# Patient Record
Sex: Male | Born: 1950 | Race: White | Hispanic: No | Marital: Single | Smoking: Former smoker
Health system: Southern US, Community
[De-identification: ages and names within clinical notes are randomized; demographics above are authoritative.]

## PROBLEM LIST (undated history)

## (undated) DIAGNOSIS — R7303 Prediabetes: Secondary | ICD-10-CM

## (undated) DIAGNOSIS — M1990 Unspecified osteoarthritis, unspecified site: Secondary | ICD-10-CM

## (undated) DIAGNOSIS — R270 Ataxia, unspecified: Secondary | ICD-10-CM

## (undated) DIAGNOSIS — E785 Hyperlipidemia, unspecified: Secondary | ICD-10-CM

## (undated) DIAGNOSIS — G43909 Migraine, unspecified, not intractable, without status migrainosus: Secondary | ICD-10-CM

## (undated) DIAGNOSIS — E119 Type 2 diabetes mellitus without complications: Secondary | ICD-10-CM

## (undated) DIAGNOSIS — I10 Essential (primary) hypertension: Secondary | ICD-10-CM

## (undated) DIAGNOSIS — M542 Cervicalgia: Secondary | ICD-10-CM

## (undated) DIAGNOSIS — N189 Chronic kidney disease, unspecified: Secondary | ICD-10-CM

## (undated) DIAGNOSIS — H9190 Unspecified hearing loss, unspecified ear: Secondary | ICD-10-CM

## (undated) DIAGNOSIS — Z860100 Personal history of colon polyps, unspecified: Secondary | ICD-10-CM

## (undated) HISTORY — DX: Chronic kidney disease, unspecified: N18.9

## (undated) HISTORY — DX: Unspecified osteoarthritis, unspecified site: M19.90

## (undated) HISTORY — DX: Type 2 diabetes mellitus without complications: E11.9

## (undated) HISTORY — DX: Ataxia, unspecified: R27.0

## (undated) HISTORY — DX: Hyperlipidemia, unspecified: E78.5

## (undated) HISTORY — DX: Personal history of colonic polyps: Z86.010

## (undated) HISTORY — PX: CHOLECYSTECTOMY: SHX55

## (undated) HISTORY — DX: Migraine, unspecified, not intractable, without status migrainosus: G43.909

## (undated) HISTORY — DX: Personal history of colon polyps, unspecified: Z86.0100

---

## 2016-12-29 ENCOUNTER — Encounter (HOSPITAL_COMMUNITY): Payer: Self-pay | Admitting: Emergency Medicine

## 2016-12-29 ENCOUNTER — Emergency Department (HOSPITAL_COMMUNITY): Payer: Self-pay

## 2016-12-29 ENCOUNTER — Emergency Department (HOSPITAL_COMMUNITY)
Admission: EM | Admit: 2016-12-29 | Discharge: 2016-12-30 | Disposition: A | Discharge status: Home / Self Care | Payer: Self-pay | Attending: Emergency Medicine | Admitting: Emergency Medicine

## 2016-12-29 DIAGNOSIS — Y9289 Other specified places as the place of occurrence of the external cause: Secondary | ICD-10-CM | POA: Insufficient documentation

## 2016-12-29 DIAGNOSIS — W109XXA Fall (on) (from) unspecified stairs and steps, initial encounter: Secondary | ICD-10-CM | POA: Insufficient documentation

## 2016-12-29 DIAGNOSIS — S43004A Unspecified dislocation of right shoulder joint, initial encounter: Secondary | ICD-10-CM

## 2016-12-29 DIAGNOSIS — Y999 Unspecified external cause status: Secondary | ICD-10-CM | POA: Insufficient documentation

## 2016-12-29 DIAGNOSIS — S43014A Anterior dislocation of right humerus, initial encounter: Secondary | ICD-10-CM | POA: Insufficient documentation

## 2016-12-29 DIAGNOSIS — R52 Pain, unspecified: Secondary | ICD-10-CM

## 2016-12-29 DIAGNOSIS — Y939 Activity, unspecified: Secondary | ICD-10-CM | POA: Insufficient documentation

## 2016-12-29 MED ORDER — BUPIVACAINE HCL (PF) 0.25 % IJ SOLN
10.0000 mL | Freq: Once | INTRAMUSCULAR | Status: AC
  Administered 2016-12-29: 10 mL
  Filled 2016-12-29: qty 30

## 2016-12-29 MED ORDER — FENTANYL CITRATE (PF) 100 MCG/2ML IJ SOLN
100.0000 ug | Freq: Once | INTRAMUSCULAR | Status: AC
  Administered 2016-12-29: 100 ug via INTRAVENOUS
  Filled 2016-12-29: qty 2

## 2016-12-29 MED ORDER — FENTANYL CITRATE (PF) 100 MCG/2ML IJ SOLN
50.0000 ug | Freq: Once | INTRAMUSCULAR | Status: AC
  Administered 2016-12-29: 50 ug via INTRAVENOUS
  Filled 2016-12-29: qty 2

## 2016-12-29 MED ORDER — LIDOCAINE HCL (PF) 1 % IJ SOLN
10.0000 mL | Freq: Once | INTRAMUSCULAR | Status: AC
  Administered 2016-12-29: 10 mL via INTRADERMAL
  Filled 2016-12-29: qty 10

## 2016-12-29 NOTE — ED Triage Notes (Signed)
Patient fell this evening onto concrete floor.  He did hit his head, no LOC, full recall.  No dizziness before the fall, he missed the last step on the stair case.  Patient has abrasion on his forehead.  Right shoulder is dislocated with pain.

## 2016-12-29 NOTE — ED Notes (Signed)
ED Provider at bedside. 

## 2016-12-30 MED ORDER — HYDROCODONE-ACETAMINOPHEN 5-325 MG PO TABS: 1.0000 | ORAL_TABLET | Freq: Four times a day (QID) | ORAL | 0 refills | Status: DC | PRN

## 2016-12-30 NOTE — ED Provider Notes (Signed)
[facility] DEPT Provider Note   CSN: [id number] Arrival date & time: 12/29/16  2000     History   Chief Complaint Chief Complaint  Patient presents with  . Shoulder Pain    HPI Austin Richards is a 66 y.o. male.  HPI Patient presents to the emergency department with right shoulder injury following a fall.  The patient missed a step and fell onto his right shoulder.  Patient did not have any other injuries.  Patient did not lose consciousness.  This was a witnessed fall by a family member.  Patient did not take any medications prior to arrival.  Patient states that movement and palpation make the pain worse History reviewed. No pertinent past medical history.  There are no active problems to display for this patient.   Past Surgical History:  Procedure Laterality Date  . CHOLECYSTECTOMY         Home Medications    Prior to Admission medications   Not on File    Family History History reviewed. No pertinent family history.  Social History Social History  Substance Use Topics  . Smoking status: Never Smoker  . Smokeless tobacco: Never Used  . Alcohol use Not on file     Allergies   Codeine and Penicillins   Review of Systems Review of Systems All other systems negative except as documented in the HPI. All pertinent positives and negatives as reviewed in the HPI.  Physical Exam Updated Vital Signs BP 136/80   Pulse 85   Temp 98 F (36.7 C) (Oral)   Resp 18   SpO2 98%   Physical Exam  Constitutional: He is oriented to person, place, and time. He appears well-developed and well-nourished. No distress.  HENT:  Head: Normocephalic and atraumatic.  Eyes: Pupils are equal, round, and reactive to light.  Pulmonary/Chest: Effort normal.  Musculoskeletal:       Right shoulder: He exhibits decreased range of motion, tenderness, bony tenderness and deformity.  Neurological: He is alert and oriented to person, place, and time.  Skin: Skin is  warm and dry.  Psychiatric: He has a normal mood and affect.  Nursing note and vitals reviewed.    ED Treatments / Results  Labs (all labs ordered are listed, but only abnormal results are displayed) Labs Reviewed - No data to display  EKG  EKG Interpretation None       Radiology Dg Shoulder Right  Result Date: 12/29/2016 CLINICAL DATA:  [place], limited range of motion with pain EXAM: RIGHT SHOULDER - 2+ VIEW COMPARISON:  None. FINDINGS: The right lung apex is clear. The [facility] joint appears intact. There is anterior dislocation of the right humeral head with respect glenoid fossa. There is no obvious fracture. IMPRESSION: Anterior, inferior dislocation of the right humeral head with respect to the glenoid fossa Electronically Signed   By: Donavan Foil M.D.   On: 12/29/2016 21:47   Dg Shoulder Right Portable  Result Date: 12/29/2016 CLINICAL DATA:  Post reduction of right humeral head dislocation EXAM: PORTABLE RIGHT SHOULDER COMPARISON:  12/29/2016 FINDINGS: Satisfactory reduction of the dislocated humeral head is now noted. The humeral head articulates with the glenoid. There is a small triangular ossific density off the undersurface of the acromion suspicious for minimally displaced fracture. IMPRESSION: Satisfactory reduction of the dislocated right humeral head. Small triangular ossific density adjacent to the undersurface of the acromion is noted which may reflect a tiny fracture fragment possibly off the acromion. Electronically Signed   By: Shanon Brow  Randel Pigg M.D.   On: 12/29/2016 23:39    Procedures Procedures (including critical care time)  Medications Ordered in ED Medications  fentaNYL (SUBLIMAZE) injection 100 mcg (100 mcg Intravenous Given 12/29/16 2254)  bupivacaine (PF) (MARCAINE) 0.25 % injection 10 mL (10 mLs Infiltration Given by Other 12/29/16 2255)  lidocaine (PF) (XYLOCAINE) 1 % injection 10 mL (10 mLs Intradermal Given by Other 12/29/16 2254)  fentaNYL (SUBLIMAZE) injection 50  mcg (50 mcg Intravenous Given 12/29/16 2311)     Initial Impression / Assessment and Plan / ED Course  I have reviewed the triage vital signs and the nursing notes.  Pertinent labs & imaging results that were available during my care of the patient were reviewed by me and considered in my medical decision making (see chart for details).    Reduction of dislocation Date/Time: 12:12 AM Performed by: Brent General Authorized by: Brent General Consent: Verbal consent obtained. Risks and benefits: risks, benefits and alternatives were discussed Consent given by: patient Required items: required blood products, implants, devices, and special equipment available Time out: Immediately prior to procedure a "time out" was called to verify the correct patient, procedure, equipment, support staff and site/side marked as required.  Patient sedated: Joint injection of Marcaine and lidocaine  Vitals: Vital signs were monitored during sedation. Patient tolerance: Patient tolerated the procedure well with no immediate complications. Joint: Right shoulder Reduction technique: Traction and external rotation     Final Clinical Impressions(s) / ED Diagnoses   Final diagnoses:  Pain    New Prescriptions New Prescriptions   No medications on file     Dalia Heading, PA-C 12/30/16 0013    Duffy Bruce, MD 12/30/16 1606

## 2016-12-30 NOTE — Discharge Instructions (Signed)
Follow-up with the orthopedist provided.  Return here as needed °

## 2017-02-18 DIAGNOSIS — M25511 Pain in right shoulder: Secondary | ICD-10-CM | POA: Diagnosis not present

## 2017-02-18 DIAGNOSIS — S43004A Unspecified dislocation of right shoulder joint, initial encounter: Secondary | ICD-10-CM | POA: Diagnosis not present

## 2017-03-19 ENCOUNTER — Other Ambulatory Visit: Payer: Self-pay | Admitting: Surgery

## 2017-03-19 DIAGNOSIS — S43014D Anterior dislocation of right humerus, subsequent encounter: Secondary | ICD-10-CM | POA: Diagnosis not present

## 2017-03-19 DIAGNOSIS — M75121 Complete rotator cuff tear or rupture of right shoulder, not specified as traumatic: Secondary | ICD-10-CM | POA: Insufficient documentation

## 2017-04-09 ENCOUNTER — Ambulatory Visit
Admission: RE | Admit: 2017-04-09 | Discharge: 2017-04-09 | Disposition: A | Discharge status: Home / Self Care | Payer: Medicare Other | Source: Ambulatory Visit | Attending: Surgery | Admitting: Surgery

## 2017-04-09 DIAGNOSIS — S43014D Anterior dislocation of right humerus, subsequent encounter: Secondary | ICD-10-CM | POA: Insufficient documentation

## 2017-04-09 DIAGNOSIS — M75121 Complete rotator cuff tear or rupture of right shoulder, not specified as traumatic: Secondary | ICD-10-CM | POA: Insufficient documentation

## 2017-04-09 DIAGNOSIS — M75101 Unspecified rotator cuff tear or rupture of right shoulder, not specified as traumatic: Secondary | ICD-10-CM | POA: Diagnosis not present

## 2017-04-09 DIAGNOSIS — X58XXXD Exposure to other specified factors, subsequent encounter: Secondary | ICD-10-CM | POA: Insufficient documentation

## 2017-04-20 DIAGNOSIS — M7581 Other shoulder lesions, right shoulder: Secondary | ICD-10-CM | POA: Insufficient documentation

## 2017-07-30 ENCOUNTER — Telehealth: Payer: Self-pay | Admitting: Family Medicine

## 2017-07-30 ENCOUNTER — Ambulatory Visit (INDEPENDENT_AMBULATORY_CARE_PROVIDER_SITE_OTHER): Payer: Medicare Other | Admitting: Family Medicine

## 2017-07-30 ENCOUNTER — Encounter: Payer: Self-pay | Admitting: Family Medicine

## 2017-07-30 VITALS — BP 140/90 | HR 88 | Temp 98.2°F | Ht 67.0 in | Wt 175.6 lb

## 2017-07-30 DIAGNOSIS — E781 Pure hyperglyceridemia: Secondary | ICD-10-CM

## 2017-07-30 DIAGNOSIS — E119 Type 2 diabetes mellitus without complications: Secondary | ICD-10-CM | POA: Diagnosis not present

## 2017-07-30 DIAGNOSIS — G8929 Other chronic pain: Secondary | ICD-10-CM

## 2017-07-30 DIAGNOSIS — M25511 Pain in right shoulder: Secondary | ICD-10-CM | POA: Diagnosis not present

## 2017-07-30 DIAGNOSIS — R030 Elevated blood-pressure reading, without diagnosis of hypertension: Secondary | ICD-10-CM

## 2017-07-30 DIAGNOSIS — R7303 Prediabetes: Secondary | ICD-10-CM | POA: Insufficient documentation

## 2017-07-30 DIAGNOSIS — R270 Ataxia, unspecified: Secondary | ICD-10-CM

## 2017-07-30 DIAGNOSIS — I10 Essential (primary) hypertension: Secondary | ICD-10-CM | POA: Insufficient documentation

## 2017-07-30 DIAGNOSIS — E785 Hyperlipidemia, unspecified: Secondary | ICD-10-CM

## 2017-07-30 DIAGNOSIS — M542 Cervicalgia: Secondary | ICD-10-CM | POA: Diagnosis not present

## 2017-07-30 LAB — COMPREHENSIVE METABOLIC PANEL
ALBUMIN: 4.6 g/dL (ref 3.5–5.2)
ALT: 35 U/L (ref 0–53)
AST: 23 U/L (ref 0–37)
Alkaline Phosphatase: 82 U/L (ref 39–117)
BUN: 22 mg/dL (ref 6–23)
CHLORIDE: 101 meq/L (ref 96–112)
CO2: 26 meq/L (ref 19–32)
Calcium: 9.8 mg/dL (ref 8.4–10.5)
Creatinine, Ser: 1.26 mg/dL (ref 0.40–1.50)
GFR: 61.05 mL/min (ref >60.00)
Glucose, Bld: 104 mg/dL — ABNORMAL HIGH (ref 70–99)
POTASSIUM: 4.1 meq/L (ref 3.5–5.1)
SODIUM: 136 meq/L (ref 135–145)
Total Bilirubin: 1.3 mg/dL — ABNORMAL HIGH (ref 0.2–1.2)
Total Protein: 7.3 g/dL (ref 6.0–8.3)

## 2017-07-30 LAB — LDL CHOLESTEROL, DIRECT: Direct LDL: 54 mg/dL

## 2017-07-30 LAB — LIPID PANEL
CHOL/HDL RATIO: 11
CHOLESTEROL: 314 mg/dL — AB (ref 0–200)
HDL: 28.8 mg/dL — ABNORMAL LOW (ref >39.00)
Triglycerides: 1591 mg/dL — ABNORMAL HIGH (ref 0.0–149.0)

## 2017-07-30 LAB — MICROALBUMIN / CREATININE URINE RATIO
CREATININE, U: 123.1 mg/dL
MICROALB UR: 2 mg/dL — AB (ref 0.0–1.9)
MICROALB/CREAT RATIO: 1.6 mg/g (ref 0.0–30.0)

## 2017-07-30 LAB — HEMOGLOBIN A1C: HEMOGLOBIN A1C: 5.8 % (ref 4.6–6.5)

## 2017-07-30 NOTE — Assessment & Plan Note (Signed)
Offered physical therapy. Patient declined Prior MRI reviewed. This will be scanned into chart. Patient will monitor.

## 2017-07-30 NOTE — Assessment & Plan Note (Signed)
Potentially has a history of hypertension though unsure. Elevated today. He'll check at home daily and return in 1 week for BP check with nursing. Consider starting medication if still elevated.

## 2017-07-30 NOTE — Telephone Encounter (Signed)
Attempted to contact both numbers listed. Patient's triglycerides are quite elevated. I wanted to discuss treatment with fenofibrate. We'll attempt to call tomorrow.

## 2017-07-30 NOTE — Patient Instructions (Signed)
Nice to see you We will refer you to orthopedics and neurology. We'll check lab work today and contact you with the results. Please start checking your blood pressure at home and return in 1 week for repeat blood pressure check.

## 2017-07-30 NOTE — Assessment & Plan Note (Signed)
Patient with ataxia issues. Has undergone evaluation through neurology. This is done 4 years ago. We'll get him set up with neurology here to see if any further evaluation is needed.

## 2017-07-30 NOTE — Progress Notes (Signed)
Tommi Rumps, MD Phone: [phone]  Austin Richards is a 66 y.o. male who presents today for new patient visit.  Balance issues: Patient has had ataxia issues for greater than 5 years. Was evaluated by his prior PCP with MRI brain that the patient reports was normal. He saw neurology and had EMG studies done. These results will be scanned into the chart. They did reveal abnormalities consistent with acute right L5 and S1 radiculopathy. Also L5 and S1 on the left as well. 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 He walks with a broad-based gait.  Patient has chronic neck pain on the right side. Bothers him when he rotates it. Feels like a pinch in his neck. No numbness or weakness.  Patient notes right shoulder issues. He dislocated it a number of months ago has been followed by orthopedics who are recommending surgery. The patient and his sister are requesting a second opinion.  Patient also possibly has a history of diabetes, hyperlipidemia, and hypertension for which he was on medication previously though has been off of medication for some time now.  Active Ambulatory Problems    Diagnosis Date Noted  . Diabetes mellitus without complication ([place]) 99/35/7017  . Hyperlipidemia 07/30/2017  . 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 Right shoulder pain 07/30/2017  . Chronic neck pain 07/30/2017  . Elevated BP without diagnosis of hypertension 07/30/2017   Resolved Ambulatory Problems    Diagnosis Date Noted  . No Resolved Ambulatory Problems   Past Medical History:  Diagnosis Date  . Arthritis   . Chronic kidney disease   . Diabetes mellitus without complication ([place])   . Hx of colonic polyp   . Hyperlipidemia   . Migraine     Family History  Problem Relation Age of Onset  . Adopted: Yes    Social History   Social History  . Marital status: Single    Spouse name: N/A  . Number of children:  N/A  . Years of education: N/A   Occupational History  . Not on file.   Social History Main Topics  . Smoking status: Never Smoker  . Smokeless tobacco: Never Used  . Alcohol use No  . Drug use: No  . Sexual activity: Not on file   Other Topics Concern  . Not on file   Social History Narrative  . No narrative on file    ROS  General:  Negative for nexplained weight loss, fever Skin: Negative for new or changing mole, sore that won't heal HEENT: Negative for trouble hearing, trouble seeing, ringing in ears, mouth sores, hoarseness, change in voice, dysphagia. CV:  Negative for chest pain, dyspnea, edema, palpitations Resp: Negative for cough, dyspnea, hemoptysis GI: Negative for nausea, vomiting, diarrhea, constipation, abdominal pain, melena, hematochezia. GU: Negative for dysuria, incontinence, urinary hesitance, hematuria, vaginal or penile discharge, polyuria, sexual difficulty, lumps in testicle or breasts MSK: Negative for muscle cramps or aches, joint pain or swelling Neuro: Negative for headaches, weakness, numbness, dizziness, passing out/fainting Psych: Negative for depression, anxiety, memory problems  Objective  Physical Exam Vitals:   07/30/17 1426 07/30/17 1505  BP: (!) 152/80 140/90  Pulse: 88   Temp: 98.2 F (36.8 C)   SpO2: 98%     BP Readings from Last 3 Encounters:  07/30/17 140/90  12/30/16 123/71   Wt Readings from Last 3 Encounters:  07/30/17 175 lb 9.6 oz (79.7 kg)    Physical Exam  Constitutional:  No distress.  HENT:  Head: Normocephalic and atraumatic.  Mouth/Throat: Oropharynx is clear and moist. No oropharyngeal exudate.  Eyes: Pupils are equal, round, and reactive to light. Conjunctivae are normal.  Neck: Normal range of motion.  Cardiovascular: Normal rate, regular rhythm and normal heart sounds.   Pulmonary/Chest: Effort normal and breath sounds normal.  Abdominal: Soft. Bowel sounds are normal. He exhibits no distension.  There is no tenderness. There is no rebound and no guarding.  Musculoskeletal: He exhibits no edema.  Neurological: He is alert.  CN 2-12 intact, 5/5 strength in bilateral biceps, triceps, grip, quads, hamstrings, plantar and dorsiflexion, sensation to light touch intact in bilateral UE and LE, broad-based gait  Skin: Skin is warm and dry. He is not diaphoretic.  Psychiatric: Mood and affect normal.   decreased range of motion right shoulder on abduction, internal rotation, and external rotation, discomfort in all of these planes of motion, mild tenderness posterior right shoulder, left shoulder with full range of motion with no discomfort   Assessment/Plan:   Diabetes mellitus without complication ([place]) History of this. Check A1c.  Ataxia Patient with ataxia issues. Has undergone evaluation through neurology. This is done 4 years ago. We'll get him set up with neurology here to see if any further evaluation is needed.  Hyperlipidemia Check lipid panel.  Right shoulder pain Recent history of dislocation. MRI with complete tear of supraspinatus and infraspinatus tendons. We'll refer to orthopedics in [place] for second opinion.  Chronic neck pain Offered physical therapy. Patient declined Prior MRI reviewed. This will be scanned into chart. Patient will monitor.  Elevated BP without diagnosis of hypertension Potentially has a history of hypertension though unsure. Elevated today. He'll check at home daily and return in 1 week for BP check with nursing. Consider starting medication if still elevated.   Orders Placed This Encounter  Procedures  . Comp Met (CMET)  . Lipid Profile  . HgB A1c  . Urine Microalbumin w/creat. ratio  . Ambulatory referral to Neurology    Referral Priority:   Routine    Referral Type:   Consultation    Referral Reason:   Specialty Services Required    Requested Specialty:   Neurology    Number of Visits Requested:   1  . Ambulatory referral to  Orthopedic Surgery    Referral Priority:   Routine    Referral Type:   Surgical    Referral Reason:   Specialty Services Required    Requested Specialty:   Orthopedic Surgery    Number of Visits Requested:   1    No orders of the defined types were placed in this encounter.    Tommi Rumps, MD [place]

## 2017-07-30 NOTE — Assessment & Plan Note (Signed)
History of this. Check A1c.

## 2017-07-30 NOTE — Assessment & Plan Note (Signed)
Check lipid panel  

## 2017-07-30 NOTE — Assessment & Plan Note (Signed)
Recent history of dislocation. MRI with complete tear of supraspinatus and infraspinatus tendons. We'll refer to orthopedics in [place] for second opinion.

## 2017-07-31 NOTE — Telephone Encounter (Signed)
Attempted to contact again. No answer. Will call on Monday. Triglycerides quite elevated. Patient would benefit from treatment with fenofibrate. Bilirubin slightly elevated as well. Needs to be rechecked in several weeks. A1c is in the prediabetic range. Needs to work on diet and exercise. Please inform the patient of this. Thanks.

## 2017-08-02 ENCOUNTER — Telehealth: Payer: Self-pay | Admitting: Family Medicine

## 2017-08-02 MED ORDER — FENOFIBRATE 48 MG PO TABS: 48.0000 mg | ORAL_TABLET | Freq: Every day | ORAL | 1 refills | Status: DC

## 2017-08-02 NOTE — Telephone Encounter (Signed)
Sent to pharmacy 

## 2017-08-02 NOTE — Telephone Encounter (Signed)
Patient advised of below , lab appointment scheduled.   Would like to try fenofibrate.

## 2017-08-02 NOTE — Telephone Encounter (Signed)
Copied from [place] [id number]. Topic: Quick Communication - Lab Results >> Aug 02, 2017 11:26 AM Johna Sheriff, CMA wrote: Called patient to inform them of  lab results. When patient returns call, triage nurse not disclose results. Route call to me please . Thanks.

## 2017-08-02 NOTE — Addendum Note (Signed)
Addended by: Leone Haven on: 08/02/2017 03:22 PM   Modules accepted: Orders

## 2017-08-10 ENCOUNTER — Telehealth: Payer: Self-pay

## 2017-08-10 ENCOUNTER — Ambulatory Visit (INDEPENDENT_AMBULATORY_CARE_PROVIDER_SITE_OTHER): Payer: Medicare Other

## 2017-08-10 ENCOUNTER — Ambulatory Visit: Payer: Medicare Other

## 2017-08-10 DIAGNOSIS — R030 Elevated blood-pressure reading, without diagnosis of hypertension: Secondary | ICD-10-CM

## 2017-08-10 DIAGNOSIS — M25511 Pain in right shoulder: Secondary | ICD-10-CM | POA: Diagnosis not present

## 2017-08-10 NOTE — Progress Notes (Addendum)
Patient comes in today for blood pressure check. Patient was seen by Dr.Sonnenebrg on 07/30/2017 and had a blood pressure of 140/90 and pulse of 88.Patient brought in readings for Dr.Sonnenebrg to review.Patients blood pressure today in left arm is 122/80 and pulse of 83.  Reviewed above information.  Blood pressure improved. Continue to follow.    Dr Nicki Reaper

## 2017-08-10 NOTE — Telephone Encounter (Signed)
Patient came in today for blood pressure check and would like to know if he you could add labs to check for MS and other involuntary muscle diseases to his labs on December 5th, patient unsure of name of disease. Patient just wants this for screening he reports no symptoms of this.Patient was adopted and is unsure if there is any family history of this. Patient aware Dr.Sonnenebrg is out of the office.

## 2017-08-24 NOTE — Telephone Encounter (Signed)
There are no recommended screening tests for those issues. If he is not having symptoms we would not evaluate for this. Thanks.

## 2017-08-25 NOTE — Telephone Encounter (Signed)
Left message to return call, ok for PEC to speak to patient or sister to inform her of message below

## 2017-08-26 ENCOUNTER — Ambulatory Visit: Payer: Medicare Other | Admitting: Neurology

## 2017-08-30 NOTE — Telephone Encounter (Signed)
Unable to reach patient.

## 2017-09-01 ENCOUNTER — Other Ambulatory Visit: Payer: Self-pay | Admitting: Family Medicine

## 2017-09-01 ENCOUNTER — Other Ambulatory Visit (INDEPENDENT_AMBULATORY_CARE_PROVIDER_SITE_OTHER): Payer: Medicare Other

## 2017-09-01 DIAGNOSIS — E781 Pure hyperglyceridemia: Secondary | ICD-10-CM

## 2017-09-01 DIAGNOSIS — Z5181 Encounter for therapeutic drug level monitoring: Secondary | ICD-10-CM

## 2017-09-01 LAB — LIPID PANEL
Cholesterol: 243 mg/dL — ABNORMAL HIGH (ref 0–200)
HDL: 34.5 mg/dL — AB (ref >39.00)
Total CHOL/HDL Ratio: 7
Triglycerides: 648 mg/dL — ABNORMAL HIGH (ref 0.0–149.0)

## 2017-09-01 LAB — LDL CHOLESTEROL, DIRECT: Direct LDL: 84 mg/dL

## 2017-09-01 MED ORDER — FENOFIBRATE 54 MG PO TABS: 54.0000 mg | ORAL_TABLET | Freq: Every day | ORAL | 1 refills | Status: DC

## 2017-09-13 ENCOUNTER — Encounter: Payer: Self-pay | Admitting: Family Medicine

## 2017-09-13 ENCOUNTER — Ambulatory Visit (INDEPENDENT_AMBULATORY_CARE_PROVIDER_SITE_OTHER): Payer: Medicare Other | Admitting: Family Medicine

## 2017-09-13 VITALS — BP 140/88 | HR 81 | Temp 97.8°F | Wt 178.4 lb

## 2017-09-13 DIAGNOSIS — K429 Umbilical hernia without obstruction or gangrene: Secondary | ICD-10-CM | POA: Diagnosis not present

## 2017-09-13 DIAGNOSIS — M25511 Pain in right shoulder: Secondary | ICD-10-CM | POA: Diagnosis not present

## 2017-09-13 DIAGNOSIS — Z01818 Encounter for other preprocedural examination: Secondary | ICD-10-CM | POA: Diagnosis not present

## 2017-09-13 DIAGNOSIS — N529 Male erectile dysfunction, unspecified: Secondary | ICD-10-CM

## 2017-09-13 MED ORDER — SILDENAFIL CITRATE 25 MG PO TABS: 25.0000 mg | ORAL_TABLET | Freq: Every day | ORAL | 0 refills | Status: DC | PRN

## 2017-09-13 NOTE — Progress Notes (Signed)
Austin Rumps, MD Phone: [phone]  Austin Richards is a 66 y.o. male who presents today for preop exam.  Patient uses sign language though he declined an interpreter.  Is due for right shoulder replacement likely in January per his report.  He notes no chest pain or shortness of breath.  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  He has never had a heart attack.  No history of irregular heartbeat or stroke.  No issues with anesthesia in the past.  No history of seizures.  No history of thyroid disease.  No angina.  No liver disease.  No asthma or chronic bronchitis.  He has had diabetes though this has been well controlled without medication.  He does report some erectile dysfunction issues.  He has occasional difficulty maintaining an erection and has issues with the erection not being quite as strong as prior.  He has been on Viagra in the past and notes this did work well for him though he had to take half a dose.  He notes he does ejaculate.  He states he practices safe sex practices.  Umbilical hernia: Notes this may have been present for some time now.  He notes no pain.  No signs of incarceration or obstruction.  Social History   Tobacco Use  Smoking Status Never Smoker  Smokeless Tobacco Never Used     ROS see history of present illness  Objective  Physical Exam Vitals:   09/13/17 1131 09/13/17 1201  BP: (!) 156/86 140/88  Pulse: 81   Temp: 97.8 F (36.6 C)   SpO2: 98%     BP Readings from Last 3 Encounters:  09/13/17 140/88  07/30/17 140/90  12/30/16 123/71   Wt Readings from Last 3 Encounters:  09/13/17 178 lb 6.4 oz (80.9 kg)  07/30/17 175 lb 9.6 oz (79.7 kg)    Physical Exam  Constitutional: No distress.  HENT:  Head: Normocephalic and atraumatic.  Mouth/Throat: Oropharynx is clear and moist.  Cardiovascular: Normal rate, regular rhythm and normal heart sounds.  Pulmonary/Chest: Effort normal and  breath sounds normal.  Abdominal: Soft. Bowel sounds are normal. He exhibits no distension. There is no tenderness. There is no rebound and no guarding.  Umbilical hernia noted that is fully reducible with no tenderness  Genitourinary:  Genitourinary Comments: Normal circumcised penis, normal scrotum, normal testicles, normal vas deferens, normal epididymis, no inguinal hernias  Musculoskeletal: He exhibits no edema.  Neurological: He is alert. Gait normal.  Skin: Skin is warm and dry. He is not diaphoretic.     Assessment/Plan: Please see individual problem list.  Right shoulder pain Patient is due for right shoulder replacement sometime next year.  He presents for presurgical clearance.  Patient is low risk for cardiovascular perioperative complications given Lyndel Safe perioperative risk percentage of 0.2%.  Patient is low risk for medical complications as well based on NSQIP risk calculator.  He will need a CBC prior to surgery.  Patient is aware of this and he will contact us to schedule this closer to the time of his surgery.  Erectile dysfunction Patient with erectile dysfunction that has responded to Viagra in the past.  We will trial a low-dose of Viagra to see if this is beneficial.  Discussed if he were to develop any cardiac symptoms during intercourse he would need to cease his activities and be evaluated.  Also advised that if he were to go to the emergency  room or be evaluated by EMS he would need to let them know if he had taken this medication.  Umbilical hernia without obstruction and without gangrene No signs of obstruction or incarceration.  Discussed seeing a surgeon though he opted to defer this until after his shoulder is taken care of.  Advised of hernia return precautions.   Austin Richards was seen today for surgical clearance.  Diagnoses and all orders for this visit:  Pre-op exam -     Cancel: CBC -     CBC; Future  Right shoulder pain, unspecified chronicity  Erectile  dysfunction, unspecified erectile dysfunction type  Umbilical hernia without obstruction and without gangrene  Other orders -     sildenafil (VIAGRA) 25 MG tablet; Take 1 tablet (25 mg total) by mouth daily as needed for erectile dysfunction.    Orders Placed This Encounter  Procedures  . CBC    Standing Status:   Future    Standing Expiration Date:   09/13/2018    Meds ordered this encounter  Medications  . sildenafil (VIAGRA) 25 MG tablet    Sig: Take 1 tablet (25 mg total) by mouth daily as needed for erectile dysfunction.    Dispense:  10 tablet    Refill:  0     Austin Rumps, MD [place]

## 2017-09-13 NOTE — Assessment & Plan Note (Signed)
Patient is due for right shoulder replacement sometime next year.  He presents for presurgical clearance.  Patient is low risk for cardiovascular perioperative complications given Lyndel Safe perioperative risk percentage of 0.2%.  Patient is low risk for medical complications as well based on NSQIP risk calculator.  He will need a CBC prior to surgery.  Patient is aware of this and he will contact us to schedule this closer to the time of his surgery.

## 2017-09-13 NOTE — Patient Instructions (Addendum)
Nice to see you. We will complete your surgical clearance.  We need to check a CBC. I sent in a low-dose of Viagra for you to try.  If you are to develop chest pain or shortness of breath while using this you need to be evaluated.  If you ever need to go to the emergency room or call EMS you need to let them know you have been taking this.

## 2017-09-13 NOTE — Assessment & Plan Note (Signed)
No signs of obstruction or incarceration.  Discussed seeing a surgeon though he opted to defer this until after his shoulder is taken care of.  Advised of hernia return precautions.

## 2017-09-13 NOTE — Assessment & Plan Note (Signed)
Patient with erectile dysfunction that has responded to Viagra in the past.  We will trial a low-dose of Viagra to see if this is beneficial.  Discussed if he were to develop any cardiac symptoms during intercourse he would need to cease his activities and be evaluated.  Also advised that if he were to go to the emergency room or be evaluated by EMS he would need to let them know if he had taken this medication.

## 2017-10-04 ENCOUNTER — Other Ambulatory Visit (INDEPENDENT_AMBULATORY_CARE_PROVIDER_SITE_OTHER): Payer: Medicare Other

## 2017-10-04 DIAGNOSIS — Z01818 Encounter for other preprocedural examination: Secondary | ICD-10-CM | POA: Diagnosis not present

## 2017-10-04 DIAGNOSIS — E781 Pure hyperglyceridemia: Secondary | ICD-10-CM | POA: Diagnosis not present

## 2017-10-04 DIAGNOSIS — Z5181 Encounter for therapeutic drug level monitoring: Secondary | ICD-10-CM

## 2017-10-04 LAB — CBC
HEMATOCRIT: 49.2 % (ref 39.0–52.0)
Hemoglobin: 17 g/dL (ref 13.0–17.0)
MCHC: 34.5 g/dL (ref 30.0–36.0)
MCV: 88.8 fl (ref 78.0–100.0)
Platelets: 264 10*3/uL (ref 150.0–400.0)
RBC: 5.54 Mil/uL (ref 4.22–5.81)
RDW: 13.6 % (ref 11.5–15.5)
WBC: 6.4 10*3/uL (ref 4.0–10.5)

## 2017-10-04 LAB — COMPREHENSIVE METABOLIC PANEL
ALBUMIN: 4.6 g/dL (ref 3.5–5.2)
ALT: 38 U/L (ref 0–53)
AST: 23 U/L (ref 0–37)
Alkaline Phosphatase: 76 U/L (ref 39–117)
BUN: 19 mg/dL (ref 6–23)
CALCIUM: 9.7 mg/dL (ref 8.4–10.5)
CHLORIDE: 102 meq/L (ref 96–112)
CO2: 27 mEq/L (ref 19–32)
Creatinine, Ser: 1.43 mg/dL (ref 0.40–1.50)
GFR: 52.72 mL/min — ABNORMAL LOW (ref >60.00)
Glucose, Bld: 132 mg/dL — ABNORMAL HIGH (ref 70–99)
POTASSIUM: 3.9 meq/L (ref 3.5–5.1)
SODIUM: 139 meq/L (ref 135–145)
Total Bilirubin: 1.8 mg/dL — ABNORMAL HIGH (ref 0.2–1.2)
Total Protein: 7.2 g/dL (ref 6.0–8.3)

## 2017-10-04 LAB — LIPID PANEL
CHOL/HDL RATIO: 8
CHOLESTEROL: 242 mg/dL — AB (ref 0–200)
HDL: 30.2 mg/dL — AB (ref >39.00)

## 2017-10-04 LAB — LDL CHOLESTEROL, DIRECT: Direct LDL: 91 mg/dL

## 2017-10-05 NOTE — H&P (Signed)
  PREOPERATIVE H&P  Chief Complaint: DJD RIGHT SHOULDER  HPI: Austin Richards is a 67 y.o. male who presents for preoperative history and physical with a diagnosis of DJD RIGHT SHOULDER. Symptoms are rated as moderate to severe, and have been worsening.  This is significantly impairing activities of daily living.  He has elected for surgical management.   Past Medical History:  Diagnosis Date  . Arthritis   . Chronic kidney disease   . Diabetes mellitus without complication ([place])   . Hx of colonic polyp   . Hyperlipidemia   . Migraine    Past Surgical History:  Procedure Laterality Date  . CHOLECYSTECTOMY     Social History   Socioeconomic History  . Marital status: Single    Spouse name: Not on file  . Number of children: Not on file  . Years of education: Not on file  . Highest education level: Not on file  Social Needs  . Financial resource strain: Not on file  . Food insecurity - worry: Not on file  . Food insecurity - inability: Not on file  . Transportation needs - medical: Not on file  . Transportation needs - non-medical: Not on file  Occupational History  . Not on file  Tobacco Use  . Smoking status: Never Smoker  . Smokeless tobacco: Never Used  Substance and Sexual Activity  . Alcohol use: No  . Drug use: No  . Sexual activity: Not on file  Other Topics Concern  . Not on file  Social History Narrative  . Not on file   Family History  Adopted: Yes   Allergies  Allergen Reactions  . Codeine Nausea And Vomiting  . Penicillins Nausea And Vomiting   Prior to Admission medications   Medication Sig Start Date End Date Taking? Authorizing Provider  fenofibrate 54 MG tablet Take 1 tablet (54 mg total) by mouth daily. 09/01/17   Leone Haven, MD  meloxicam (MOBIC) 7.5 MG tablet Take 7.5 mg by mouth 2 (two) times daily as needed. for pain 09/07/17   [provider]  sildenafil (VIAGRA) 25 MG tablet Take 1 tablet (25 mg total) by mouth  daily as needed for erectile dysfunction. 09/13/17   Leone Haven, MD     Positive ROS: All other systems have been reviewed and were otherwise negative with the exception of those mentioned in the HPI and as above.  Physical Exam: General: Alert, no acute distress Cardiovascular: No pedal edema Respiratory: No cyanosis, no use of accessory musculature GI: No organomegaly, abdomen is soft and non-tender Skin: No lesions in the area of chief complaint Neurologic: Sensation intact distally Psychiatric: Patient is competent for consent with normal mood and affect Lymphatic: No axillary or cervical lymphadenopathy  MUSCULOSKELETAL: R shoulder, painful ROM, cuff 3/5, wwp distally,   Assessment: DJD RIGHT SHOULDER  Plan: Plan for Procedure(s): TOTAL SHOULDER ARTHROPLASTY  The risks benefits and alternatives were discussed with the patient including but not limited to the risks of nonoperative treatment, versus surgical intervention including infection, bleeding, nerve injury,  blood clots, cardiopulmonary complications, morbidity, mortality, among others, and they were willing to proceed.   Hiram Gash, MD  10/05/2017 7:40 AM

## 2017-10-09 ENCOUNTER — Other Ambulatory Visit: Payer: Self-pay | Admitting: Family Medicine

## 2017-10-09 DIAGNOSIS — R17 Unspecified jaundice: Secondary | ICD-10-CM

## 2017-10-13 ENCOUNTER — Other Ambulatory Visit: Payer: Self-pay | Admitting: Family Medicine

## 2017-10-13 DIAGNOSIS — E781 Pure hyperglyceridemia: Secondary | ICD-10-CM

## 2017-10-13 MED ORDER — FENOFIBRATE 120 MG PO TABS: 120.0000 mg | ORAL_TABLET | Freq: Every day | ORAL | 1 refills | Status: DC

## 2017-10-13 MED ORDER — OMEGA-3-ACID ETHYL ESTERS 1 G PO CAPS: 2.0000 g | ORAL_CAPSULE | Freq: Two times a day (BID) | ORAL | 3 refills | Status: DC

## 2017-10-14 ENCOUNTER — Other Ambulatory Visit (HOSPITAL_COMMUNITY): Payer: Medicare Other

## 2017-10-21 ENCOUNTER — Ambulatory Visit (INDEPENDENT_AMBULATORY_CARE_PROVIDER_SITE_OTHER): Payer: Medicare Other | Admitting: Neurology

## 2017-10-21 ENCOUNTER — Encounter: Payer: Self-pay | Admitting: Neurology

## 2017-10-21 VITALS — BP 149/83 | HR 85 | Ht 67.0 in | Wt 179.0 lb

## 2017-10-21 DIAGNOSIS — G3281 Cerebellar ataxia in diseases classified elsewhere: Secondary | ICD-10-CM | POA: Diagnosis not present

## 2017-10-21 DIAGNOSIS — R269 Unspecified abnormalities of gait and mobility: Secondary | ICD-10-CM
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
## 2017-10-22 ENCOUNTER — Other Ambulatory Visit: Payer: Medicare Other

## 2017-10-22 ENCOUNTER — Telehealth: Payer: Self-pay | Admitting: Neurology

## 2017-10-22 LAB — RPR: RPR: NONREACTIVE

## 2017-10-22 LAB — SEDIMENTATION RATE: Sed Rate: 2 mm/hr (ref 0–30)

## 2017-10-22 LAB — HIV ANTIBODY (ROUTINE TESTING W REFLEX): HIV SCREEN 4TH GENERATION: NONREACTIVE

## 2017-10-22 LAB — FOLATE: Folate: >20 ng/mL (ref >3.0)

## 2017-10-22 LAB — TSH: TSH: 2.48 u[IU]/mL (ref 0.450–4.500)

## 2017-10-22 LAB — VITAMIN D 25 HYDROXY (VIT D DEFICIENCY, FRACTURES): VIT D 25 HYDROXY: 22.6 ng/mL — AB (ref 30.0–100.0)

## 2017-10-22 LAB — VITAMIN B12: VITAMIN B 12: 996 pg/mL (ref 232–1245)

## 2017-10-22 LAB — CK: Total CK: 184 U/L (ref 24–204)

## 2017-10-22 LAB — C-REACTIVE PROTEIN: CRP: 2.1 mg/L (ref 0.0–4.9)

## 2017-10-22 NOTE — Telephone Encounter (Signed)
Please call patient's sister, he has hearing loss, extensive laboratory evaluation showed mildly decreased vitamin D 22, he should take over-the-counter vitamin D3 supplement 1000 units daily.  Rest of the laboratory evaluation were within normal limits.

## 2017-10-25 ENCOUNTER — Encounter: Payer: Self-pay | Admitting: *Deleted

## 2017-10-25 ENCOUNTER — Telehealth: Payer: Self-pay

## 2017-10-25 NOTE — Telephone Encounter (Signed)
Please advise 

## 2017-10-25 NOTE — Telephone Encounter (Signed)
Copied from [place] [id number]. Topic: Inquiry >> Oct 25, 2017 12:49 PM Pricilla Handler wrote: Reason for CRM: Patient's sister called stating that patient's insurance company will not pay for the medication fenofibrate 120 MG TABS, bacause the medication is not in his plan. Patient's sister has requested for Dr. Caryl Bis to change the medication to one covered by patient's insurance, so that patient may obtain it.        Thank You!!!

## 2017-10-25 NOTE — Telephone Encounter (Signed)
I will forward to Chrys Racer to see if she can look at the medicare formulary and help determine what type of fibrate is covered by his insurance.

## 2017-10-25 NOTE — Telephone Encounter (Signed)
Left patient a detailed message, with results and recommended vitamin D supplement, on his sister's voicemail (pt is deaf - ok per DPR).  Provided our number to call back with any questions.

## 2017-10-26 ENCOUNTER — Telehealth: Payer: Self-pay | Admitting: Pharmacist

## 2017-10-26 NOTE — Telephone Encounter (Signed)
Called patient and patient's contact "Woody", no answer. Called to inquire about insurance formulary. Per Medicare.gov query, patient has [employer] Rx Saver (PDP) and also has full extra help. Fenofibrate is on patient's formulary at a tier 3 medication, cost of medication should be no more than $3.40/month for generic meds or $8.50/month for brand medications.   Called patient's pharmacy and they report that the fenofibrate 120 mg was picked up on 10/14/17 for 30 day supply.    Carlean Jews, Pharm.D., BCPS PGY2 Ambulatory Care Pharmacy Resident Phone: [phone]

## 2017-10-26 NOTE — Telephone Encounter (Signed)
Called to ask about insurance coverage. No answer, left HIPAA-compliant VM requesting he return my call.

## 2017-10-27 ENCOUNTER — Telehealth: Payer: Self-pay | Admitting: Pharmacist

## 2017-10-27 MED ORDER — FENOFIBRATE MICRONIZED 130 MG PO CAPS: 130.0000 mg | ORAL_CAPSULE | Freq: Every day | ORAL | 3 refills | Status: DC

## 2017-10-27 NOTE — Telephone Encounter (Signed)
See other phone note

## 2017-10-27 NOTE — Addendum Note (Signed)
Addended by: Caryl Bis English [place] G on: 10/27/2017 12:27 PM   Modules accepted: Orders

## 2017-10-27 NOTE — Telephone Encounter (Signed)
Patient's sister calls re: insurance coverage of fenofibrate. She states that she received a letter that the fenofibrate 120 mg dose would not be covered.   Insurance formulary doses include 130 mg, 134 mg, 135 mg, 140 mg, 150 mg, 160 mg.   Recommend change product to fenofibrate 130 mg daily.

## 2017-10-27 NOTE — Telephone Encounter (Signed)
New fenofibrate sent to pharmacy.

## 2017-10-28 NOTE — Pre-Procedure Instructions (Signed)
[place]  10/28/2017      [id number] [place], [place] - [address] [address] Phone: [phone] Fax: [phone]    Your procedure is scheduled on Feb. 13  Report to [place] at 630 A.M.  Call this number if you have problems the morning of surgery:  [phone]   Remember:  Do not eat food or drink liquids after midnight.  Take these medicines the morning of surgery with A SIP OF WATER None  Stop taking aspirin, BC's, Goody's, Herbal medications, Fish Oil, Ibuprofen, Advil, Motrin, Aleve, vitamins, Meloxicam (Mobic)   Do not wear jewelry, make-up or nail polish.  Do not wear lotions, powders, or perfumes, or deodorant.  Do not shave 48 hours prior to surgery.  Men may shave face and neck.  Do not bring valuables to the hospital.  [facility] is not responsible for any belongings or valuables.  Contacts, dentures or bridgework may not be worn into surgery.  Leave your suitcase in the car.  After surgery it may be brought to your room.  For patients admitted to the hospital, discharge time will be determined by your treatment team.  Patients discharged the day of surgery will not be allowed to drive home.    Special instructions:  [name] - Preparing for Surgery  Before surgery, you can play an important role.  Because skin is not sterile, your skin needs to be as free of germs as possible.  You can reduce the number of germs on you skin by washing with CHG (chlorahexidine gluconate) soap before surgery.  CHG is an antiseptic cleaner which kills germs and bonds with the skin to continue killing germs even after washing.  Please DO NOT use if you have an allergy to CHG or antibacterial soaps.  If your skin becomes reddened/irritated stop using the CHG and inform your nurse when you arrive at Short Stay.  Do not shave (including legs and underarms) for at least 48 hours prior to the first CHG shower.   You may shave your face.  Please follow these instructions carefully:   1.  Shower with CHG Soap the night before surgery and the  morning of Surgery.  2.  If you choose to wash your hair, wash your hair first as usual with your  normal shampoo.  3.  After you shampoo, rinse your hair and body thoroughly to remove the Shampoo.  4.  Use CHG as you would any other liquid soap.  You can apply chg directly  to the skin and wash gently with scrungie or a clean washcloth.  5.  Apply the CHG Soap to your body ONLY FROM THE NECK DOWN.  Do not use on open wounds or open sores.  Avoid contact with your eyes,  ears, mouth and genitals (private parts).  Wash genitals (private parts)  with your normal soap.  6.  Wash thoroughly, paying special attention to the area where your surgery will be performed.  7.  Thoroughly rinse your body with warm water from the neck down.  8.  DO NOT shower/wash with your normal soap after using and rinsing off the CHG Soap.  9.  Pat yourself dry with a clean towel.            10.  Wear clean pajamas.            11.  Place clean sheets on your bed the  night of your first shower and do not sleep with pets.  Day of Surgery  Do not apply any lotions/deoderants the morning of surgery.  Please wear clean clothes to the hospital/surgery center.     Please read over the following fact sheets that you were given. Pain Booklet, Coughing and Deep Breathing, MRSA Information and Surgical Site Infection Prevention

## 2017-10-29 ENCOUNTER — Encounter (HOSPITAL_COMMUNITY)
Admission: RE | Admit: 2017-10-29 | Discharge: 2017-10-29 | Disposition: A | Discharge status: Home / Self Care | Payer: Medicare Other | Source: Ambulatory Visit | Attending: Orthopaedic Surgery | Admitting: Orthopaedic Surgery

## 2017-10-29 ENCOUNTER — Other Ambulatory Visit: Payer: Self-pay

## 2017-10-29 ENCOUNTER — Encounter (HOSPITAL_COMMUNITY): Payer: Self-pay

## 2017-10-29 DIAGNOSIS — E119 Type 2 diabetes mellitus without complications: Secondary | ICD-10-CM | POA: Insufficient documentation

## 2017-10-29 DIAGNOSIS — Z01812 Encounter for preprocedural laboratory examination: Secondary | ICD-10-CM | POA: Insufficient documentation

## 2017-10-29 HISTORY — DX: Prediabetes: R73.03

## 2017-10-29 LAB — BASIC METABOLIC PANEL
Anion gap: 11 (ref 5–15)
BUN: 22 mg/dL — ABNORMAL HIGH (ref 6–20)
CALCIUM: 9.3 mg/dL (ref 8.9–10.3)
CHLORIDE: 107 mmol/L (ref 101–111)
CO2: 20 mmol/L — AB (ref 22–32)
Creatinine, Ser: 1.41 mg/dL — ABNORMAL HIGH (ref 0.61–1.24)
GFR calc non Af Amer: 51 mL/min — ABNORMAL LOW (ref >60)
GFR, EST AFRICAN AMERICAN: 59 mL/min — AB (ref >60)
Glucose, Bld: 95 mg/dL (ref 65–99)
Potassium: 4.6 mmol/L (ref 3.5–5.1)
Sodium: 138 mmol/L (ref 135–145)

## 2017-10-29 LAB — CBC
HCT: 44.2 % (ref 39.0–52.0)
Hemoglobin: 15.4 g/dL (ref 13.0–17.0)
MCH: 30.4 pg (ref 26.0–34.0)
MCHC: 34.8 g/dL (ref 30.0–36.0)
MCV: 87.2 fL (ref 78.0–100.0)
Platelets: 246 10*3/uL (ref 150–400)
RBC: 5.07 MIL/uL (ref 4.22–5.81)
RDW: 13.6 % (ref 11.5–15.5)
WBC: 6.6 10*3/uL (ref 4.0–10.5)

## 2017-10-29 LAB — SURGICAL PCR SCREEN
MRSA, PCR: NEGATIVE
Staphylococcus aureus: NEGATIVE

## 2017-10-29 NOTE — Progress Notes (Addendum)
PCP - Gari Crown Cardiologist - denies any cardiac history no heart tests  Patient does not check CBG at home, states he is Pre-diabetic but eats well and exercises so has not been put on any diabetic medicines. Last A1c 5.8. Per Levada Dy with Anesthesia, no need to draw A1c today, Glucose on BMET was 95.   Patient denies shortness of breath, fever, cough and chest pain at PAT appointment   Patient verbalized understanding of instructions that were given to them at the PAT appointment. Patient was also instructed that they will need to review over the PAT instructions again at home before surgery.

## 2017-10-29 NOTE — Progress Notes (Signed)
Pt is deaf, but is able to read lips well. Pt states he was ok to have PAT appointment without interpreter, but would like to request sign language interpretor for DOS.   Interpretor requested and confirmed for DOS.

## 2017-10-29 NOTE — Pre-Procedure Instructions (Signed)
[place]  10/29/2017      [id number] - [place], [place] - [address] [address] Phone: [phone] Fax: [phone]    Your procedure is scheduled on Feb. 13  Report to [place] at 630 A.M.  Call this number if you have problems the morning of surgery:  [phone]   Remember:  Do not eat food or drink liquids after midnight.  Take these medicines the morning of surgery with A SIP OF WATER None  Stop taking aspirin, BC's, Goody's, Herbal medications, Fish Oil, Ibuprofen, Advil, Motrin, Aleve, vitamins, Meloxicam (Mobic)      How to Manage Your Diabetes Before and After Surgery  Why is it important to control my blood sugar before and after surgery? . Improving blood sugar levels before and after surgery helps healing and can limit problems. . A way of improving blood sugar control is eating a healthy diet by: o  Eating less sugar and carbohydrates o  Increasing activity/exercise o  Talking with your doctor about reaching your blood sugar goals . High blood sugars (greater than 180 mg/dL) can raise your risk of infections and slow your recovery, so you will need to focus on controlling your diabetes during the weeks before surgery. . Make sure that the doctor who takes care of your diabetes knows about your planned surgery including the date and location.  How do I manage my blood sugar before surgery? . Check your blood sugar at least 4 times a day, starting 2 days before surgery, to make sure that the level is not too high or low. o Check your blood sugar the morning of your surgery when you wake up and every 2 hours until you get to the Short Stay unit. . If your blood sugar is less than 70 mg/dL, you will need to treat for low blood sugar: o Do not take insulin. o Treat a low blood sugar (less than 70 mg/dL) with  cup of clear juice (cranberry or apple), 4 glucose tablets, OR glucose gel. Recheck  blood sugar in 15 minutes after treatment (to make sure it is greater than 70 mg/dL). If your blood sugar is not greater than 70 mg/dL on recheck, call [phone] o  for further instructions. . Report your blood sugar to the short stay nurse when you get to Short Stay.  . If you are admitted to the hospital after surgery: o Your blood sugar will be checked by the staff and you will probably be given insulin after surgery (instead of oral diabetes medicines) to make sure you have good blood sugar levels. o The goal for blood sugar control after surgery is 80-180 mg/dL.              Do not wear jewelry, make-up or nail polish.  Do not wear lotions, powders, or perfumes, or deodorant.  Do not shave 48 hours prior to surgery.  Men may shave face and neck.  Do not bring valuables to the hospital.  [facility] is not responsible for any belongings or valuables.  Contacts, dentures or bridgework may not be worn into surgery.  Leave your suitcase in the car.  After surgery it may be brought to your room.  For patients admitted to the hospital, discharge time will be determined by your treatment team.  Patients discharged the day of surgery will not be allowed to drive home.     [facility]- Preparing  For Surgery  Before surgery, you can play an important role. Because skin is not sterile, your skin needs to be as free of germs as possible. You can reduce the number of germs on your skin by washing with CHG (chlorahexidine gluconate) Soap before surgery.  CHG is an antiseptic cleaner which kills germs and bonds with the skin to continue killing germs even after washing.  Please do not use if you have an allergy to CHG or antibacterial soaps. If your skin becomes reddened/irritated stop using the CHG.  Do not shave (including legs and underarms) for at least 48 hours prior to first CHG shower. It is OK to shave your face.  Please follow these instructions carefully.   1. Shower the  NIGHT BEFORE SURGERY and the MORNING OF SURGERY with CHG.   2. If you chose to wash your hair, wash your hair first as usual with your normal shampoo.  3. After you shampoo, rinse your hair and body thoroughly to remove the shampoo.  4. Use CHG as you would any other liquid soap. You can apply CHG directly to the skin and wash gently with a scrungie or a clean washcloth.   5. Apply the CHG Soap to your body ONLY FROM THE NECK DOWN.  Do not use on open wounds or open sores. Avoid contact with your eyes, ears, mouth and genitals (private parts). Wash Face and genitals (private parts)  with your normal soap.  6. Wash thoroughly, paying special attention to the area where your surgery will be performed.  7. Thoroughly rinse your body with warm water from the neck down.  8. DO NOT shower/wash with your normal soap after using and rinsing off the CHG Soap.  9. Pat yourself dry with a CLEAN TOWEL.  10. Wear CLEAN PAJAMAS to bed the night before surgery, wear comfortable clothes the morning of surgery  11. Place CLEAN SHEETS on your bed the night of your first shower and DO NOT SLEEP WITH PETS.    Day of Surgery: Do not apply any deodorants/lotions. Please wear clean clothes to the hospital/surgery center.         Please read over the following fact sheets that you were given. Pain Booklet, Coughing and Deep Breathing, MRSA Information and Surgical Site Infection Prevention

## 2017-11-01 ENCOUNTER — Encounter: Payer: Self-pay | Admitting: Family Medicine

## 2017-11-01 ENCOUNTER — Ambulatory Visit (INDEPENDENT_AMBULATORY_CARE_PROVIDER_SITE_OTHER): Payer: Medicare Other | Admitting: Family Medicine

## 2017-11-01 VITALS — BP 152/90 | HR 89 | Temp 98.2°F | Wt 176.6 lb

## 2017-11-01 DIAGNOSIS — E781 Pure hyperglyceridemia: Secondary | ICD-10-CM

## 2017-11-01 DIAGNOSIS — I10 Essential (primary) hypertension: Secondary | ICD-10-CM | POA: Diagnosis not present

## 2017-11-01 DIAGNOSIS — R0989 Other specified symptoms and signs involving the circulatory and respiratory systems: Secondary | ICD-10-CM | POA: Diagnosis not present

## 2017-11-01 DIAGNOSIS — E119 Type 2 diabetes mellitus without complications: Secondary | ICD-10-CM | POA: Diagnosis not present

## 2017-11-01 DIAGNOSIS — M25511 Pain in right shoulder: Secondary | ICD-10-CM

## 2017-11-01 MED ORDER — AMLODIPINE BESYLATE 5 MG PO TABS: 5.0000 mg | ORAL_TABLET | Freq: Every day | ORAL | 3 refills | Status: DC

## 2017-11-01 NOTE — Assessment & Plan Note (Signed)
Blood pressure remains elevated.  Decision was made to start on medication.  This was sent to his pharmacy.  Needs recheck in 1 month.  This will be set up for the patient.

## 2017-11-01 NOTE — Assessment & Plan Note (Addendum)
ABIs ordered. 

## 2017-11-01 NOTE — Assessment & Plan Note (Signed)
Check A1c. 

## 2017-11-01 NOTE — Assessment & Plan Note (Signed)
He will continue to follow with orthopedic surgery.

## 2017-11-01 NOTE — Assessment & Plan Note (Signed)
The plan was to check the patient's lipid panel today though this was not relayed to the lab staff and thus was not drawn from his future orders.  We will contact the patient to get this set up for recheck.

## 2017-11-01 NOTE — Progress Notes (Signed)
  Tommi Rumps, MD Phone: [phone]  Austin Richards is a 67 y.o. male who presents today for follow-up.  Hypertension: Typically 989-211H systolically.  No chest pain or shortness of breath.  Hypertriglyceridemia: On fenofibrate.  No myalgias.  He did not start the lovaza.  He continues on fenofibrate 120.  He is going to undergo right shoulder replacement in several weeks.  He is taking meloxicam currently for this.  Taking Tylenol as well.  History of diabetes: Most recent A1c in the prediabetic range.  He denies polyuria and polydipsia.  Social History   Tobacco Use  Smoking Status Former Smoker  Smokeless Tobacco Never Used  Tobacco Comment   "smoked in my 23s for a short period of time"     ROS see history of present illness  Objective  Physical Exam Vitals:   11/01/17 1537  BP: (!) 152/90  Pulse: 89  Temp: 98.2 F (36.8 C)  SpO2: 97%    BP Readings from Last 3 Encounters:  11/01/17 (!) 152/90  10/29/17 (!) 150/85  10/21/17 (!) 149/83   Wt Readings from Last 3 Encounters:  11/01/17 176 lb 9.6 oz (80.1 kg)  10/29/17 178 lb 3.2 oz (80.8 kg)  10/21/17 179 lb (81.2 kg)    Physical Exam  Constitutional: No distress.  Cardiovascular: Normal rate, regular rhythm and normal heart sounds.  Pulmonary/Chest: Effort normal and breath sounds normal.  Musculoskeletal: He exhibits no edema.  Neurological: He is alert. Gait normal.  Skin: Skin is warm and dry. He is not diaphoretic.   Diabetic Foot Exam - Simple   Simple Foot Form Diabetic Foot exam was performed with the following findings:  Yes 11/01/2017  4:01 PM  Visual Inspection No deformities, no ulcerations, no other skin breakdown bilaterally:  Yes Sensation Testing Intact to touch and monofilament testing bilaterally:  Yes Pulse Check See comments:  Yes Comments Difficult to palpate PT and DP pulses, feet do feel warm      Assessment/Plan: Please see individual problem  list.  Diabetes mellitus without complication (HCC) Check A1c.  Hypertriglyceridemia The plan was to check the patient's lipid panel today though this was not relayed to the lab staff and thus was not drawn from his future orders.  We will contact the patient to get this set up for recheck.  Hypertension Blood pressure remains elevated.  Decision was made to start on medication.  This was sent to his pharmacy.  Needs recheck in 1 month.  This will be set up for the patient.  Right shoulder pain He will continue to follow with orthopedic surgery.  Decreased pedal pulses ABIs ordered.   Orders Placed This Encounter  Procedures  . HgB A1c    Meds ordered this encounter  Medications  . amLODipine (NORVASC) 5 MG tablet    Sig: Take 1 tablet (5 mg total) by mouth daily.    Dispense:  90 tablet    Refill:  [place], MD [place]

## 2017-11-01 NOTE — Patient Instructions (Signed)
Nice to see you. We will check lab work today and contact you with the results. 

## 2017-11-02 ENCOUNTER — Ambulatory Visit
Admission: RE | Admit: 2017-11-02 | Discharge: 2017-11-02 | Disposition: A | Discharge status: Home / Self Care | Payer: Medicare Other | Source: Ambulatory Visit | Attending: Neurology | Admitting: Neurology

## 2017-11-02 DIAGNOSIS — G3281 Cerebellar ataxia in diseases classified elsewhere: Secondary | ICD-10-CM

## 2017-11-02 LAB — HEMOGLOBIN A1C: HEMOGLOBIN A1C: 6 % (ref 4.6–6.5)

## 2017-11-02 NOTE — Progress Notes (Signed)
Patients sister notified and patient scheduled for lab tomorrow 11/03/17. She states she will call back after the surgery and make the bp check appmt to see how he is feeling.

## 2017-11-03 ENCOUNTER — Other Ambulatory Visit (INDEPENDENT_AMBULATORY_CARE_PROVIDER_SITE_OTHER): Payer: Medicare Other

## 2017-11-03 DIAGNOSIS — R17 Unspecified jaundice: Secondary | ICD-10-CM

## 2017-11-03 DIAGNOSIS — E781 Pure hyperglyceridemia: Secondary | ICD-10-CM

## 2017-11-03 LAB — LIPID PANEL
CHOL/HDL RATIO: 6
CHOLESTEROL: 167 mg/dL (ref 0–200)
HDL: 28.6 mg/dL — ABNORMAL LOW (ref >39.00)
NonHDL: 138
TRIGLYCERIDES: 324 mg/dL — AB (ref 0.0–149.0)
VLDL: 64.8 mg/dL — AB (ref 0.0–40.0)

## 2017-11-03 LAB — BILIRUBIN, FRACTIONATED(TOT/DIR/INDIR)
Bilirubin, Direct: 0.2 mg/dL (ref 0.0–0.2)
Indirect Bilirubin: 0.7 mg/dL (calc) (ref 0.2–1.2)
Total Bilirubin: 0.9 mg/dL (ref 0.2–1.2)

## 2017-11-03 LAB — LDL CHOLESTEROL, DIRECT: Direct LDL: 73 mg/dL

## 2017-11-06 ENCOUNTER — Ambulatory Visit
Admission: RE | Admit: 2017-11-06 | Discharge: 2017-11-06 | Disposition: A | Discharge status: Home / Self Care | Payer: Medicare Other | Source: Ambulatory Visit | Attending: Neurology | Admitting: Neurology

## 2017-11-06 DIAGNOSIS — G3281 Cerebellar ataxia in diseases classified elsewhere: Secondary | ICD-10-CM | POA: Diagnosis not present

## 2017-11-08 ENCOUNTER — Telehealth: Payer: Self-pay | Admitting: *Deleted

## 2017-11-08 NOTE — Telephone Encounter (Signed)
Spoke to his sister on HIPAA (pt is deaf) - she is aware of results.

## 2017-11-08 NOTE — Telephone Encounter (Signed)
-----   Message from Marcial Pacas, MD sent at 11/08/2017  4:36 PM EST ----- Please call pt for mild age-related changes in the MRI of the brain, no acute abnormality.[name] Kitchen

## 2017-11-09 MED ORDER — TRANEXAMIC ACID 1000 MG/10ML IV SOLN
1000.0000 mg | INTRAVENOUS | Status: AC
  Administered 2017-11-10: 1000 mg via INTRAVENOUS
  Filled 2017-11-09: qty 1100

## 2017-11-10 ENCOUNTER — Inpatient Hospital Stay (HOSPITAL_COMMUNITY): Payer: Medicare Other | Admitting: Anesthesiology

## 2017-11-10 ENCOUNTER — Encounter (HOSPITAL_COMMUNITY): Payer: Self-pay | Admitting: *Deleted

## 2017-11-10 ENCOUNTER — Inpatient Hospital Stay (HOSPITAL_COMMUNITY)
Admission: RE | Admit: 2017-11-10 | Discharge: 2017-11-11 | DRG: 483 | Disposition: A | Discharge status: Home / Self Care | Payer: Medicare Other | Source: Ambulatory Visit | Attending: Orthopaedic Surgery | Admitting: Orthopaedic Surgery

## 2017-11-10 ENCOUNTER — Inpatient Hospital Stay (HOSPITAL_COMMUNITY): Payer: Medicare Other | Admitting: Emergency Medicine

## 2017-11-10 ENCOUNTER — Encounter (HOSPITAL_COMMUNITY)
Admission: RE | Disposition: A | Discharge status: Home / Self Care | Payer: Self-pay | Source: Ambulatory Visit | Attending: Orthopaedic Surgery

## 2017-11-10 ENCOUNTER — Inpatient Hospital Stay (HOSPITAL_COMMUNITY): Payer: Medicare Other

## 2017-11-10 DIAGNOSIS — M65811 Other synovitis and tenosynovitis, right shoulder: Secondary | ICD-10-CM | POA: Diagnosis present

## 2017-11-10 DIAGNOSIS — E118 Type 2 diabetes mellitus with unspecified complications: Secondary | ICD-10-CM | POA: Diagnosis present

## 2017-11-10 DIAGNOSIS — E1122 Type 2 diabetes mellitus with diabetic chronic kidney disease: Secondary | ICD-10-CM | POA: Diagnosis present

## 2017-11-10 DIAGNOSIS — M19011 Primary osteoarthritis, right shoulder: Principal | ICD-10-CM | POA: Diagnosis present

## 2017-11-10 DIAGNOSIS — M12811 Other specific arthropathies, not elsewhere classified, right shoulder: Secondary | ICD-10-CM | POA: Diagnosis present

## 2017-11-10 DIAGNOSIS — E119 Type 2 diabetes mellitus without complications: Secondary | ICD-10-CM | POA: Diagnosis not present

## 2017-11-10 DIAGNOSIS — Z87891 Personal history of nicotine dependence: Secondary | ICD-10-CM | POA: Diagnosis not present

## 2017-11-10 DIAGNOSIS — Q638 Other specified congenital malformations of kidney: Secondary | ICD-10-CM | POA: Diagnosis not present

## 2017-11-10 DIAGNOSIS — Z471 Aftercare following joint replacement surgery: Secondary | ICD-10-CM | POA: Diagnosis not present

## 2017-11-10 DIAGNOSIS — G8918 Other acute postprocedural pain: Secondary | ICD-10-CM | POA: Diagnosis not present

## 2017-11-10 DIAGNOSIS — Z96611 Presence of right artificial shoulder joint: Secondary | ICD-10-CM | POA: Diagnosis not present

## 2017-11-10 DIAGNOSIS — Z86010 Personal history of colonic polyps: Secondary | ICD-10-CM | POA: Diagnosis not present

## 2017-11-10 DIAGNOSIS — E785 Hyperlipidemia, unspecified: Secondary | ICD-10-CM | POA: Diagnosis present

## 2017-11-10 DIAGNOSIS — I10 Essential (primary) hypertension: Secondary | ICD-10-CM | POA: Diagnosis not present

## 2017-11-10 DIAGNOSIS — M75101 Unspecified rotator cuff tear or rupture of right shoulder, not specified as traumatic: Secondary | ICD-10-CM | POA: Diagnosis not present

## 2017-11-10 DIAGNOSIS — Z09 Encounter for follow-up examination after completed treatment for conditions other than malignant neoplasm: Secondary | ICD-10-CM

## 2017-11-10 HISTORY — PX: TOTAL SHOULDER ARTHROPLASTY: SHX126

## 2017-11-10 LAB — GLUCOSE, CAPILLARY: Glucose-Capillary: 236 mg/dL — ABNORMAL HIGH (ref 65–99)

## 2017-11-10 SURGERY — ARTHROPLASTY, SHOULDER, TOTAL
Anesthesia: Regional | Laterality: Right

## 2017-11-10 MED ORDER — LACTATED RINGERS IV SOLN
INTRAVENOUS | Status: DC | PRN
  Administered 2017-11-10: 09:00:00 via INTRAVENOUS

## 2017-11-10 MED ORDER — SUGAMMADEX SODIUM 200 MG/2ML IV SOLN
INTRAVENOUS | Status: AC
  Filled 2017-11-10: qty 2

## 2017-11-10 MED ORDER — PROPOFOL 10 MG/ML IV BOLUS
INTRAVENOUS | Status: AC
  Filled 2017-11-10: qty 20

## 2017-11-10 MED ORDER — ROCURONIUM BROMIDE 10 MG/ML (PF) SYRINGE
PREFILLED_SYRINGE | INTRAVENOUS | Status: AC
  Filled 2017-11-10: qty 5

## 2017-11-10 MED ORDER — METOCLOPRAMIDE HCL 5 MG PO TABS: 5.0000 mg | ORAL_TABLET | Freq: Three times a day (TID) | ORAL | Status: DC | PRN

## 2017-11-10 MED ORDER — EPHEDRINE 5 MG/ML INJ
INTRAVENOUS | Status: AC
  Filled 2017-11-10: qty 10

## 2017-11-10 MED ORDER — SODIUM CHLORIDE 0.9 % IR SOLN
Status: DC | PRN
  Administered 2017-11-10: 3000 mL

## 2017-11-10 MED ORDER — CEFAZOLIN SODIUM-DEXTROSE 1-4 GM/50ML-% IV SOLN: 1.0000 g | Freq: Four times a day (QID) | INTRAVENOUS | Status: DC

## 2017-11-10 MED ORDER — ONDANSETRON HCL 4 MG/2ML IJ SOLN
INTRAMUSCULAR | Status: AC
  Filled 2017-11-10: qty 2

## 2017-11-10 MED ORDER — PROPOFOL 10 MG/ML IV BOLUS
INTRAVENOUS | Status: DC | PRN
  Administered 2017-11-10: 150 mg via INTRAVENOUS

## 2017-11-10 MED ORDER — DIPHENHYDRAMINE HCL 12.5 MG/5ML PO ELIX: 12.5000 mg | ORAL_SOLUTION | ORAL | Status: DC | PRN

## 2017-11-10 MED ORDER — FENTANYL CITRATE (PF) 250 MCG/5ML IJ SOLN
INTRAMUSCULAR | Status: AC
  Filled 2017-11-10: qty 5

## 2017-11-10 MED ORDER — 0.9 % SODIUM CHLORIDE (POUR BTL) OPTIME
TOPICAL | Status: DC | PRN
  Administered 2017-11-10: 1000 mL

## 2017-11-10 MED ORDER — VANCOMYCIN HCL IN DEXTROSE 1-5 GM/200ML-% IV SOLN
1000.0000 mg | Freq: Two times a day (BID) | INTRAVENOUS | Status: AC
  Administered 2017-11-10 – 2017-11-11 (×2): 1000 mg via INTRAVENOUS
  Filled 2017-11-10 (×2): qty 200

## 2017-11-10 MED ORDER — CELECOXIB 200 MG PO CAPS
200.0000 mg | ORAL_CAPSULE | Freq: Two times a day (BID) | ORAL | Status: DC
  Administered 2017-11-10 – 2017-11-11 (×2): 200 mg via ORAL
  Filled 2017-11-10 (×2): qty 1

## 2017-11-10 MED ORDER — FENTANYL CITRATE (PF) 100 MCG/2ML IJ SOLN
INTRAMUSCULAR | Status: DC | PRN
  Administered 2017-11-10: 50 ug via INTRAVENOUS

## 2017-11-10 MED ORDER — ONDANSETRON HCL 4 MG/2ML IJ SOLN: 4.0000 mg | Freq: Four times a day (QID) | INTRAMUSCULAR | Status: DC | PRN

## 2017-11-10 MED ORDER — METOCLOPRAMIDE HCL 5 MG/ML IJ SOLN: 5.0000 mg | Freq: Three times a day (TID) | INTRAMUSCULAR | Status: DC | PRN

## 2017-11-10 MED ORDER — ONDANSETRON HCL 4 MG/2ML IJ SOLN
INTRAMUSCULAR | Status: DC | PRN
  Administered 2017-11-10: 4 mg via INTRAVENOUS

## 2017-11-10 MED ORDER — MIDAZOLAM HCL 5 MG/5ML IJ SOLN
INTRAMUSCULAR | Status: DC | PRN
  Administered 2017-11-10: 2 mg via INTRAVENOUS

## 2017-11-10 MED ORDER — HYDROMORPHONE HCL 1 MG/ML IJ SOLN: 0.2500 mg | INTRAMUSCULAR | Status: DC | PRN

## 2017-11-10 MED ORDER — DEXAMETHASONE SODIUM PHOSPHATE 10 MG/ML IJ SOLN
INTRAMUSCULAR | Status: AC
  Filled 2017-11-10: qty 1

## 2017-11-10 MED ORDER — EPHEDRINE SULFATE-NACL 50-0.9 MG/10ML-% IV SOSY
PREFILLED_SYRINGE | INTRAVENOUS | Status: DC | PRN
  Administered 2017-11-10 (×2): 5 mg via INTRAVENOUS

## 2017-11-10 MED ORDER — OXYCODONE HCL 5 MG PO TABS: 5.0000 mg | ORAL_TABLET | Freq: Once | ORAL | Status: DC | PRN

## 2017-11-10 MED ORDER — ROPIVACAINE HCL 5 MG/ML IJ SOLN
INTRAMUSCULAR | Status: DC | PRN
  Administered 2017-11-10: 30 mL via PERINEURAL

## 2017-11-10 MED ORDER — VANCOMYCIN HCL IN DEXTROSE 1-5 GM/200ML-% IV SOLN
1000.0000 mg | INTRAVENOUS | Status: AC
  Administered 2017-11-10: 1000 mg via INTRAVENOUS
  Filled 2017-11-10: qty 200

## 2017-11-10 MED ORDER — MORPHINE SULFATE (PF) 2 MG/ML IV SOLN: 2.0000 mg | INTRAVENOUS | Status: DC | PRN

## 2017-11-10 MED ORDER — ROCURONIUM BROMIDE 100 MG/10ML IV SOLN
INTRAVENOUS | Status: DC | PRN
  Administered 2017-11-10: 10 mg via INTRAVENOUS
  Administered 2017-11-10: 20 mg via INTRAVENOUS
  Administered 2017-11-10: 50 mg via INTRAVENOUS

## 2017-11-10 MED ORDER — DEXAMETHASONE SODIUM PHOSPHATE 10 MG/ML IJ SOLN
INTRAMUSCULAR | Status: DC | PRN
  Administered 2017-11-10: 10 mg via INTRAVENOUS

## 2017-11-10 MED ORDER — PHENYLEPHRINE HCL 10 MG/ML IJ SOLN
INTRAVENOUS | Status: DC | PRN
  Administered 2017-11-10: 40 ug/min via INTRAVENOUS

## 2017-11-10 MED ORDER — LIDOCAINE 2% (20 MG/ML) 5 ML SYRINGE
INTRAMUSCULAR | Status: AC
  Filled 2017-11-10: qty 5

## 2017-11-10 MED ORDER — OXYCODONE HCL 5 MG/5ML PO SOLN: 5.0000 mg | Freq: Once | ORAL | Status: DC | PRN

## 2017-11-10 MED ORDER — MIDAZOLAM HCL 2 MG/2ML IJ SOLN
INTRAMUSCULAR | Status: AC
  Filled 2017-11-10: qty 2

## 2017-11-10 MED ORDER — CHLORHEXIDINE GLUCONATE 4 % EX LIQD: 60.0000 mL | Freq: Once | CUTANEOUS | Status: DC

## 2017-11-10 MED ORDER — OXYCODONE HCL 5 MG PO TABS
10.0000 mg | ORAL_TABLET | ORAL | Status: DC | PRN
  Administered 2017-11-10 – 2017-11-11 (×2): 10 mg via ORAL
  Filled 2017-11-10 (×2): qty 2

## 2017-11-10 MED ORDER — PROMETHAZINE HCL 25 MG/ML IJ SOLN: 6.2500 mg | INTRAMUSCULAR | Status: DC | PRN

## 2017-11-10 MED ORDER — ONDANSETRON HCL 4 MG PO TABS: 4.0000 mg | ORAL_TABLET | Freq: Four times a day (QID) | ORAL | Status: DC | PRN

## 2017-11-10 MED ORDER — SUGAMMADEX SODIUM 200 MG/2ML IV SOLN
INTRAVENOUS | Status: DC | PRN
  Administered 2017-11-10: 200 mg via INTRAVENOUS

## 2017-11-10 MED ORDER — LIDOCAINE HCL (CARDIAC) 20 MG/ML IV SOLN
INTRAVENOUS | Status: DC | PRN
  Administered 2017-11-10: 50 mg via INTRAVENOUS

## 2017-11-10 MED ORDER — DOCUSATE SODIUM 100 MG PO CAPS
100.0000 mg | ORAL_CAPSULE | Freq: Two times a day (BID) | ORAL | Status: DC
  Administered 2017-11-10 – 2017-11-11 (×2): 100 mg via ORAL
  Filled 2017-11-10 (×2): qty 1

## 2017-11-10 MED ORDER — OXYCODONE HCL 5 MG PO TABS
5.0000 mg | ORAL_TABLET | ORAL | Status: DC | PRN
  Administered 2017-11-11: 5 mg via ORAL
  Filled 2017-11-10: qty 1

## 2017-11-10 MED ORDER — ACETAMINOPHEN 500 MG PO TABS
1000.0000 mg | ORAL_TABLET | Freq: Four times a day (QID) | ORAL | Status: AC
  Administered 2017-11-10 – 2017-11-11 (×2): 1000 mg via ORAL
  Filled 2017-11-10 (×2): qty 2

## 2017-11-10 MED ORDER — SUCCINYLCHOLINE CHLORIDE 200 MG/10ML IV SOSY
PREFILLED_SYRINGE | INTRAVENOUS | Status: AC
  Filled 2017-11-10: qty 10

## 2017-11-10 SURGICAL SUPPLY — 61 items
BASEPLATE GLENOSPHERE 25 STD (Miscellaneous) ×2 IMPLANT
BASEPLATE GLENOSPHERE 25MM STD (Miscellaneous) ×1 IMPLANT
BENZOIN TINCTURE PRP APPL 2/3 (GAUZE/BANDAGES/DRESSINGS) ×3 IMPLANT
BLADE SAW SAG 29X58X.64 (BLADE) ×3 IMPLANT
BLADE SAW SAG 73X25 THK (BLADE)
BLADE SAW SGTL 73X25 THK (BLADE) IMPLANT
CAP SHOULDER REVTOTAL 2 ×3 IMPLANT
CHLORAPREP W/TINT 26ML (MISCELLANEOUS) ×9 IMPLANT
CLOSURE WOUND 1/2 X4 (GAUZE/BANDAGES/DRESSINGS) ×1
COVER SURGICAL LIGHT HANDLE (MISCELLANEOUS) ×3 IMPLANT
DRAPE INCISE IOBAN 66X45 STRL (DRAPES) ×3 IMPLANT
DRAPE ORTHO SPLIT 77X108 STRL (DRAPES) ×4
DRAPE SURG ORHT 6 SPLT 77X108 (DRAPES) ×2 IMPLANT
DRAPE U-SHAPE 47X51 STRL (DRAPES) ×3 IMPLANT
DRSG AQUACEL AG ADV 3.5X 6 (GAUZE/BANDAGES/DRESSINGS) ×3 IMPLANT
ELECT REM PT RETURN 9FT ADLT (ELECTROSURGICAL) ×3
ELECTRODE REM PT RTRN 9FT ADLT (ELECTROSURGICAL) ×1 IMPLANT
GLOVE BIOGEL PI IND STRL 8 (GLOVE) ×1 IMPLANT
GLOVE BIOGEL PI INDICATOR 8 (GLOVE) ×2
GLOVE ECLIPSE 8.0 STRL XLNG CF (GLOVE) ×6 IMPLANT
GOWN STRL REUS W/ TWL LRG LVL3 (GOWN DISPOSABLE) ×2 IMPLANT
GOWN STRL REUS W/ TWL XL LVL3 (GOWN DISPOSABLE) ×2 IMPLANT
GOWN STRL REUS W/TWL LRG LVL3 (GOWN DISPOSABLE) ×4
GOWN STRL REUS W/TWL XL LVL3 (GOWN DISPOSABLE) ×4
GUIDEWIRE GLENOID 2.5X220 (WIRE) IMPLANT
HANDPIECE INTERPULSE COAX TIP (DISPOSABLE)
KIT BASIN OR (CUSTOM PROCEDURE TRAY) ×3 IMPLANT
KIT ROOM TURNOVER OR (KITS) ×3 IMPLANT
KIT STABILIZATION SHOULDER (MISCELLANEOUS) ×3 IMPLANT
MANIFOLD NEPTUNE II (INSTRUMENTS) ×3 IMPLANT
NEEDLE HYPO 25GX1X1/2 BEV (NEEDLE) IMPLANT
NEEDLE MAYO TROCAR (NEEDLE) ×3 IMPLANT
NS IRRIG 1000ML POUR BTL (IV SOLUTION) ×3 IMPLANT
PACK SHOULDER (CUSTOM PROCEDURE TRAY) ×3 IMPLANT
PAD ARMBOARD 7.5X6 YLW CONV (MISCELLANEOUS) ×6 IMPLANT
PERIPHERAL SCREW DRILL BIT IMPLANT
RESTRAINT HEAD UNIVERSAL NS (MISCELLANEOUS) ×3 IMPLANT
SCREW BONE 6.5X40 SM (Screw) ×3 IMPLANT
SET HNDPC FAN SPRY TIP SCT (DISPOSABLE) IMPLANT
SLING ARM IMMOBILIZER LRG (SOFTGOODS) ×3 IMPLANT
SPONGE LAP 18X18 X RAY DECT (DISPOSABLE) ×3 IMPLANT
STRIP CLOSURE SKIN 1/2X4 (GAUZE/BANDAGES/DRESSINGS) ×2 IMPLANT
SUCTION FRAZIER HANDLE 10FR (MISCELLANEOUS) ×2
SUCTION TUBE FRAZIER 10FR DISP (MISCELLANEOUS) ×1 IMPLANT
SUT ETHIBOND 2 V 37 (SUTURE) ×3 IMPLANT
SUT ETHIBOND NAB CT1 #1 30IN (SUTURE) ×3 IMPLANT
SUT FIBERWIRE #5 38 CONV NDL (SUTURE) ×18
SUT MNCRL AB 4-0 PS2 18 (SUTURE) ×3 IMPLANT
SUT MON AB 3-0 SH 27 (SUTURE) ×2
SUT MON AB 3-0 SH27 (SUTURE) ×1 IMPLANT
SUT VIC AB 0 CT1 18XCR BRD 8 (SUTURE) ×1 IMPLANT
SUT VIC AB 0 CT1 8-18 (SUTURE) ×2
SUT VIC AB 2-0 CT1 27 (SUTURE) ×2
SUT VIC AB 2-0 CT1 TAPERPNT 27 (SUTURE) ×1 IMPLANT
SUT VIC AB 3-0 FS2 27 (SUTURE) ×3 IMPLANT
SUTURE FIBERWR #5 38 CONV NDL (SUTURE) ×6 IMPLANT
TOWEL OR 17X24 6PK STRL BLUE (TOWEL DISPOSABLE) ×3 IMPLANT
TOWEL OR 17X26 10 PK STRL BLUE (TOWEL DISPOSABLE) ×3 IMPLANT
TOWER CARTRIDGE SMART MIX (DISPOSABLE) IMPLANT
TRAY FOLEY CATH SILVER 14FR (SET/KITS/TRAYS/PACK) IMPLANT
WATER STERILE IRR 1000ML POUR (IV SOLUTION) ×3 IMPLANT

## 2017-11-10 NOTE — Anesthesia Procedure Notes (Signed)
Procedure Name: Intubation Date/Time: 11/10/2017 8:55 AM Performed by: Gwyndolyn Saxon, CRNA Pre-anesthesia Checklist: Patient identified, Emergency Drugs available, Suction available, Patient being monitored and Timeout performed Patient Re-evaluated:Patient Re-evaluated prior to induction Oxygen Delivery Method: Circle system utilized Preoxygenation: Pre-oxygenation with 100% oxygen Induction Type: IV induction Ventilation: Mask ventilation without difficulty and Oral airway inserted - appropriate to patient size Laryngoscope Size: Sabra Heck and 2 Grade View: Grade I Tube type: Oral Tube size: 7.5 mm Number of attempts: 1 Placement Confirmation: ETT inserted through vocal cords under direct vision,  positive ETCO2,  CO2 detector and breath sounds checked- equal and bilateral Secured at: 22 cm Tube secured with: Tape Dental Injury: Teeth and Oropharynx as per pre-operative assessment

## 2017-11-10 NOTE — Interval H&P Note (Signed)
Discussed case, risks and benefits with patient again.  All questions answered, no change to history.  Dax Varkey MD  

## 2017-11-10 NOTE — Op Note (Addendum)
Orthopaedic Surgery Operative Note (CSN: [id number])  Cromwell  [birth year] Date of Surgery: 11/10/2017   Diagnoses:  Right rotator cuff arthropathy  Procedures: Right reverse total shoulder arthroplasty   Operative Finding Successful completion of planned procedure.  Good stability at completion of case.  Sub-conjoint scarring made finding the axillary nerve initially difficult but we were able to find it and demonstrate appropriate tug test.  Nerve intact at end of case.  No undue tension placed.    Post-operative plan: The patient will be NWB in sling.  The patient will be admitted overnight.  DVT prophylaxis not indicated in isolated upper extremity surgery patient with no specific risks factors.  Pain control with PRN pain medication preferring oral medicines.  Follow up plan will be scheduled in approximately 7 days for incision check and AP XR only.  PT to start after first visit.  Post-Op Diagnosis: Same Surgeons:Primary: Hiram Gash, MD Assistants:Brandon Ladene Artist Location: [facility] OR ROOM 03 Anesthesia: Choice Antibiotics: Vancomycin 1g due to ancef allergy Tourniquet time: * No tourniquets in log * Estimated Blood Loss: 478 Complications: None Specimens: None Implants: Implant Name Type Inv. Item Serial No. Manufacturer Lot No. LRB No. Used Action  BASEPLATE GLENOSPHERE 29FA STD - [id number] Miscellaneous BASEPLATE GLENOSPHERE 69GE STD [id number] [employer]  Right 1 Implanted  SCREW BONE 6.5X40MM SMALL - [id number] Screw SCREW BONE 6.5X40MM SMALL  [employer]  Right 1 Implanted  SCREW BONE 5.0 X 38    [employer]  Right 1 Implanted  SCREW BONE 5.0 X 22    [employer]  Right 1 Implanted  STANDARD GLENOSPHERE   [id number] [employer]  Right 1 Implanted  IMPLANT REVERSE SHOULDER 0X3.5 - [id number] Shoulder IMPLANT REVERSE SHOULDER 0X3.5 [id number] [employer]  Right 1 Implanted  INSERT HUMERAL 36X6MM 12.5DEG - [id number] Insert INSERT HUMERAL 36X6MM 12.5DEG  [id number] [employer]  Right 1 Implanted  STANDARD PTC HUMERAL STEM   [id number] [employer]  Right 1 Implanted    Indications for Surgery:   Perrion Diesel Monje is a 67 y.o. 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  Benefits and risks of operative and nonoperative management were discussed prior to surgery with patient/guardian(s) and informed consent form was completed.  Specific risks including infection, need for additional surgery, dislocation, axillary nerve injury and need for revision.   Procedure:   The patient was identified in the preoperative holding area where the surgical site was marked. The patient was taken to the OR where a procedural timeout was called and the above noted anesthesia was induced.  The patient was positioned beachchair on allen table with spider arm holder.  Preoperative antibiotics were dosed.  The patient's right arm was prepped and draped in the usual sterile fashion.  A second preoperative timeout was called.      Standard deltopectoral approach was performed with a #10 blade. We dissected down to the subcutaneous tissues and the cephalic vein was taken laterally with the deltoid. Clavipectoral fascia was incised in line with the incision. Deep retractors were placed. The long of the biceps tendon was identified and there was significant tenosynovitis present.  Tenodesis was performed to the pectoralis tendon with #2 Ethibond. The remaining biceps was followed up into the rotator interval where it was released.   The subscapularis was taken down in a full thickness layer with capsule along the humeral neck extending inferiorly around the humeral head. We continued releasing the capsule directly off of  the osteophytes inferiorly all the way around the corner. This allowed [place] to dislocate the humeral head.   The humeral head had early osteoarthritic wear with some significant cartilage dysfunction .   The rotator cuff  was carefully examined and noted to be irreperably torn.  The decision was confirmed that a reverse total shoulder was indicated for this patient.  There were osteophytes along the inferior humeral neck. The osteophytes were removed with an osteotome and a rongeur.  Osteophytes were removed with a rongeur and an osteotome and the anatomic neck was well visualized.     A humeral cutting guide was inserted down the intramedullary canal. The version was set at 20 of retroversion. Humeral osteotomy was performed with an oscillating saw. The head fragment was passed off the back table. A starter awl was used to open the humeral canal. We next used T-handle straight sound reamers to ream up to an appropriate fit. A chisel was used to remove proximal humeral bone. We then broached starting with a size one broach and broaching up to 2 which obtained an appropriate fit. The broach handle was removed. A cut protector was placed. The broach handle was removed and a cut protector was placed. The humerus was retracted posteriorly and we turned our attention to glenoid exposure.  The subscapularis was again identified and immediately we took care to palpate the axillary nerve anteriorly and verify its position with gentle palpation as well as the tug test.  We then released the SGHL with bovie cautery prior to placing a curved mayo at the junction of the anterior glenoid well above the axillary nerve and bluntly dissecting the subscapularis from the capsule.  We then carefully protected the axillary nerve as we gently released the inferior capsule to fully mobilize the subscapularis.  An anterior deltoid retractor was then placed as well as a small Hohmann retractor superiorly.   The glenoid was inspected and had evidence of severe osteoarthritic wear with minimal cartilage loss and in setting of early cuff arthropathy. The remaining labrum was removed circumferentially taking great care not to disrupt the posterior  capsule.   The glenoid drill guide was placed and used to drill a guide pin in the center, inferior position. The glenoid face was then reamed concentrically over the guide wire. The center hole was drilled over the guidepin in a near anatomic angle of version. Next the glenoid vault was drilled back to a depth of 40 mm.  We tapped and then placed a 66m size baseplate with 0107mlateralization was selected with a 40 mm x 6.5 mm length central screw.  The base plate was screwed into the glenoid vault obtaining secure fixation. We next placed superior and inferior locking screws for additional fixation.  Next a 36 mm glenosphere was selected and impacted onto the baseplate. The center screw was tightened.  We turned attention back to the humeral side. The cut protector was removed. We trialed with multiple size tray and polyethylene options and selected a 6 which provided good stability and range of motion without excess soft tissue tension. The offset was dialed in to match the normal anatomy. The shoulder was trialed.  There was good ROM in all planes and the shoulder was stable with no inferior translation.  The real humeral implants were opened after again confirming sizes.  The trial was removed. #5 Fiberwire sutures passed through the humeral neck for subscap repair. The humeral component was press-fit obtaining a secure fit. A +0 high  offset tray was selected and impacted onto the stem.  A 36+6 polyethylene liner was impacted onto the stem.  The joint was reduced and thoroughly irrigated with pulsatile lavage. Subscap was repaired back with #5 Fiberwire sutures through bone tunnels. Hemostasis was obtained. The deltopectoral interval was reapproximated with #1 Ethibond. The subcutaneous tissues were closed with 2-0 Vicryl and the skin was closed with running monocryl.    The wounds were cleaned and dried and an Aquacel dressing was placed. The drapes taken down. The arm was placed into sling with  abduction pillow. Patient was awakened, extubated, and transferred to the recovery room in stable condition. There were no intraoperative complications. The sponge, needle, and attention counts were correct at the end of the case.   Joya Gaskins, OPA-C, present and scrubbed throughout the case, critical for completion in a timely fashion, and for retraction, instrumentation, closure.

## 2017-11-10 NOTE — Anesthesia Preprocedure Evaluation (Signed)
Anesthesia Evaluation  Patient identified by MRN, date of birth, ID band Patient awake    Reviewed: Allergy & Precautions, NPO status , Patient's Chart, lab work & pertinent test results  Airway Mallampati: II  TM Distance: >3 FB Neck ROM: Full    Dental no notable dental hx.    Pulmonary neg pulmonary ROS, former smoker,    Pulmonary exam normal breath sounds clear to auscultation       Cardiovascular hypertension, negative cardio ROS Normal cardiovascular exam Rhythm:Regular Rate:Normal     Neuro/Psych negative neurological ROS  negative psych ROS   GI/Hepatic negative GI ROS, Neg liver ROS,   Endo/Other  negative endocrine ROS  Renal/GU Renal InsufficiencyRenal diseasenegative Renal ROS  negative genitourinary   Musculoskeletal negative musculoskeletal ROS (+) Arthritis , Osteoarthritis,    Abdominal   Peds negative pediatric ROS (+)  Hematology negative hematology ROS (+)   Anesthesia Other Findings   Reproductive/Obstetrics negative OB ROS                             Anesthesia Physical Anesthesia Plan  ASA: II  Anesthesia Plan: General and Regional   Post-op Pain Management:  Regional for Post-op pain   Induction: Intravenous  PONV Risk Score and Plan: 2 and Ondansetron and Midazolam  Airway Management Planned: Oral ETT  Additional Equipment:   Intra-op Plan:   Post-operative Plan: Extubation in OR  Informed Consent: I have reviewed the patients History and Physical, chart, labs and discussed the procedure including the risks, benefits and alternatives for the proposed anesthesia with the patient or authorized representative who has indicated his/her understanding and acceptance.   Dental advisory given  Plan Discussed with: CRNA  Anesthesia Plan Comments:         Anesthesia Quick Evaluation

## 2017-11-10 NOTE — Transfer of Care (Signed)
Immediate Anesthesia Transfer of Care Note  Patient: Austin Richards  Procedure(s) Performed: TOTAL SHOULDER ARTHROPLASTY (Right )  Patient Location: PACU  Anesthesia Type:General and Regional  Level of Consciousness: awake, alert  and oriented  Airway & Oxygen Therapy: Patient Spontanous Breathing and Patient connected to nasal cannula oxygen  Post-op Assessment: Report given to RN and Post -op Vital signs reviewed and stable  Post vital signs: Reviewed and stable  Last Vitals:  Vitals:   11/10/17 0632 11/10/17 1110  BP: (!) 170/84   Pulse: 84   Resp: 20   Temp: 36.6 C (!) (P) 36.1 C  SpO2: 100%     Last Pain:  Vitals:   11/10/17 1110  TempSrc:   PainSc: (P) 0-No pain         Complications: No apparent anesthesia complications

## 2017-11-10 NOTE — Anesthesia Postprocedure Evaluation (Signed)
Anesthesia Post Note  Patient: Austin Richards  Procedure(s) Performed: TOTAL SHOULDER ARTHROPLASTY (Right )     Patient location during evaluation: PACU Anesthesia Type: Regional and General Level of consciousness: awake and alert Pain management: pain level controlled Vital Signs Assessment: post-procedure vital signs reviewed and stable Respiratory status: spontaneous breathing, nonlabored ventilation and respiratory function stable Cardiovascular status: blood pressure returned to baseline and stable Postop Assessment: no apparent nausea or vomiting Anesthetic complications: no    Last Vitals:  Vitals:   11/10/17 1153 11/10/17 1218  BP:  138/75  Pulse:  80  Resp:  15  Temp: (!) 36.1 C (!) 36.1 C  SpO2:  95%    Last Pain:  Vitals:   11/10/17 1218  TempSrc: Oral  PainSc:                  Lynda Rainwater

## 2017-11-10 NOTE — Anesthesia Procedure Notes (Signed)
Anesthesia Regional Block: Interscalene brachial plexus block   Pre-Anesthetic Checklist: ,, timeout performed, Correct Patient, Correct Site, Correct Laterality, Correct Procedure, Correct Position, site marked, Risks and benefits discussed,  Surgical consent,  Pre-op evaluation,  At surgeon's request and post-op pain management  Laterality: Right  Prep: chloraprep       Needles:  Injection technique: Single-shot  Needle Type: Stimiplex     Needle Length: 9cm  Needle Gauge: 21     Additional Needles:   Procedures:,,,, ultrasound used (permanent image in chart),,,,  Narrative:  Start time: 11/10/2017 8:05 AM End time: 11/10/2017 8:10 AM Injection made incrementally with aspirations every 5 mL.  Performed by: Personally  Anesthesiologist: Lynda Rainwater, MD

## 2017-11-11 ENCOUNTER — Encounter (HOSPITAL_COMMUNITY): Payer: Self-pay | Admitting: Orthopaedic Surgery

## 2017-11-11 MED ORDER — OXYCODONE HCL 5 MG PO TABS: ORAL_TABLET | ORAL | 0 refills | Status: AC

## 2017-11-11 MED ORDER — OMEPRAZOLE 20 MG PO CPDR: 20.0000 mg | DELAYED_RELEASE_CAPSULE | Freq: Every day | ORAL | 0 refills | Status: AC

## 2017-11-11 MED ORDER — ACETAMINOPHEN 500 MG PO TABS: 1000.0000 mg | ORAL_TABLET | Freq: Three times a day (TID) | ORAL | 0 refills | Status: AC

## 2017-11-11 MED ORDER — ONDANSETRON HCL 4 MG PO TABS: 4.0000 mg | ORAL_TABLET | Freq: Three times a day (TID) | ORAL | 1 refills | Status: AC | PRN

## 2017-11-11 MED ORDER — MELOXICAM 7.5 MG PO TABS: 7.5000 mg | ORAL_TABLET | Freq: Every day | ORAL | 2 refills | Status: AC

## 2017-11-11 NOTE — Discharge Summary (Signed)
Patient ID: Austin Richards MRN: [id number] DOB/AGE: 66-Feb-1952 66 y.o.  Admit date: 11/10/2017 Discharge date: 11/11/2017  Admission Diagnoses:R rotator cuff arthropathy  Discharge Diagnoses:  Active Problems:   Rotator cuff tear arthropathy, right   Past Medical History:  Diagnosis Date  . Arthritis   . Ataxia   . 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 Hx of colonic polyp   . Hyperlipidemia   . Migraine   . Pre-diabetes      Procedures Performed: R reverse total shoulder  Discharged Condition: good  Hospital Course: Patient brought in as an outpatient for surgery.  Tolerated procedure well.  Was kept for monitoring overnight for pain control and medical monitoring postop and was found to be stable for DC home the morning after surgery.  Patient was instructed on specific activity restrictions and all questions were answered.   Consults: None  Significant Diagnostic Studies: No additional pertinent studies  Treatments: Surgery  Discharge Exam:  Dressing CDI and sling well fitting,  full and painless ROM throughout hand with DPC of 0. + Motor in  AIN, PIN, Ulnar distributions. Axillary nerve sensation preserved and symmetric.  Sensation intact in medial, radial, and ulnar distributions. Well perfused digits.     Disposition: 01-Home or Self Care  Discharge Instructions    Call MD for:  persistant nausea and vomiting   Complete by:  As directed    Call MD for:  redness, tenderness, or signs of infection (pain, swelling, redness, odor or green/yellow discharge around incision site)   Complete by:  As directed    Call MD for:  severe uncontrolled pain   Complete by:  As directed    Diet - low sodium heart healthy   Complete by:  As directed    Discharge instructions   Complete by:  As directed    Austin Charter MD, MPH [facility]. [address] [phone] (tel)     [phone] (fax)   [name] may leave the operative dressing in place until your follow-up appointment. KEEP THE INCISIONS CLEAN AND DRY. Use the Cryocuff, GameReady or Ice as often as possible for the first 3-4 days, then as needed for pain relief.  You may shower on Post-Op Day #2. The dressing is water resistant but do not scrub it as it may start to peel up.  You may remove the sling for showering, but keep a water resistant pillow under the arm to keep both the elbow and shoulder away from the body (mimicking the abduction sling). Gently pat the area dry. Do not soak the shoulder in water. Do not go swimming in the pool or ocean until your sutures are removed.  EXERCISES Wear the sling at all times except when doing your exercises. You may remove the sling for showering, but keep the arm across the chest or in a secondary sling.   Accidental/Purposeful External Rotation and shoulder flexion (reaching behind you) is to be avoided at all costs for the first month. Please perform the exercises:   Elbow / Hand / Wrist  Range of Motion Exercises POST-OP A multi-modal approach will be used to treat your pain. Oxycodone - This is a strong narcotic, to be used only on an "as needed" basis for pain. Meloxicam- An anti-inflammatory medication Acetaminophen - A non-narcotic pain medicine.  Use 1000mg  three  times a day for the first 14 days after surgery If you have any adverse effects with the medications, please call our office.  FOLLOW-UP If you develop a Fever (<101.5), Redness or Drainage from the surgical incision site, please call our office to arrange for an evaluation. Please call the office to schedule a follow-up appointment for a wound check, 7-10 days post-operatively.    IF YOU HAVE ANY QUESTIONS, PLEASE FEEL FREE TO CALL OUR OFFICE.   HELPFUL INFORMATION  Your arm will be in a sling following surgery. You  will be in this sling for the next 3-4 weeks.  I will let you know the exact duration at your follow-up visit.  You may be more comfortable sleeping in a semi-seated position the first few nights following surgery.  Keep a pillow propped under the elbow and forearm for comfort.  If you have a recliner type of chair it might be beneficial.  If not that is fine too, but it would be helpful to sleep propped up with pillows behind your operated shoulder as well under your elbow and forearm.  This will reduce pulling on the suture lines.  We suggest you use the pain medication the first night prior to going to bed, in order to ease any pain when the anesthesia wears off. You should avoid taking pain medications on an empty stomach as it will make you nauseous.  Do not drink alcoholic beverages or take illicit drugs when taking pain medications.  In most states it is against the law to drive while your arm is in a sling. And certainly against the law to drive while taking narcotics.  You may return to work/school in the next couple of days when you feel up to it. Desk work and typing in the sling is fine.  When dressing, put your operative arm in the sleeve first.  When getting undressed, take your operative arm out last.  Loose fitting, button-down shirts are recommended.  Pain medication may make you constipated.  Below are a few solutions to try in this order: Decrease the amount of pain medication if you aren't having pain. Drink lots of decaffeinated fluids. Drink prune juice and/or each dried prunes  If the first 3 don't work start with additional solutions Take Colace - an over-the-counter stool softener Take Senokot - an over-the-counter laxative Take Miralax - a stronger over-the-counter laxative   Increase activity slowly   Complete by:  As directed      Allergies as of 11/11/2017      Reactions   Codeine Nausea And Vomiting   Penicillins Hives, Nausea And Vomiting, Other (See  Comments)   Has patient had a PCN reaction causing immediate rash, facial/tongue/throat swelling, SOB or lightheadedness with hypotension: Yes Has patient had a PCN reaction causing severe rash involving mucus membranes or skin necrosis: No Has patient had a PCN reaction that required hospitalization: Yes Has patient had a PCN reaction occurring within the last 10 years: No If all of the above answers are "NO", then may proceed with Cephalosporin use.   Latex Itching   itching      Medication List    TAKE these medications   acetaminophen 500 MG tablet Commonly known as:  TYLENOL Take 2 tablets (1,000 mg total) by mouth every 8 (eight) hours for 14 days. What changed:    how much to take  when to take this  reasons to take this   amLODipine 5 MG tablet Commonly known as:  NORVASC Take 1 tablet (5 mg total) by mouth daily.   fenofibrate micronized 130 MG capsule Commonly known as:  ANTARA Take 1 capsule (130 mg total) by mouth daily before breakfast. What changed:  how much to take   meloxicam 7.5 MG tablet Commonly known as:  MOBIC Take 1 tablet (7.5 mg total) by mouth daily. What changed:    when to take this  reasons to take this   MULTIVITAMIN PO Take 1 tablet by mouth daily.   omega-3 acid ethyl esters 1 g capsule Commonly known as:  LOVAZA Take 2 capsules (2 g total) by mouth 2 (two) times daily.   omeprazole 20 MG capsule Commonly known as:  PRILOSEC Take 1 capsule (20 mg total) by mouth daily for 14 days.   ondansetron 4 MG tablet Commonly known as:  ZOFRAN Take 1 tablet (4 mg total) by mouth every 8 (eight) hours as needed for up to 7 days for nausea or vomiting.   oxyCODONE 5 MG immediate release tablet Commonly known as:  Oxy IR/ROXICODONE Take 1-2 pills every 6 hrs as needed for pain

## 2017-11-11 NOTE — Evaluation (Signed)
Occupational Therapy Evaluation Patient Details Name: Austin Richards MRN: [id number] DOB: 1951/04/14 Today's Date: 11/11/2017    History of Present Illness Pt is a 67 y.o. male s/p R reverse TSA. PMHx: Arthritis, CKD, DM, Hyperlipidemia.   Clinical Impression   Pt reports he was independent with ADL and mobility PTA. Currently pt overall mod assist for ADL. Pt required close min guard assist for functional mobility due to balance and gait deficits; pt with LOB x1 during mobility requiring mod assist to correct. All shoulder education completed with pt and sister. Pt planning to d/c home with 24/7 supervision from family. Pt would benefit from continued skilled OT to address established goals.    Follow Up Recommendations  Follow surgeon's recommendation for DC plan and follow-up therapies;Supervision/Assistance - 24 hour    Equipment Recommendations  None recommended by OT    Recommendations for Other Services       Precautions / Restrictions Precautions Precautions: Fall;Shoulder Type of Shoulder Precautions: No orders; maintained NO A/PROM at shoulder. Shoulder Interventions: Shoulder sling/immobilizer Precaution Booklet Issued: Yes (comment) Precaution Comments: Unsteady gait at baseline, no falls recently Required Braces or Orthoses: Sling Restrictions Weight Bearing Restrictions: Yes RUE Weight Bearing: Non weight bearing      Mobility Bed Mobility Overal bed mobility: Needs Assistance Bed Mobility: Supine to Sit     Supine to sit: Supervision;HOB elevated     General bed mobility comments: Pt planning to sleep in recliner chair initially. Supervision for safety, HOB elevated with use of bed rail.  Transfers Overall transfer level: Needs assistance Equipment used: None Transfers: Sit to/from Stand Sit to Stand: Min guard         General transfer comment: Close min guard for safety and balance    Balance Overall balance assessment: Needs  assistance Sitting-balance support: Feet supported;No upper extremity supported Sitting balance-Leahy Scale: Good     Standing balance support: No upper extremity supported;During functional activity Standing balance-Leahy Scale: Poor Standing balance comment: LOB x1 during mobility requiring mod assist to correct                           ADL either performed or assessed with clinical judgement   ADL Overall ADL's : Needs assistance/impaired Eating/Feeding: Minimal assistance;Sitting   Grooming: Minimal assistance;Sitting   Upper Body Bathing: Moderate assistance;Sitting   Lower Body Bathing: Moderate assistance;Sit to/from stand   Upper Body Dressing : Moderate assistance;Sitting   Lower Body Dressing: Moderate assistance;Sit to/from stand   Toilet Transfer: Min guard;Ambulation Toilet Transfer Details (indicate cue type and reason): Pt with unsteady gait; sister reports present PTA. One LOB in hallway requiring mod assist to correct       Tub/Shower Transfer Details (indicate cue type and reason): Plan to sponge bathe initially Functional mobility during ADLs: Min guard General ADL Comments: Educated pt an sister on sling management and wear, proper positioning in bed/chair, UB bathing/dressing technique, fall prevention strategies, RUE NWB status.     Vision Baseline Vision/History: Wears glasses Wears Glasses: At all times       Perception     Praxis      Pertinent Vitals/Pain Pain Assessment: Faces Faces Pain Scale: Hurts even more Pain Location: R shoulder Pain Descriptors / Indicators: Grimacing;Guarding Pain Intervention(s): Monitored during session;Repositioned;Ice applied     Hand Dominance Right   Extremity/Trunk Assessment Upper Extremity Assessment Upper Extremity Assessment: RUE deficits/detail RUE Deficits / Details: Full AROM at digits and wrist RUE:  Unable to fully assess due to immobilization   Lower Extremity Assessment Lower  Extremity Assessment: Generalized weakness   Cervical / Trunk Assessment Cervical / Trunk Assessment: Normal   Communication Communication Communication: Expressive difficulties;HOH(wears hearing aids)   Cognition Arousal/Alertness: Awake/alert Behavior During Therapy: WFL for tasks assessed/performed Overall Cognitive Status: History of cognitive impairments - at baseline                                     General Comments       Exercises     Shoulder Instructions      Home Living Family/patient expects to be discharged to:: Private residence Living Arrangements: Alone Available Help at Discharge: Family;Available 24 hours/day Type of Home: House Home Access: Stairs to enter     Home Layout: One level     Bathroom Shower/Tub: Occupational psychologist: Handicapped height     Home Equipment: Shower seat - built in   Additional Comments: Planning to stay at sisters house initially      Prior Functioning/Environment Level of Independence: Independent                 OT Problem List: Impaired balance (sitting and/or standing);Decreased cognition;Decreased knowledge of use of DME or AE;Decreased knowledge of precautions;Decreased safety awareness;Pain;Impaired UE functional use;Increased edema      OT Treatment/Interventions: Self-care/ADL training;Therapeutic exercise;DME and/or AE instruction;Energy conservation;Therapeutic activities;Patient/family education;Balance training    OT Goals(Current goals can be found in the care plan section) Acute Rehab OT Goals Patient Stated Goal: return home OT Goal Formulation: With patient/family Time For Goal Achievement: 11/25/17 Potential to Achieve Goals: Good ADL Goals Pt Will Perform Upper Body Bathing: with min assist;sitting Pt Will Perform Upper Body Dressing: with min assist;sitting Pt Will Transfer to Toilet: with supervision;ambulating;regular height toilet Additional ADL Goal #1:  Pt/caregiver will independently don/doff sling.  OT Frequency: Min 3X/week   Barriers to D/C:            Co-evaluation              AM-PAC PT "6 Clicks" Daily Activity     Outcome Measure Help from another person eating meals?: A Little Help from another person taking care of personal grooming?: A Little Help from another person toileting, which includes using toliet, bedpan, or urinal?: A Little Help from another person bathing (including washing, rinsing, drying)?: A Lot Help from another person to put on and taking off regular upper body clothing?: A Lot Help from another person to put on and taking off regular lower body clothing?: A Lot 6 Click Score: 15   End of Session Equipment Utilized During Treatment: Other (comment)(sling) Nurse Communication: Mobility status;Other (comment)(sister would like to speak with RN)  Activity Tolerance: Patient tolerated treatment well Patient left: in chair;with call bell/phone within reach;with family/visitor present  OT Visit Diagnosis: Unsteadiness on feet (R26.81);Other abnormalities of gait and mobility (R26.89);Pain Pain - Right/Left: Right Pain - part of body: Shoulder                Time: 4098-1191 OT Time Calculation (min): 32 min Charges:  OT General Charges $OT Visit: 1 Visit OT Evaluation $OT Eval Moderate Complexity: 1 Mod OT Treatments $Self Care/Home Management : 8-22 mins G-Codes:     Shadow Stiggers A. Ulice Brilliant, M.S., OTR/L Pager: [place] 11/11/2017, 9:09 AM

## 2017-11-23 DIAGNOSIS — M19011 Primary osteoarthritis, right shoulder: Secondary | ICD-10-CM | POA: Diagnosis not present

## 2017-11-29 DIAGNOSIS — M25611 Stiffness of right shoulder, not elsewhere classified: Secondary | ICD-10-CM | POA: Diagnosis not present

## 2017-11-29 DIAGNOSIS — M6281 Muscle weakness (generalized): Secondary | ICD-10-CM | POA: Diagnosis not present

## 2017-12-02 DIAGNOSIS — M25611 Stiffness of right shoulder, not elsewhere classified: Secondary | ICD-10-CM | POA: Diagnosis not present

## 2017-12-02 DIAGNOSIS — M6281 Muscle weakness (generalized): Secondary | ICD-10-CM | POA: Diagnosis not present

## 2017-12-06 DIAGNOSIS — M25611 Stiffness of right shoulder, not elsewhere classified: Secondary | ICD-10-CM | POA: Diagnosis not present

## 2017-12-06 DIAGNOSIS — M6281 Muscle weakness (generalized): Secondary | ICD-10-CM | POA: Diagnosis not present

## 2017-12-09 DIAGNOSIS — M6281 Muscle weakness (generalized): Secondary | ICD-10-CM | POA: Diagnosis not present

## 2017-12-09 DIAGNOSIS — M25611 Stiffness of right shoulder, not elsewhere classified: Secondary | ICD-10-CM | POA: Diagnosis not present

## 2017-12-13 DIAGNOSIS — M25611 Stiffness of right shoulder, not elsewhere classified: Secondary | ICD-10-CM | POA: Diagnosis not present

## 2017-12-13 DIAGNOSIS — M6281 Muscle weakness (generalized): Secondary | ICD-10-CM | POA: Diagnosis not present

## 2017-12-14 DIAGNOSIS — M25611 Stiffness of right shoulder, not elsewhere classified: Secondary | ICD-10-CM | POA: Diagnosis not present

## 2017-12-16 DIAGNOSIS — M6281 Muscle weakness (generalized): Secondary | ICD-10-CM | POA: Diagnosis not present

## 2017-12-16 DIAGNOSIS — M25611 Stiffness of right shoulder, not elsewhere classified: Secondary | ICD-10-CM | POA: Diagnosis not present

## 2017-12-20 DIAGNOSIS — M6281 Muscle weakness (generalized): Secondary | ICD-10-CM | POA: Diagnosis not present

## 2017-12-20 DIAGNOSIS — M25611 Stiffness of right shoulder, not elsewhere classified: Secondary | ICD-10-CM | POA: Diagnosis not present

## 2017-12-23 DIAGNOSIS — M25611 Stiffness of right shoulder, not elsewhere classified: Secondary | ICD-10-CM | POA: Diagnosis not present

## 2017-12-23 DIAGNOSIS — M6281 Muscle weakness (generalized): Secondary | ICD-10-CM | POA: Diagnosis not present

## 2017-12-27 DIAGNOSIS — M25611 Stiffness of right shoulder, not elsewhere classified: Secondary | ICD-10-CM | POA: Diagnosis not present

## 2017-12-27 DIAGNOSIS — M6281 Muscle weakness (generalized): Secondary | ICD-10-CM | POA: Diagnosis not present

## 2017-12-30 DIAGNOSIS — M6281 Muscle weakness (generalized): Secondary | ICD-10-CM | POA: Diagnosis not present

## 2017-12-30 DIAGNOSIS — M25611 Stiffness of right shoulder, not elsewhere classified: Secondary | ICD-10-CM | POA: Diagnosis not present

## 2018-01-03 DIAGNOSIS — M6281 Muscle weakness (generalized): Secondary | ICD-10-CM | POA: Diagnosis not present

## 2018-01-03 DIAGNOSIS — M25611 Stiffness of right shoulder, not elsewhere classified: Secondary | ICD-10-CM | POA: Diagnosis not present

## 2018-01-04 ENCOUNTER — Ambulatory Visit (INDEPENDENT_AMBULATORY_CARE_PROVIDER_SITE_OTHER): Payer: Medicare Other | Admitting: Neurology

## 2018-01-04 ENCOUNTER — Telehealth: Payer: Self-pay | Admitting: Neurology

## 2018-01-04 ENCOUNTER — Encounter: Payer: Self-pay | Admitting: Neurology

## 2018-01-04 VITALS — BP 147/73 | HR 95 | Ht 67.0 in | Wt 176.5 lb

## 2018-01-04 DIAGNOSIS — R269 Unspecified abnormalities of gait and mobility: Secondary | ICD-10-CM | POA: Diagnosis not present

## 2018-01-04 DIAGNOSIS — G319 Degenerative disease of nervous system, unspecified: Secondary | ICD-10-CM | POA: Diagnosis not present
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
## 2018-01-04 NOTE — Telephone Encounter (Signed)
Need to talk with his sister update information.

## 2018-01-06 DIAGNOSIS — M25611 Stiffness of right shoulder, not elsewhere classified: Secondary | ICD-10-CM | POA: Diagnosis not present

## 2018-01-06 DIAGNOSIS — M6281 Muscle weakness (generalized): Secondary | ICD-10-CM | POA: Diagnosis not present

## 2018-01-10 DIAGNOSIS — M25611 Stiffness of right shoulder, not elsewhere classified: Secondary | ICD-10-CM | POA: Diagnosis not present

## 2018-01-10 DIAGNOSIS — M6281 Muscle weakness (generalized): Secondary | ICD-10-CM | POA: Diagnosis not present

## 2018-01-13 DIAGNOSIS — M6281 Muscle weakness (generalized): Secondary | ICD-10-CM | POA: Diagnosis not present

## 2018-01-13 DIAGNOSIS — M25611 Stiffness of right shoulder, not elsewhere classified: Secondary | ICD-10-CM | POA: Diagnosis not present

## 2018-01-17 DIAGNOSIS — M25611 Stiffness of right shoulder, not elsewhere classified: Secondary | ICD-10-CM | POA: Diagnosis not present

## 2018-01-17 DIAGNOSIS — M6281 Muscle weakness (generalized): Secondary | ICD-10-CM | POA: Diagnosis not present

## 2018-01-20 DIAGNOSIS — M6281 Muscle weakness (generalized): Secondary | ICD-10-CM | POA: Diagnosis not present

## 2018-01-20 DIAGNOSIS — M25611 Stiffness of right shoulder, not elsewhere classified: Secondary | ICD-10-CM | POA: Diagnosis not present

## 2018-02-07 DIAGNOSIS — M6281 Muscle weakness (generalized): Secondary | ICD-10-CM | POA: Diagnosis not present

## 2018-02-07 DIAGNOSIS — M25611 Stiffness of right shoulder, not elsewhere classified: Secondary | ICD-10-CM | POA: Diagnosis not present

## 2018-02-08 DIAGNOSIS — M25611 Stiffness of right shoulder, not elsewhere classified: Secondary | ICD-10-CM | POA: Diagnosis not present

## 2018-02-09 ENCOUNTER — Ambulatory Visit (INDEPENDENT_AMBULATORY_CARE_PROVIDER_SITE_OTHER): Payer: Medicare Other | Admitting: Family Medicine

## 2018-02-09 ENCOUNTER — Other Ambulatory Visit: Payer: Self-pay

## 2018-02-09 ENCOUNTER — Encounter: Payer: Self-pay | Admitting: Family Medicine

## 2018-02-09 DIAGNOSIS — R270 Ataxia, unspecified: Secondary | ICD-10-CM

## 2018-02-09 DIAGNOSIS — R0989 Other specified symptoms and signs involving the circulatory and respiratory systems: Secondary | ICD-10-CM | POA: Diagnosis not present

## 2018-02-09 DIAGNOSIS — I10 Essential (primary) hypertension: Secondary | ICD-10-CM

## 2018-02-09 DIAGNOSIS — E781 Pure hyperglyceridemia: Secondary | ICD-10-CM

## 2018-02-09 DIAGNOSIS — R7303 Prediabetes: Secondary | ICD-10-CM | POA: Diagnosis not present

## 2018-02-09 DIAGNOSIS — M25511 Pain in right shoulder: Secondary | ICD-10-CM

## 2018-02-09 NOTE — Assessment & Plan Note (Signed)
A1c has been in the prediabetic range.  He will continue to work on diet and exercise.

## 2018-02-09 NOTE — Assessment & Plan Note (Signed)
Well-controlled on recheck.  Continue current regimen. 

## 2018-02-09 NOTE — Assessment & Plan Note (Signed)
Status post shoulder replacement.  He will continue to see orthopedics

## 2018-02-09 NOTE — Assessment & Plan Note (Signed)
ABIs previously ordered though not scheduled.  We will get those set up.

## 2018-02-09 NOTE — Progress Notes (Signed)
  Tommi Rumps, MD Phone: [phone]  Austin Richards is a 67 y.o. male who presents today for f/u.  HYPERTENSION  Disease Monitoring  Home BP Monitoring typically less than 053 systolically, 97Q diastolic Chest pain- no    Dyspnea- no Medications  Compliance-  Taking amlodipine.  Edema- no  HYPERLIPIDEMIA Symptoms Chest pain on exertion:  no   Medications: Compliance- taking fenofibrate Right upper quadrant pain- no  Muscle aches- n  Patient with history of diabetes.  A1c's have been in the prediabetic range.  He is getting back into the gym and has been doing physical therapy.  He healthily and does not eat out much.  No polydipsia or polyuria.  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  Felt to be multifactorial related to deconditioning, right shoulder limitations, and cerebellar atrophy    Social History   Tobacco Use  Smoking Status Former Smoker  Smokeless Tobacco Never Used  Tobacco Comment   "smoked in my 75s for a short period of time"     ROS see history of present illness  Objective  Physical Exam Vitals:   02/09/18 0948 02/09/18 1022  BP: 140/72 130/76  Pulse: 98   Temp: 98.1 F (36.7 C)   SpO2: 98%     BP Readings from Last 3 Encounters:  02/09/18 130/76  01/04/18 (!) 147/73  11/11/17 114/69   Wt Readings from Last 3 Encounters:  02/09/18 176 lb 9.6 oz (80.1 kg)  01/04/18 176 lb 8 oz (80.1 kg)  11/10/17 176 lb (79.8 kg)    Physical Exam  Constitutional: No distress.  Cardiovascular: Normal rate, regular rhythm and normal heart sounds.  Pulmonary/Chest: Effort normal and breath sounds normal.  Musculoskeletal: He exhibits no edema.  Right shoulder nontender with anterior scar, full range of motion actively  Neurological: He is alert.  Skin: Skin is warm and dry. He is not diaphoretic.     Assessment/Plan: Please see individual problem list.  Hypertension Well-controlled on recheck.  Continue  current regimen.  Prediabetes A1c has been in the prediabetic range.  He will continue to work on diet and exercise.  Ataxia He will continue work on balance exercises.  He will continue to see neurology.  Hypertriglyceridemia He will continue fenofibrate.  Plan to recheck at next visit.  Decreased pedal pulses ABIs previously ordered though not scheduled.  We will get those set up.  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  He will continue to see orthopedics   No orders of the defined types were placed in this encounter.   No orders of the defined types were placed in this encounter.    Tommi Rumps, MD [place]

## 2018-02-09 NOTE — Assessment & Plan Note (Signed)
He will continue work on balance exercises.  He will continue to see neurology.

## 2018-02-09 NOTE — Patient Instructions (Signed)
Nice to see you. Please continue to work on diet and exercise. We will see you back in 3 months and plan on checking fasting lab work at that time.

## 2018-02-09 NOTE — Assessment & Plan Note (Signed)
He will continue fenofibrate.  Plan to recheck at next visit.

## 2018-02-10 NOTE — Progress Notes (Signed)
It says that it was attempted to be scheduled but his sister did not want to schedule it since he had surgery. I will send it to be scheduled again.

## 2018-02-15 DIAGNOSIS — M25611 Stiffness of right shoulder, not elsewhere classified: Secondary | ICD-10-CM | POA: Diagnosis not present

## 2018-02-15 DIAGNOSIS — M6281 Muscle weakness (generalized): Secondary | ICD-10-CM | POA: Diagnosis not present

## 2018-04-04 ENCOUNTER — Telehealth: Payer: Self-pay | Admitting: Family Medicine

## 2018-04-04 NOTE — Telephone Encounter (Signed)
Copied from [place] [id number]. Topic: Appointment Scheduling - Scheduling Inquiry for Clinic >> Apr 04, 2018  1:00 PM Synthia Innocent wrote: Reason for CRM: Requesting to be seen this or next week with Dr Caryl Bis for reflux, offered another provider, declined. Able to work in?

## 2018-04-04 NOTE — Telephone Encounter (Signed)
Please advise 

## 2018-04-05 NOTE — Telephone Encounter (Signed)
Patient is feeling better and no longer needs appointment

## 2018-04-05 NOTE — Telephone Encounter (Signed)
He can be put in the same day slot next week.

## 2018-04-05 NOTE — Telephone Encounter (Signed)
Left message to return call, ok for pec to speak to patients sister and schedule patient in a same day slot next week

## 2018-05-13 ENCOUNTER — Encounter: Payer: Self-pay | Admitting: Family Medicine

## 2018-05-13 ENCOUNTER — Ambulatory Visit (INDEPENDENT_AMBULATORY_CARE_PROVIDER_SITE_OTHER): Payer: Medicare Other | Admitting: Family Medicine

## 2018-05-13 VITALS — BP 138/72 | HR 85 | Temp 98.2°F | Ht 67.0 in | Wt 166.2 lb

## 2018-05-13 DIAGNOSIS — K219 Gastro-esophageal reflux disease without esophagitis: Secondary | ICD-10-CM | POA: Diagnosis not present

## 2018-05-13 DIAGNOSIS — E781 Pure hyperglyceridemia: Secondary | ICD-10-CM

## 2018-05-13 DIAGNOSIS — I10 Essential (primary) hypertension: Secondary | ICD-10-CM

## 2018-05-13 DIAGNOSIS — Z23 Encounter for immunization: Secondary | ICD-10-CM | POA: Diagnosis not present

## 2018-05-13 DIAGNOSIS — R7303 Prediabetes: Secondary | ICD-10-CM

## 2018-05-13 DIAGNOSIS — F320 Major depressive disorder, single episode, mild: Secondary | ICD-10-CM | POA: Diagnosis not present

## 2018-05-13 LAB — LIPID PANEL
CHOL/HDL RATIO: 6
Cholesterol: 177 mg/dL (ref 0–200)
HDL: 31.9 mg/dL — AB (ref >39.00)
NONHDL: 145.37
Triglycerides: 253 mg/dL — ABNORMAL HIGH (ref 0.0–149.0)
VLDL: 50.6 mg/dL — ABNORMAL HIGH (ref 0.0–40.0)

## 2018-05-13 LAB — HEMOGLOBIN A1C: Hgb A1c MFr Bld: 5.9 % (ref 4.6–6.5)

## 2018-05-13 LAB — LDL CHOLESTEROL, DIRECT: LDL DIRECT: 109 mg/dL

## 2018-05-13 MED ORDER — ESCITALOPRAM OXALATE 10 MG PO TABS: 10.0000 mg | ORAL_TABLET | Freq: Every day | ORAL | 3 refills | Status: DC

## 2018-05-13 MED ORDER — AMLODIPINE BESYLATE 5 MG PO TABS: 2.5000 mg | ORAL_TABLET | Freq: Every day | ORAL | 3 refills | Status: DC

## 2018-05-13 MED ORDER — PNEUMOCOCCAL 13-VAL CONJ VACC IM SUSP: 0.5000 mL | Freq: Once | INTRAMUSCULAR | 0 refills | Status: AC

## 2018-05-13 NOTE — Progress Notes (Signed)
Austin Rumps, MD Phone: [phone]  Austin Richards is a 67 y.o. male who presents today for f/u.  CC: prediabetes, hypertriglyceridemia, htn, GERD, depression  HYPERTENSION  Disease Monitoring  Home BP Monitoring 120s/70s Chest pain- no    Dyspnea- no Medications  Compliance-  Taking amlodipine. Lightheadedness- yes, right after taking the amlodipine  Edema- no  Prediabetes: No polyuria or polydipsia.  He is going to gym 5 days a week.  Eating healthy meals.  No soda or sweet tea.  He has not seen an ophthalmologist since moving here.  Due for Prevnar.  Hypertriglyceridemia: He is taking his fenofibrate.  Due for recheck of triglycerides.  GERD: Reports over July 4 he had 2 episodes of heartburn when after eating fried food and one after eating pizza.  He had Tums and that resolved it.  It was a burning sensation.  No chest pain or dyspnea.  He has not had any recurrence since then.  No blood in his stool.  Depression: Patient notes he had one weekend around July 21 when he felt depressed.  He felt like he wanted to kill himself at that time.  He had no plan or intent.  No suicide attempt.  He talked to his sister and felt improved.  He has no depression symptoms now though does note decreased interest and pleasure in doing things.  No SI at this time.    Social History   Tobacco Use  Smoking Status Former Smoker  Smokeless Tobacco Never Used  Tobacco Comment   "smoked in my 53s for a short period of time"     ROS see history of present illness  Objective  Physical Exam Vitals:   05/13/18 0928  BP: 138/72  Pulse: 85  Temp: 98.2 F (36.8 C)  SpO2: 98%   Laying blood pressure 132/88 pulse 70 Sitting blood pressure 126/80 pulse 76 Standing blood pressure 132/82 pulse 80  BP Readings from Last 3 Encounters:  05/13/18 138/72  02/09/18 130/76  01/04/18 (!) 147/73   Wt Readings from Last 3 Encounters:  05/13/18 166 lb 3.2 oz (75.4 kg)  02/09/18 176 lb  9.6 oz (80.1 kg)  01/04/18 176 lb 8 oz (80.1 kg)    Physical Exam  Constitutional: No distress.  Cardiovascular: Normal rate, regular rhythm and normal heart sounds.  Pulmonary/Chest: Effort normal and breath sounds normal.  Abdominal: Soft. Bowel sounds are normal. He exhibits no distension. There is no tenderness. There is no rebound and no guarding.  Musculoskeletal: He exhibits no edema.  Neurological: He is alert.  Skin: Skin is warm and dry. He is not diaphoretic.     Assessment/Plan: Please see individual problem list.  Hypertension Well-controlled though has had some lightheadedness.  We will decrease his amlodipine dose and have him follow-up in 2 weeks for BP check with nursing.  Prediabetes Check A1c.  Continue diet and exercise.  Refer to ophthalmology.  Hypertriglyceridemia Due for recheck.  Continue fenofibrate.  GERD (gastroesophageal reflux disease) Symptoms seem consistent with reflux.  They have not recurred.  He will monitor for recurrence.  Depression, major, single episode, mild (HCC) Mild in nature based on PHQ 9.  He did have prior thoughts of suicide though no plan or intent and they have not been recurrent.  Offered referral to psychiatry though he declined.  He is willing to start on medication.  We will start on Lexapro.  Given return precautions.  Patient to get Prevnar at pharmacy.  Orders Placed This Encounter  Procedures  . HgB A1c  . Lipid panel  . Ambulatory referral to Ophthalmology    Referral Priority:   Routine    Referral Type:   Consultation    Referral Reason:   Specialty Services Required    Requested Specialty:   Ophthalmology    Number of Visits Requested:   1    Meds ordered this encounter  Medications  . pneumococcal 13-valent conjugate vaccine (PREVNAR 13) SUSP injection    Sig: Inject 0.5 mLs into the muscle once for 1 dose.    Dispense:  0.5 mL    Refill:  0  . amLODipine (NORVASC) 5 MG tablet    Sig: Take 0.5  tablets (2.5 mg total) by mouth daily.    Dispense:  45 tablet    Refill:  3  . escitalopram (LEXAPRO) 10 MG tablet    Sig: Take 1 tablet (10 mg total) by mouth daily.    Dispense:  30 tablet    Refill:  [place], MD [place]

## 2018-05-13 NOTE — Assessment & Plan Note (Signed)
Mild in nature based on PHQ 9.  He did have prior thoughts of suicide though no plan or intent and they have not been recurrent.  Offered referral to psychiatry though he declined.  He is willing to start on medication.  We will start on Lexapro.  Given return precautions.

## 2018-05-13 NOTE — Assessment & Plan Note (Signed)
Well-controlled though has had some lightheadedness.  We will decrease his amlodipine dose and have him follow-up in 2 weeks for BP check with nursing.

## 2018-05-13 NOTE — Assessment & Plan Note (Signed)
Check A1c.  Continue diet and exercise.  Refer to ophthalmology.

## 2018-05-13 NOTE — Patient Instructions (Addendum)
Nice to see you. We will get lab work today and contact you with the results. Please get the pneumonia vaccine at your pharmacy. Please see the eye doctor when they contact you to get this scheduled. Please decrease your amlodipine to 2.5 mg daily.  This will be half a tablet.  If this does not help with your lightheadedness please let us know. We are going to start you on Lexapro for your depression.  If you develop thoughts of harming yourself please go to the emergency room.

## 2018-05-13 NOTE — Assessment & Plan Note (Signed)
Symptoms seem consistent with reflux.  They have not recurred.  He will monitor for recurrence.

## 2018-05-13 NOTE — Assessment & Plan Note (Signed)
Due for recheck.  Continue fenofibrate.

## 2018-05-20 ENCOUNTER — Other Ambulatory Visit: Payer: Self-pay | Admitting: Family Medicine

## 2018-05-20 DIAGNOSIS — E782 Mixed hyperlipidemia: Secondary | ICD-10-CM

## 2018-05-20 MED ORDER — ROSUVASTATIN CALCIUM 20 MG PO TABS: 20.0000 mg | ORAL_TABLET | Freq: Every day | ORAL | 3 refills | Status: DC

## 2018-05-28 ENCOUNTER — Other Ambulatory Visit: Payer: Self-pay | Admitting: Family Medicine

## 2018-05-28 DIAGNOSIS — E782 Mixed hyperlipidemia: Secondary | ICD-10-CM

## 2018-05-31 NOTE — Telephone Encounter (Signed)
Please contact the pharmacy and see if they can tell us what is covered. Thanks.

## 2018-05-31 NOTE — Telephone Encounter (Signed)
Can we send an alternative? Not covered by insurance.

## 2018-06-01 ENCOUNTER — Ambulatory Visit (INDEPENDENT_AMBULATORY_CARE_PROVIDER_SITE_OTHER): Payer: Medicare Other | Admitting: *Deleted

## 2018-06-01 VITALS — BP 138/80 | HR 95 | Resp 16

## 2018-06-01 DIAGNOSIS — I10 Essential (primary) hypertension: Secondary | ICD-10-CM | POA: Diagnosis not present

## 2018-06-01 NOTE — Progress Notes (Addendum)
Patient here for nurse visit BP check per order from 05/13/18.   Patient reports compliance with prescribed BP medications: yes  Last dose of BP medication: this am  BP Readings from Last 3 Encounters:  05/13/18 138/72  02/09/18 130/76  01/04/18 (!) 147/73   Pulse Readings from Last 3 Encounters:  05/13/18 85  02/09/18 98  01/04/18 95    BP reading from today Left arm 140/80 pulse 94, Right arm BP taken 10 minutes later 138/80 pulse 92.

## 2018-06-02 NOTE — Progress Notes (Signed)
BP is borderline given his history of diabetes.  Please see if he has been checking it at home.  Please see if his lightheadedness has improved with the decreased dose.

## 2018-06-03 NOTE — Progress Notes (Signed)
Patient sister (DPR) said that he been going to the GYM and yes he has been taking BP readings at home. Patient willbring list of readings by office.

## 2018-06-06 NOTE — Telephone Encounter (Signed)
Pt is on Crestor which is no longer covered. Crestor is covered but interacts with the Amlodipine. Please advise

## 2018-06-07 ENCOUNTER — Other Ambulatory Visit: Payer: Self-pay | Admitting: Family Medicine

## 2018-06-07 MED ORDER — ATORVASTATIN CALCIUM 40 MG PO TABS: 40.0000 mg | ORAL_TABLET | Freq: Every day | ORAL | 0 refills | Status: DC

## 2018-06-07 NOTE — Addendum Note (Signed)
Addended by: Nanci Pina on: 06/07/2018 02:59 PM   Modules accepted: Orders

## 2018-06-10 ENCOUNTER — Telehealth: Payer: Self-pay | Admitting: Family Medicine

## 2018-06-10 NOTE — Telephone Encounter (Signed)
Left message to return call to office.

## 2018-06-10 NOTE — Telephone Encounter (Signed)
Patient stated that he understands no w that just because the dosage is lower this medication is just as strong as previously  prescribed cholesterol medication.

## 2018-06-10 NOTE — Telephone Encounter (Signed)
Copied from [place] [id number]. Topic: Quick Communication - See Telephone Encounter >> Jun 10, 2018  1:32 PM Synthia Innocent wrote: CRM for notification. See Telephone encounter for: 06/10/18. Requesting to speak with nurse regarding atorvastatin (LIPITOR) 40 MG tablet, would like to know why meds were lowered. Please advise

## 2018-08-02 DIAGNOSIS — E119 Type 2 diabetes mellitus without complications: Secondary | ICD-10-CM | POA: Diagnosis not present

## 2018-08-02 LAB — HM DIABETES EYE EXAM

## 2018-08-19 ENCOUNTER — Encounter: Payer: Self-pay | Admitting: Family Medicine

## 2018-08-19 ENCOUNTER — Ambulatory Visit (INDEPENDENT_AMBULATORY_CARE_PROVIDER_SITE_OTHER): Payer: Medicare Other | Admitting: Family Medicine

## 2018-08-19 ENCOUNTER — Other Ambulatory Visit: Payer: Self-pay | Admitting: Family Medicine

## 2018-08-19 VITALS — BP 128/86 | HR 86 | Temp 97.9°F | Ht 67.0 in | Wt 165.4 lb

## 2018-08-19 DIAGNOSIS — I10 Essential (primary) hypertension: Secondary | ICD-10-CM | POA: Diagnosis not present

## 2018-08-19 DIAGNOSIS — R7989 Other specified abnormal findings of blood chemistry: Secondary | ICD-10-CM

## 2018-08-19 DIAGNOSIS — E782 Mixed hyperlipidemia: Secondary | ICD-10-CM

## 2018-08-19 DIAGNOSIS — E785 Hyperlipidemia, unspecified: Secondary | ICD-10-CM | POA: Diagnosis not present

## 2018-08-19 DIAGNOSIS — F320 Major depressive disorder, single episode, mild: Secondary | ICD-10-CM

## 2018-08-19 DIAGNOSIS — R945 Abnormal results of liver function studies: Principal | ICD-10-CM

## 2018-08-19 LAB — HEPATIC FUNCTION PANEL
ALBUMIN: 4.8 g/dL (ref 3.5–5.2)
ALK PHOS: 116 U/L (ref 39–117)
ALT: 65 U/L — ABNORMAL HIGH (ref 0–53)
AST: 32 U/L (ref 0–37)
Bilirubin, Direct: 0.2 mg/dL (ref 0.0–0.3)
TOTAL PROTEIN: 7.7 g/dL (ref 6.0–8.3)
Total Bilirubin: 1.4 mg/dL — ABNORMAL HIGH (ref 0.2–1.2)

## 2018-08-19 LAB — LDL CHOLESTEROL, DIRECT: Direct LDL: 54 mg/dL

## 2018-08-19 NOTE — Assessment & Plan Note (Signed)
Check LDL and hepatic function panel.  Continue Lipitor.

## 2018-08-19 NOTE — Assessment & Plan Note (Signed)
Asymptomatic.  We will taper him off of Lexapro as outlined in his AVS.

## 2018-08-19 NOTE — Assessment & Plan Note (Signed)
Well-controlled at home.  Continue current regimen.  Check lab work.

## 2018-08-19 NOTE — Progress Notes (Signed)
  Tommi Rumps, MD Phone: [phone]  Austin Richards is a 67 y.o. male who presents today for f/u.  CC: Hypertension, depression, hyperlipidemia  Hypertension: Taking amlodipine.  Blood pressure running 120s-130/70s.  No chest pain, shortness breath, or edema.  Patient's been exercising 5 days a week with lifting weights and doing the treadmill.  He has also changed his diet.  Hyperlipidemia: He is taking Lipitor.  No right upper quadrant pain or myalgias.  Depression: He is taking Lexapro.  He notes no depression.  No anxiety.  No SI.  He notes since going on this medication he has had issues ejaculating.  He is interested in coming off of the Lexapro.  Social History   Tobacco Use  Smoking Status Former Smoker  Smokeless Tobacco Never Used  Tobacco Comment   "smoked in my 29s for a short period of time"     ROS see history of present illness  Objective  Physical Exam Vitals:   08/19/18 0929  BP: 128/86  Pulse: 86  Temp: 97.9 F (36.6 C)  SpO2: 99%    BP Readings from Last 3 Encounters:  08/19/18 128/86  06/01/18 138/80  05/13/18 138/72   Wt Readings from Last 3 Encounters:  08/19/18 165 lb 6.4 oz (75 kg)  05/13/18 166 lb 3.2 oz (75.4 kg)  02/09/18 176 lb 9.6 oz (80.1 kg)    Physical Exam  Constitutional: No distress.  Cardiovascular: Normal rate, regular rhythm and normal heart sounds.  Pulmonary/Chest: Effort normal and breath sounds normal.  Musculoskeletal: He exhibits no edema.  Neurological: He is alert.  Skin: Skin is warm and dry. He is not diaphoretic.     Assessment/Plan: Please see individual problem list.  Hypertension Well-controlled at home.  Continue current regimen.  Check lab work.  Depression, major, single episode, mild (HCC) Asymptomatic.  We will taper him off of Lexapro as outlined in his AVS.  Hyperlipidemia Check LDL and hepatic function panel.  Continue Lipitor.   Health Maintenance: We will request  pneumonia vaccine and flu vaccine records.  Orders Placed This Encounter  Procedures  . Hepatic function panel    Standing Status:   Future    Number of Occurrences:   1    Standing Expiration Date:   08/20/2019  . LDL cholesterol, direct    Standing Status:   Future    Number of Occurrences:   1    Standing Expiration Date:   08/20/2019    No orders of the defined types were placed in this encounter.    Tommi Rumps, MD [place]

## 2018-08-19 NOTE — Patient Instructions (Signed)
Nice to see you. Please continue with diet and exercise. Please start taking the Lexapro 10 mg every other day for 2 weeks and then discontinue.  If your depression returns please let us know.

## 2018-08-29 ENCOUNTER — Other Ambulatory Visit: Payer: Self-pay | Admitting: Family Medicine

## 2018-08-29 ENCOUNTER — Other Ambulatory Visit: Payer: Medicare Other

## 2018-08-29 DIAGNOSIS — E782 Mixed hyperlipidemia: Secondary | ICD-10-CM

## 2018-09-05 ENCOUNTER — Other Ambulatory Visit (INDEPENDENT_AMBULATORY_CARE_PROVIDER_SITE_OTHER): Payer: Medicare Other

## 2018-09-05 DIAGNOSIS — R7989 Other specified abnormal findings of blood chemistry: Secondary | ICD-10-CM

## 2018-09-05 DIAGNOSIS — R945 Abnormal results of liver function studies: Secondary | ICD-10-CM | POA: Diagnosis not present

## 2018-09-05 LAB — HEPATIC FUNCTION PANEL
ALT: 63 U/L — AB (ref 0–53)
AST: 33 U/L (ref 0–37)
Albumin: 4.5 g/dL (ref 3.5–5.2)
Alkaline Phosphatase: 116 U/L (ref 39–117)
BILIRUBIN TOTAL: 1.4 mg/dL — AB (ref 0.2–1.2)
Bilirubin, Direct: 0.2 mg/dL (ref 0.0–0.3)
Total Protein: 7.4 g/dL (ref 6.0–8.3)

## 2018-11-30 ENCOUNTER — Other Ambulatory Visit: Payer: Self-pay | Admitting: Family Medicine

## 2018-11-30 DIAGNOSIS — E782 Mixed hyperlipidemia: Secondary | ICD-10-CM

## 2019-02-17 ENCOUNTER — Ambulatory Visit: Payer: Medicare Other | Admitting: Family Medicine

## 2019-02-21 ENCOUNTER — Ambulatory Visit (INDEPENDENT_AMBULATORY_CARE_PROVIDER_SITE_OTHER): Payer: Medicare Other | Admitting: Family Medicine

## 2019-02-21 ENCOUNTER — Telehealth: Payer: Self-pay | Admitting: Family Medicine

## 2019-02-21 ENCOUNTER — Other Ambulatory Visit: Payer: Self-pay

## 2019-02-21 ENCOUNTER — Encounter: Payer: Self-pay | Admitting: Family Medicine

## 2019-02-21 DIAGNOSIS — I10 Essential (primary) hypertension: Secondary | ICD-10-CM | POA: Diagnosis not present

## 2019-02-21 DIAGNOSIS — E782 Mixed hyperlipidemia: Secondary | ICD-10-CM | POA: Diagnosis not present

## 2019-02-21 DIAGNOSIS — F320 Major depressive disorder, single episode, mild: Secondary | ICD-10-CM

## 2019-02-21 DIAGNOSIS — R7303 Prediabetes: Secondary | ICD-10-CM

## 2019-02-21 NOTE — Telephone Encounter (Signed)
Please contact the patient and get him scheduled for lab work in 1 month and follow-up with me in 6 months.

## 2019-02-21 NOTE — Assessment & Plan Note (Signed)
Check A1c with lab work. 

## 2019-02-21 NOTE — Assessment & Plan Note (Addendum)
Adequately controlled.  Continue current regimen.  They will check his blood pressure once weekly.

## 2019-02-21 NOTE — Progress Notes (Signed)
Virtual Visit via video Note  This visit type was conducted due to national recommendations for restrictions regarding the COVID-19 pandemic (e.g. social distancing).  This format is felt to be most appropriate for this patient at this time.  All issues noted in this document were discussed and addressed.  No physical exam was performed (except for noted visual exam findings with Video Visits).   I connected with [employer] today at  2:00 PM EDT by a video enabled telemedicine application and verified that I am speaking with the correct person using two identifiers. Location patient: home Location provider: work Persons participating in the virtual visit: patient, provider, [employer] (sister)  I discussed the limitations, risks, security and privacy concerns of performing an evaluation and management service by telephone and the availability of in person appointments. I also discussed with the patient that there may be a patient responsible charge related to this service. The patient expressed understanding and agreed to proceed.   Reason for visit: follow-up  HPI: Hypertension: Typically 130s over upper 57s.  Taking amlodipine.  No chest pain, shortness of breath, or edema.  Hyperlipidemia: Taking Lipitor.  No right upper quadrant pain or myalgias.  Depression: Patient denies depressive symptoms.  No SI.  Taking Lexapro.  Prediabetes: He does not check blood sugars.  No polyuria or polydipsia.   ROS: See pertinent positives and negatives per HPI.  Past Medical History:  Diagnosis Date  . Arthritis   . Ataxia   . 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 Hx of colonic polyp   . Hyperlipidemia   . Migraine   . Pre-diabetes     Past Surgical History:  Procedure Laterality Date  . CHOLECYSTECTOMY    . 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  acetaminophen (TYLENOL) 500 MG tablet, Take 500 mg by mouth as needed., Disp: , Rfl:  .  amLODipine (NORVASC) 5 MG tablet, Take 0.5 tablets (2.5 mg total) by mouth daily., Disp: 45 tablet, Rfl: 3 .  atorvastatin (LIPITOR) 40 MG tablet, TAKE 1 TABLET (40 MG TOTAL) BY MOUTH DAILY AT 6 PM., Disp: 90 tablet, Rfl: 0 .  escitalopram (LEXAPRO) 10 MG tablet, TAKE 1 TABLET BY MOUTH EVERY DAY, Disp: 90 tablet, Rfl: 2 .  Multiple Vitamins-Minerals (MULTIVITAMIN PO), Take 1 tablet by mouth daily., Disp: , Rfl:   EXAM:  VITALS per patient if applicable: None.  GENERAL: alert, oriented, appears well and in no acute distress  HEENT: atraumatic, conjunttiva clear, no obvious abnormalities on inspection of external nose and ears  NECK: normal movements of the head and neck  LUNGS: on inspection no signs of respiratory distress, breathing rate appears normal, no obvious gross SOB, gasping or wheezing  CV: no obvious cyanosis  MS: moves all visible extremities without noticeable abnormality  PSYCH/NEURO: pleasant and cooperative, no obvious depression or anxiety, speech and thought processing grossly intact  ASSESSMENT AND PLAN:  Discussed the following assessment and plan:  Essential hypertension - Plan: Comp Met (CMET)  Depression, major, single episode, mild (HCC)  Prediabetes - Plan: Hemoglobin A1c  Mixed hyperlipidemia - Plan: Lipid panel  Hypertension Adequately controlled.  Continue current regimen.  They will check his blood pressure once weekly.  Depression, major, single episode, mild (HCC) Asymptomatic.  Continue Lexapro.  Hyperlipidemia Continue Lipitor.  We will check a lipid panel with lab work in the next month.  Prediabetes Check A1c with lab work.  CMA will contact the patient to get him scheduled  for lab work and follow-up.  Social distancing precautions and sick precautions discussed regarding COVID-19.   I discussed the assessment and treatment plan with the patient. The patient was provided an opportunity to ask questions and all were answered. The patient agreed with the plan and demonstrated an understanding of the instructions.   The patient was advised to call back or seek an in-person evaluation if the symptoms worsen or if the condition fails to improve as anticipated.    Tommi Rumps, MD

## 2019-02-21 NOTE — Assessment & Plan Note (Signed)
Asymptomatic.  Continue Lexapro. 

## 2019-02-21 NOTE — Assessment & Plan Note (Signed)
Continue Lipitor.  We will check a lipid panel with lab work in the next month.

## 2019-02-26 ENCOUNTER — Other Ambulatory Visit: Payer: Self-pay | Admitting: Family Medicine

## 2019-02-26 DIAGNOSIS — E782 Mixed hyperlipidemia: Secondary | ICD-10-CM

## 2019-03-24 ENCOUNTER — Other Ambulatory Visit (INDEPENDENT_AMBULATORY_CARE_PROVIDER_SITE_OTHER): Payer: Medicare Other

## 2019-03-24 ENCOUNTER — Other Ambulatory Visit: Payer: Self-pay

## 2019-03-24 DIAGNOSIS — E782 Mixed hyperlipidemia: Secondary | ICD-10-CM

## 2019-03-24 DIAGNOSIS — I10 Essential (primary) hypertension: Secondary | ICD-10-CM

## 2019-03-24 DIAGNOSIS — R7303 Prediabetes: Secondary | ICD-10-CM | POA: Diagnosis not present

## 2019-03-24 LAB — LIPID PANEL
Cholesterol: 208 mg/dL — ABNORMAL HIGH (ref 0–200)
HDL: 29.3 mg/dL — ABNORMAL LOW (ref >39.00)
Total CHOL/HDL Ratio: 7
Triglycerides: 776 mg/dL — ABNORMAL HIGH (ref 0.0–149.0)

## 2019-03-24 LAB — COMPREHENSIVE METABOLIC PANEL
ALT: 29 U/L (ref 0–53)
AST: 20 U/L (ref 0–37)
Albumin: 4.4 g/dL (ref 3.5–5.2)
Alkaline Phosphatase: 115 U/L (ref 39–117)
BUN: 27 mg/dL — ABNORMAL HIGH (ref 6–23)
CO2: 27 mEq/L (ref 19–32)
Calcium: 9.5 mg/dL (ref 8.4–10.5)
Chloride: 102 mEq/L (ref 96–112)
Creatinine, Ser: 1.34 mg/dL (ref 0.40–1.50)
GFR: 53.23 mL/min — ABNORMAL LOW (ref >60.00)
Glucose, Bld: 137 mg/dL — ABNORMAL HIGH (ref 70–99)
Potassium: 4.4 mEq/L (ref 3.5–5.1)
Sodium: 138 mEq/L (ref 135–145)
Total Bilirubin: 1.7 mg/dL — ABNORMAL HIGH (ref 0.2–1.2)
Total Protein: 6.9 g/dL (ref 6.0–8.3)

## 2019-03-24 LAB — LDL CHOLESTEROL, DIRECT: Direct LDL: 45 mg/dL

## 2019-03-24 LAB — HEMOGLOBIN A1C: Hgb A1c MFr Bld: 7 % — ABNORMAL HIGH (ref 4.6–6.5)

## 2019-04-16 ENCOUNTER — Other Ambulatory Visit: Payer: Self-pay | Admitting: Family Medicine

## 2019-04-16 DIAGNOSIS — E782 Mixed hyperlipidemia: Secondary | ICD-10-CM

## 2019-04-16 DIAGNOSIS — R17 Unspecified jaundice: Secondary | ICD-10-CM

## 2019-04-16 MED ORDER — ATORVASTATIN CALCIUM 80 MG PO TABS: 80.0000 mg | ORAL_TABLET | Freq: Every day | ORAL | 1 refills | Status: DC

## 2019-04-18 ENCOUNTER — Telehealth: Payer: Self-pay

## 2019-04-18 NOTE — Telephone Encounter (Signed)
Juliann Pulse the patient was calling about the referral that was placed for this patient, is someone else doing referrals or do they need to wait until Lenna Sciara returns.  Tianne Plott,cma

## 2019-04-18 NOTE — Telephone Encounter (Signed)
Copied from [place] [id number]. Topic: Referral - Status >> Apr 18, 2019  2:47 PM Alanda Slim E wrote: Reason for CRM: Pt's sister(Woodie) called to check the status of the referral for a scan of the kidneys or liver/ Please advise

## 2019-04-21 NOTE — Telephone Encounter (Signed)
Pt's sister(Woodie) called to check the status of the referral for a scan of the kidneys or liver.  Nina,cma

## 2019-04-24 NOTE — Telephone Encounter (Signed)
Pt sch 7/28

## 2019-04-25 ENCOUNTER — Other Ambulatory Visit: Payer: Self-pay

## 2019-04-25 ENCOUNTER — Ambulatory Visit
Admission: RE | Admit: 2019-04-25 | Discharge: 2019-04-25 | Disposition: A | Discharge status: Home / Self Care | Payer: Medicare Other | Source: Ambulatory Visit | Attending: Family Medicine | Admitting: Family Medicine

## 2019-04-25 DIAGNOSIS — R17 Unspecified jaundice: Secondary | ICD-10-CM | POA: Diagnosis not present

## 2019-04-26 ENCOUNTER — Telehealth: Payer: Self-pay | Admitting: Family Medicine

## 2019-04-26 DIAGNOSIS — K769 Liver disease, unspecified: Secondary | ICD-10-CM

## 2019-04-26 NOTE — Telephone Encounter (Signed)
Attempted to contact the patient though this went to a Teacher, music. Please call the patient and let him know that his US showed 2 nonspecific spots on his liver. It is difficult to tell what these are and they could represent a number of different things. I would like to obtain an MRI to fully characterize these findings. Once you speak with the patient I can place any order. Please find out if he has a pacemaker or any metal in his body. If they have questions please let me know and I can call them to discuss.

## 2019-04-27 NOTE — Telephone Encounter (Signed)
Called the patient and received no answer, will call back later.  Fin Hupp,cma

## 2019-04-30 NOTE — Telephone Encounter (Signed)
Please follow-up with the patient regarding this. Thanks.

## 2019-05-01 NOTE — Telephone Encounter (Signed)
Sorry I did ask, his sister said he had some shoulder surgery a while back and she is not sure if he has metal in there or not, nut no pacemaker. 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  Nina,cma

## 2019-05-01 NOTE — Telephone Encounter (Signed)
Noted. Order placed. 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 Thanks.

## 2019-05-01 NOTE — Telephone Encounter (Signed)
Thanks for confirming.  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  Thanks.

## 2019-05-01 NOTE — Telephone Encounter (Signed)
LmtcbGae Bon, cma

## 2019-05-01 NOTE — Telephone Encounter (Signed)
Called and spoke to patient's sister and informed her of [place] results, she understood and stating the MRI would be ok to precede with, she states to have the referral dept call her phone number instead of his number to schedule.  Her number is [phone] she does all his scheduling.  Saara Kijowski,cma

## 2019-05-02 ENCOUNTER — Other Ambulatory Visit: Payer: Self-pay | Admitting: Family Medicine

## 2019-05-03 NOTE — Telephone Encounter (Signed)
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  She also stated that Dr. Sabra Heck, the physician [place] had in [place] also found the spots on his liver and he stated they were white spots, but he never did anything past confirming that they were white spots. 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  Kanav Kazmierczak,cma

## 2019-05-04 NOTE — Telephone Encounter (Signed)
That is good to know that these have been seen previously. He should be ok to have the MRI with one kidney.

## 2019-05-11 ENCOUNTER — Other Ambulatory Visit: Payer: Self-pay

## 2019-05-11 ENCOUNTER — Ambulatory Visit
Admission: RE | Admit: 2019-05-11 | Discharge: 2019-05-11 | Disposition: A | Discharge status: Home / Self Care | Payer: Medicare Other | Source: Ambulatory Visit | Attending: Family Medicine | Admitting: Family Medicine

## 2019-05-11 DIAGNOSIS — K769 Liver disease, unspecified: Secondary | ICD-10-CM | POA: Diagnosis not present

## 2019-05-11 DIAGNOSIS — K828 Other specified diseases of gallbladder: Secondary | ICD-10-CM | POA: Diagnosis not present

## 2019-05-11 LAB — POCT I-STAT CREATININE: Creatinine, Ser: 1.2 mg/dL (ref 0.61–1.24)

## 2019-05-11 MED ORDER — GADOBUTROL 1 MMOL/ML IV SOLN
6.0000 mL | Freq: Once | INTRAVENOUS | Status: AC | PRN
  Administered 2019-05-11: 6 mL via INTRAVENOUS

## 2019-05-16 DIAGNOSIS — H903 Sensorineural hearing loss, bilateral: Secondary | ICD-10-CM | POA: Diagnosis not present

## 2019-05-31 ENCOUNTER — Other Ambulatory Visit (INDEPENDENT_AMBULATORY_CARE_PROVIDER_SITE_OTHER): Payer: Medicare Other

## 2019-05-31 ENCOUNTER — Other Ambulatory Visit: Payer: Self-pay

## 2019-05-31 DIAGNOSIS — E782 Mixed hyperlipidemia: Secondary | ICD-10-CM

## 2019-05-31 LAB — LDL CHOLESTEROL, DIRECT: Direct LDL: 32 mg/dL

## 2019-05-31 LAB — HEPATIC FUNCTION PANEL
ALT: 37 U/L (ref 0–53)
AST: 20 U/L (ref 0–37)
Albumin: 4.2 g/dL (ref 3.5–5.2)
Alkaline Phosphatase: 131 U/L — ABNORMAL HIGH (ref 39–117)
Bilirubin, Direct: 0.2 mg/dL (ref 0.0–0.3)
Total Bilirubin: 1.8 mg/dL — ABNORMAL HIGH (ref 0.2–1.2)
Total Protein: 6.8 g/dL (ref 6.0–8.3)

## 2019-05-31 LAB — LIPID PANEL
Cholesterol: 179 mg/dL (ref 0–200)
HDL: 26.5 mg/dL — ABNORMAL LOW (ref >39.00)
Total CHOL/HDL Ratio: 7
Triglycerides: 864 mg/dL — ABNORMAL HIGH (ref 0.0–149.0)

## 2019-06-01 ENCOUNTER — Other Ambulatory Visit: Payer: Self-pay | Admitting: Family Medicine

## 2019-06-07 ENCOUNTER — Other Ambulatory Visit: Payer: Self-pay | Admitting: Family Medicine

## 2019-06-07 DIAGNOSIS — E782 Mixed hyperlipidemia: Secondary | ICD-10-CM

## 2019-06-13 ENCOUNTER — Encounter: Payer: Self-pay | Admitting: *Deleted

## 2019-06-20 ENCOUNTER — Encounter: Payer: Self-pay | Admitting: *Deleted

## 2019-06-26 ENCOUNTER — Telehealth: Payer: Self-pay

## 2019-06-26 NOTE — Telephone Encounter (Signed)
Copied from [place] [id number]. Topic: General - Inquiry >> Jun 26, 2019 11:18 AM Virl Axe D wrote: Reason for CRM: Pt's sister Donell Sievert stated pt is taking new medication without any issues. He is not having any abdominal pain. Please advise.

## 2019-06-28 NOTE — Telephone Encounter (Signed)
Pt's sister Donell Sievert stated pt is taking new medication without any issues. He is not having any abdominal pain.  Kyliee Ortego,cma

## 2019-06-29 NOTE — Telephone Encounter (Signed)
Please confirm what medication she is referring to. He also needs to have lab work to evaluate for a cause of his elevated bilirubin. Please get him scheduled for that. It looks like a mychart message was sent to them regarding this previously. Please try to call to schedule prior to sending a mychart message. Thanks.

## 2019-06-30 NOTE — Telephone Encounter (Signed)
The medication he is speaking of is the new Omega-3 you sent to pharmacy and you asked in the message if he was experiencing any issues while on the medication and they were responding back that he was not having any issues.  I called the patient and left a voicemail for him to call back and schedule a lab appt to recheck his bilirubin.  Alyxander Kollmann,cma

## 2019-07-03 ENCOUNTER — Other Ambulatory Visit: Payer: Self-pay

## 2019-07-03 ENCOUNTER — Other Ambulatory Visit (INDEPENDENT_AMBULATORY_CARE_PROVIDER_SITE_OTHER): Payer: Medicare Other

## 2019-07-03 LAB — CBC
HCT: 46.2 % (ref 39.0–52.0)
Hemoglobin: 16 g/dL (ref 13.0–17.0)
MCHC: 34.7 g/dL (ref 30.0–36.0)
MCV: 86.8 fl (ref 78.0–100.0)
Platelets: 279 10*3/uL (ref 150.0–400.0)
RBC: 5.32 Mil/uL (ref 4.22–5.81)
RDW: 13.9 % (ref 11.5–15.5)
WBC: 7 10*3/uL (ref 4.0–10.5)

## 2019-07-04 LAB — HAPTOGLOBIN: Haptoglobin: 185 mg/dL (ref 43–212)

## 2019-07-04 LAB — RETICULOCYTES
ABS Retic: 118360 cells/uL — ABNORMAL HIGH (ref 25000–9000)
Retic Ct Pct: 2.2 %

## 2019-07-06 ENCOUNTER — Other Ambulatory Visit: Payer: Self-pay | Admitting: Family Medicine

## 2019-07-06 DIAGNOSIS — R701 Abnormal plasma viscosity: Secondary | ICD-10-CM

## 2019-07-06 DIAGNOSIS — R17 Unspecified jaundice: Secondary | ICD-10-CM

## 2019-08-09 DIAGNOSIS — E119 Type 2 diabetes mellitus without complications: Secondary | ICD-10-CM | POA: Diagnosis not present

## 2019-08-09 LAB — HM DIABETES EYE EXAM

## 2019-08-18 ENCOUNTER — Telehealth: Payer: Self-pay

## 2019-08-18 NOTE — Telephone Encounter (Signed)
Copied from [place] [id number]. Topic: Referral - Status >> Aug 18, 2019  3:59 PM Yvette Rack wrote: Reason for CRM: Pt sister Donell Sievert stated they have not heard from anyone regarding the Hematology referral. Pt sister requests call back.

## 2019-08-18 NOTE — Telephone Encounter (Signed)
:   Pt sister Austin Richards stated they have not heard from anyone regarding the Hematology referral. Pt sister requests call back.  Colton Tassin,cma

## 2019-09-01 ENCOUNTER — Ambulatory Visit: Payer: Medicare Other | Admitting: Family Medicine

## 2019-10-04 ENCOUNTER — Other Ambulatory Visit: Payer: Self-pay | Admitting: Family Medicine

## 2019-10-04 DIAGNOSIS — E782 Mixed hyperlipidemia: Secondary | ICD-10-CM

## 2019-10-09 DIAGNOSIS — R17 Unspecified jaundice: Secondary | ICD-10-CM | POA: Diagnosis not present

## 2019-10-09 DIAGNOSIS — R701 Abnormal plasma viscosity: Secondary | ICD-10-CM | POA: Diagnosis not present

## 2019-10-23 ENCOUNTER — Ambulatory Visit: Payer: Medicare Other | Admitting: Family Medicine

## 2019-11-22 ENCOUNTER — Other Ambulatory Visit: Payer: Self-pay

## 2019-11-22 ENCOUNTER — Ambulatory Visit (INDEPENDENT_AMBULATORY_CARE_PROVIDER_SITE_OTHER): Payer: Medicare Other | Admitting: Family Medicine

## 2019-11-22 ENCOUNTER — Encounter: Payer: Self-pay | Admitting: Family Medicine

## 2019-11-22 DIAGNOSIS — R7303 Prediabetes: Secondary | ICD-10-CM

## 2019-11-22 DIAGNOSIS — F320 Major depressive disorder, single episode, mild: Secondary | ICD-10-CM | POA: Diagnosis not present

## 2019-11-22 DIAGNOSIS — I10 Essential (primary) hypertension: Secondary | ICD-10-CM | POA: Diagnosis not present

## 2019-11-22 DIAGNOSIS — E782 Mixed hyperlipidemia: Secondary | ICD-10-CM | POA: Diagnosis not present

## 2019-11-22 NOTE — Assessment & Plan Note (Signed)
Continue lipitor  ?

## 2019-11-22 NOTE — Assessment & Plan Note (Signed)
Adequate control. Continue current medication. BMET ordered.

## 2019-11-22 NOTE — Progress Notes (Signed)
Virtual Visit via video Note  This visit type was conducted due to national recommendations for restrictions regarding the COVID-19 pandemic (e.g. social distancing).  This format is felt to be most appropriate for this patient at this time.  All issues noted in this document were discussed and addressed.  No physical exam was performed (except for noted visual exam findings with Video Visits).   I connected with [employer] today at  2:45 PM EST by a video enabled telemedicine application and verified that I am speaking with the correct person using two identifiers. Location patient: [facility] Location provider: work Persons participating in the virtual visit: patient, provider, [employer] (sister)  I discussed the limitations, risks, security and privacy concerns of performing an evaluation and management service by telephone and the availability of in person appointments. I also discussed with the patient that there may be a patient responsible charge related to this service. The patient expressed understanding and agreed to proceed.  Reason for visit: f/u.  HPI: HYPERTENSION  Disease Monitoring  Home BP Monitoring 135/70 Chest pain- no    Dyspnea- no Medications  Compliance-  Taking amlodipine.   Edema- no  HYPERLIPIDEMIA Symptoms Chest pain on exertion:  no   Medications: Compliance- taking lipitor Right upper quadrant pain- no  Muscle aches- no  Depression: asymptomatic. Continues on lexapro.   Prediabetes: denies polyuria. Has been exercising at home.   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 Given normal haptoglobin and lack of anemia they did not feel that he had any hemolysis.     ROS: See pertinent positives and negatives per HPI.  Past Medical History:  Diagnosis Date  . Arthritis   . Ataxia   . 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 Hx of colonic polyp   . Hyperlipidemia   . Migraine   . Pre-diabetes     Past Surgical History:  Procedure Laterality Date  . CHOLECYSTECTOMY    . 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  acetaminophen (TYLENOL) 500 MG tablet, Take 500 mg by mouth as needed., Disp: , Rfl:  .  amLODipine (NORVASC) 5 MG tablet, TAKE 1/2 TABLET BY MOUTH DAILY, Disp: 45 tablet, Rfl: 3 .  atorvastatin (LIPITOR) 80 MG tablet, TAKE 1 TABLET (80 MG TOTAL) BY MOUTH DAILY AT 6 PM., Disp: 90 tablet, Rfl: 1 .  escitalopram (LEXAPRO) 10 MG tablet, TAKE 1 TABLET BY MOUTH EVERY DAY, Disp: 90 tablet, Rfl: 2 .  Multiple Vitamins-Minerals (MULTIVITAMIN PO), Take 1 tablet by mouth daily., Disp: , Rfl:   EXAM:  VITALS per patient if applicable:  GENERAL: alert, oriented, appears well and in no acute distress  HEENT: atraumatic, conjunttiva clear, no obvious abnormalities on inspection of external nose and ears  NECK: normal movements of the head and neck  LUNGS: on inspection no signs of respiratory distress, breathing rate appears normal, no obvious gross SOB, gasping or wheezing  CV: no obvious cyanosis  MS: moves all visible extremities without noticeable abnormality  PSYCH/NEURO: pleasant and cooperative, no obvious depression or anxiety, speech and thought processing grossly intact  ASSESSMENT  AND PLAN:  Discussed the following assessment and plan:  Hypertension Adequate control. Continue current medication. BMET ordered.   Depression, major, single episode, mild (HCC) Asymptomatic. Continue lexapro. Could consider tapering off in the future if the patient desires.   Hyperlipidemia Continue lipitor.   Prediabetes Check a1c. Continue exercise.     Orders Placed This Encounter  Procedures  . Basic Metabolic Panel (BMET)    Standing Status:   Future    Standing Expiration Date:   11/21/2020  . HgB A1c    Standing Status:   Future    Standing Expiration Date:   11/21/2020    No orders of the defined types were placed in this encounter.    I discussed the assessment and treatment plan with the patient. The patient was provided an opportunity to ask questions and all were answered. The patient agreed with the plan and demonstrated an understanding of the instructions.   The patient was advised to call back or seek an in-person evaluation if the symptoms worsen or if the condition fails to improve as anticipated.   Tommi Rumps, MD

## 2019-11-22 NOTE — Assessment & Plan Note (Signed)
Check a1c. Continue exercise.

## 2019-11-22 NOTE — Assessment & Plan Note (Signed)
Asymptomatic. Continue lexapro. Could consider tapering off in the future if the patient desires.

## 2019-11-24 ENCOUNTER — Telehealth: Payer: Self-pay | Admitting: Family Medicine

## 2019-11-24 NOTE — Telephone Encounter (Signed)
Left vm to set up 3 wk fasting labs and 54m follow up with Dr Chauncey Cruel

## 2019-12-15 ENCOUNTER — Other Ambulatory Visit: Payer: Self-pay

## 2019-12-15 ENCOUNTER — Other Ambulatory Visit (INDEPENDENT_AMBULATORY_CARE_PROVIDER_SITE_OTHER): Payer: Medicare Other

## 2019-12-15 DIAGNOSIS — R7303 Prediabetes: Secondary | ICD-10-CM | POA: Diagnosis not present

## 2019-12-15 DIAGNOSIS — I10 Essential (primary) hypertension: Secondary | ICD-10-CM

## 2019-12-15 LAB — BASIC METABOLIC PANEL
BUN: 20 mg/dL (ref 6–23)
CO2: 27 mEq/L (ref 19–32)
Calcium: 9.4 mg/dL (ref 8.4–10.5)
Chloride: 99 mEq/L (ref 96–112)
Creatinine, Ser: 1.06 mg/dL (ref 0.40–1.50)
GFR: 69.61 mL/min (ref >60.00)
Glucose, Bld: 218 mg/dL — ABNORMAL HIGH (ref 70–99)
Potassium: 4.3 mEq/L (ref 3.5–5.1)
Sodium: 134 mEq/L — ABNORMAL LOW (ref 135–145)

## 2019-12-15 LAB — HEMOGLOBIN A1C: Hgb A1c MFr Bld: 8.1 % — ABNORMAL HIGH (ref 4.6–6.5)

## 2019-12-27 ENCOUNTER — Telehealth: Payer: Self-pay

## 2019-12-27 DIAGNOSIS — R7303 Prediabetes: Secondary | ICD-10-CM

## 2019-12-27 MED ORDER — METFORMIN HCL 500 MG PO TABS: 500.0000 mg | ORAL_TABLET | Freq: Two times a day (BID) | ORAL | 3 refills | Status: DC

## 2019-12-27 NOTE — Telephone Encounter (Signed)
Called and informed patient that he needed to be started on Metformin due to A1C and medication was ordered and sent to pharmacy.  Blair Mesina,cma

## 2020-01-29 ENCOUNTER — Other Ambulatory Visit: Payer: Self-pay | Admitting: Family Medicine

## 2020-01-29 DIAGNOSIS — E782 Mixed hyperlipidemia: Secondary | ICD-10-CM

## 2020-03-21 ENCOUNTER — Encounter: Payer: Self-pay | Admitting: Family Medicine

## 2020-03-21 ENCOUNTER — Other Ambulatory Visit: Payer: Self-pay

## 2020-03-21 ENCOUNTER — Ambulatory Visit (INDEPENDENT_AMBULATORY_CARE_PROVIDER_SITE_OTHER): Payer: Medicare Other | Admitting: Family Medicine

## 2020-03-21 DIAGNOSIS — E119 Type 2 diabetes mellitus without complications: Secondary | ICD-10-CM | POA: Diagnosis not present

## 2020-03-21 DIAGNOSIS — Z794 Long term (current) use of insulin: Secondary | ICD-10-CM | POA: Diagnosis not present

## 2020-03-21 DIAGNOSIS — G319 Degenerative disease of nervous system, unspecified: Secondary | ICD-10-CM

## 2020-03-21 DIAGNOSIS — I10 Essential (primary) hypertension: Secondary | ICD-10-CM | POA: Diagnosis not present

## 2020-03-21 DIAGNOSIS — R270 Ataxia, unspecified: Secondary | ICD-10-CM

## 2020-03-21 LAB — BASIC METABOLIC PANEL
BUN: 24 mg/dL — ABNORMAL HIGH (ref 6–23)
CO2: 27 mEq/L (ref 19–32)
Calcium: 9.9 mg/dL (ref 8.4–10.5)
Chloride: 109 mEq/L (ref 96–112)
Creatinine, Ser: 1.19 mg/dL (ref 0.40–1.50)
GFR: 60.86 mL/min (ref >60.00)
Glucose, Bld: 122 mg/dL — ABNORMAL HIGH (ref 70–99)
Potassium: 4 mEq/L (ref 3.5–5.1)
Sodium: 137 mEq/L (ref 135–145)

## 2020-03-21 LAB — MICROALBUMIN / CREATININE URINE RATIO
Creatinine,U: 88.8 mg/dL
Microalb Creat Ratio: 1.1 mg/g (ref 0.0–30.0)
Microalb, Ur: 1 mg/dL (ref 0.0–1.9)

## 2020-03-21 LAB — HEMOGLOBIN A1C: Hgb A1c MFr Bld: 6.9 % — ABNORMAL HIGH (ref 4.6–6.5)

## 2020-03-21 MED ORDER — AMLODIPINE BESYLATE 5 MG PO TABS: 5.0000 mg | ORAL_TABLET | Freq: Every day | ORAL | 3 refills | Status: DC

## 2020-03-21 NOTE — Progress Notes (Signed)
Tommi Rumps, MD Phone: [phone]  Austin Richards is a 70 y.o. male who presents today for f/u.  Diabetes: Has been checking some sugars recently and it was 270 first thing in the morning recently.  Taking metformin.  Maybe some polyuria.  No polydipsia.  No hypoglycemia.  Exercises at home.  Also watches what he eats.  Generally healthy diet.  Hypertension: Typically in the 678L systolically.  He is taking half a tablet of amlodipine.  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  Has been evaluated by neurology in the past.  They note his gait may be a little worse than previously and he has had several falls over the last year related to this.  His sister notes that he moves too quickly and if he is on an incline that causes more difficulty.  They want to have him do physical therapy.  He has had no injuries with the falls.  Social History   Tobacco Use  Smoking Status Former Smoker  Smokeless Tobacco Never Used  Tobacco Comment   "smoked in my 48s for a short period of time"     ROS see history of present illness  Objective  Physical Exam Vitals:   03/21/20 1057  BP: 130/70  Pulse: 96  Temp: (!) 97.5 F (36.4 C)  SpO2: 98%    BP Readings from Last 3 Encounters:  03/21/20 130/70  08/19/18 128/86  06/01/18 138/80   Wt Readings from Last 3 Encounters:  03/21/20 176 lb 6.4 oz (80 kg)  11/22/19 175 lb (79.4 kg)  08/19/18 165 lb 6.4 oz (75 kg)    Physical Exam Constitutional:      General: He is not in acute distress.    Appearance: He is not diaphoretic.  Cardiovascular:     Rate and Rhythm: Normal rate and regular rhythm.     Heart sounds: Normal heart sounds.  Pulmonary:     Effort: Pulmonary effort is normal.     Breath sounds: Normal breath sounds.  Musculoskeletal:     Right lower leg: No edema.     Left lower leg: No edema.  Skin:    General: Skin is warm and dry.  Neurological:     Mental Status: He is alert.       Assessment/Plan: Please see individual problem list.  Ataxia Refer for physical therapy.  He is already completed evaluation for this.  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  Refer for physical therapy.  He has seen neurology in the past for this.  Diabetes (HCC) Check A1c.  Check urine microalbumin.  Continue Metformin.  Hypertension Slightly above goal.  He will take 5 mg of amlodipine.  Follow-up in 1 month.    Orders Placed This Encounter  Procedures  . Basic Metabolic Panel (BMET)  . HgB A1c  . Urine Microalbumin w/creat. ratio  . Ambulatory referral to Physical Therapy    Referral Priority:   Routine    Referral Type:   Physical Medicine    Referral Reason:   Specialty Services Required    Requested Specialty:   Physical Therapy    Number of Visits Requested:   1    No orders of the defined types were placed in this encounter.   This visit occurred during the SARS-CoV-2 public health emergency.  Safety protocols were in place, including screening questions prior to the visit, additional usage of staff PPE, and extensive cleaning of exam room while observing appropriate contact time as  indicated for disinfecting solutions.    Tommi Rumps, MD [place]

## 2020-03-21 NOTE — Assessment & Plan Note (Signed)
Chronic issue with balance and ataxia.  Refer for physical therapy.  He has seen neurology in the past for this.

## 2020-03-21 NOTE — Assessment & Plan Note (Signed)
Slightly above goal.  He will take 5 mg of amlodipine.  Follow-up in 1 month.

## 2020-03-21 NOTE — Assessment & Plan Note (Signed)
Check A1c.  Check urine microalbumin.  Continue Metformin.

## 2020-03-21 NOTE — Patient Instructions (Signed)
Nice to see you. We will get labs today. Please start taking amlodipine 5 mg once daily.  I have referred you for physical therapy.

## 2020-03-21 NOTE — Assessment & Plan Note (Signed)
Refer for physical therapy.  He is already completed evaluation for this.

## 2020-04-15 ENCOUNTER — Other Ambulatory Visit: Payer: Self-pay

## 2020-04-15 ENCOUNTER — Ambulatory Visit: Payer: Medicare Other | Attending: Family Medicine

## 2020-04-15 DIAGNOSIS — R2689 Other abnormalities of gait and mobility: Secondary | ICD-10-CM | POA: Diagnosis not present

## 2020-04-15 DIAGNOSIS — R2681 Unsteadiness on feet: Secondary | ICD-10-CM | POA: Diagnosis not present

## 2020-04-15 DIAGNOSIS — M6281 Muscle weakness (generalized): Secondary | ICD-10-CM | POA: Diagnosis not present

## 2020-04-15 NOTE — Patient Instructions (Signed)
Access Code: [email or web address] URL: [email or web address] Date: 04/15/2020 Prepared by: Janna Arch  Exercises Heel Toe Raises at [facility] - 1 x daily - 7 x weekly - 2 sets - 10 reps - 5 hold Seated Heel Toe Raises - 1 x daily - 7 x weekly - 2 sets - 10 reps - 5 hold Seated Left Head Turns Vestibular Habituation - 1 x daily - 7 x weekly - 2 sets - 10 reps - 5 hold Standing March with Counter Support - 1 x daily - 7 x weekly - 2 sets - 10 reps - 5 hold Seated Toe Taps - 1 x daily - 7 x weekly - 2 sets - 10 reps - 5 hold Lateral Weight Shift - 1 x daily - 7 x weekly - 2 sets - 10 reps - 5 hold Seated Long Arc Quad - 1 x daily - 7 x weekly - 2 sets - 10 reps - 5 hold Seated Marching with Opposite Shoulder Flexion - 1 x daily - 7 x weekly - 2 sets - 10 reps - 5 hold

## 2020-04-15 NOTE — Therapy (Signed)
[place] [facility]IN [facility] SERVICES [address] [place], [place], [address] Phone: [phone]   Fax:  [phone]  Physical Therapy Evaluation  Patient Details  Name: Austin Richards MRN: [id number] Date of Birth: 08/03/1951 Referring Provider (PT): Dr. Caryl Bis, Angela Adam   Encounter Date: 04/15/2020   PT End of Session - 04/15/20 1312    Visit Number 1    Number of Visits 16    Date for PT Re-Evaluation 06/10/20    Authorization Type 1/10 eval 7/19    Authorization Time Period PT FOTO    PT Start Time 0759    PT Stop Time 0857    PT Time Calculation (min) 58 min    Equipment Utilized During Treatment Gait belt    Activity Tolerance Patient tolerated treatment well    Behavior During Therapy [facility] for tasks assessed/performed           Past Medical History:  Diagnosis Date  . Arthritis   . Ataxia   . 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 Hx of colonic polyp   . Hyperlipidemia   . Migraine   . Pre-diabetes     Past Surgical History:  Procedure Laterality Date  . CHOLECYSTECTOMY    . TOTAL SHOULDER ARTHROPLASTY Right 11/10/2017   Procedure: TOTAL SHOULDER ARTHROPLASTY;  Surgeon: Hiram Gash, MD;  Location: [place];  Service: Orthopedics;  Laterality: Right;    There were no vitals filed for this visit.    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 Patient has a PMH of arthritis, ataxia, CKD (one working kidney), colonic polyp, hyperlipidemia, migraine, pre-diabetes, total shoulder arthroplasty (2019). 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 Patient lives alone but sister lives down the road from him. Per patient report he has a hard time slowing down, falls frequently but is able to catch himself most of the time. At home he furniture surfs but recently got a rollator. Reports no dizziness. Symptoms began in 2015 and progressively worsened.    Limitations Lifting;Standing;Walking;House hold activities    How long can you sit comfortably? n/a    How long can you stand comfortably? a few minutes    How long can you walk comfortably? pain in hip limits him    Patient Stated Goals to get stronger and decrease falls    Currently in Pain? No/denies              [facility] PT Assessment - 04/15/20 0001      Assessment   Medical Diagnosis cerebellar ataxia    Referring Provider (PT) Dr. Caryl Bis, Angela Adam    Onset Date/Surgical Date --   2015   Hand Dominance Right    Prior Therapy no       Precautions   Precautions Other (comment)   hearing aides, reverse R shoulder      Restrictions   Weight Bearing Restrictions No      Balance Screen   Has the patient fallen in the past 6 months Yes    How many times? falls daily  Has the patient had a decrease in activity level because of a fear of falling?  Yes    Is the patient reluctant to leave their home because of a fear of falling?  No      Home Environment   Living Environment Private residence    Living Arrangements Alone    Available Help at Discharge Family    Type of [place] to enter    Entrance Stairs-Number of Steps [place] One level    [place] - 4 wheels      Prior Function   Level of Independence Independent with basic ADLs    Leisure gardening       Observation/Other Assessments   Focus on Therapeutic Outcomes (FOTO)  29%      Standardized Balance Assessment   Standardized Balance Assessment Berg Balance Test      Berg Balance Test   Sit to Stand Able to  stand  independently using hands    Standing Unsupported Able to stand 2 minutes with supervision    Sitting with Back Unsupported but Feet Supported on Floor or Stool Able to sit safely and securely 2 minutes    Stand to Sit Sits safely with minimal use of hands    Transfers Able to transfer safely, definite need of hands    Standing Unsupported with Eyes Closed Unable to keep eyes closed 3 seconds but stays steady    Standing Unsupported with Feet Together Able to place feet together independently but unable to hold for 30 seconds    From Standing, Reach Forward with Outstretched Arm Reaches forward but needs supervision    From Standing Position, Pick up Object from Floor Able to pick up shoe, needs supervision    From Standing Position, Turn to Look Behind Over each Shoulder Needs supervision when turning    Turn 360 Degrees Needs assistance while turning    Standing Unsupported, Alternately Place Feet on Step/Stool Able to complete >2 steps/needs minimal assist    Standing Unsupported, One Foot in Front Needs help to step but can hold 15 seconds    Standing on One Leg Unable to try or needs assist to prevent fall    Total Score 27                   PAIN: Pain in hip limits moiblity: worse 9/10 L medial knee pain : worse 6/10   POSTURE: Seated: functional posture, slight guarding habits of R shoulder Standing: widened BOS, limited stabilization/centralization with frequent sway.  PROM/AROM AROM BLE: WFL  STRENGTH:  Graded on a 0-5 scale Muscle Group Left Right  Hip Flex 4/5 4/5  Hip Abd 4-/5 4-/5  Hip Add 4-/5 4-/5  Hip Ext 4-/5 4-/5  Hip IR/ER 4-/5 4-/5  Knee Flex 4/5 4/5  Knee Ext 4-/5 4-/5  Ankle DF 3+/5 4-/5  Ankle PF 3+/5 4-/5   SENSATION:  COORDINATION: Finger to Nose: Dysmetric  with limited spatial awareness, speeds up as progresses with increasing lack of target obtaining.    Heel slide test: unable to maintain contact on limb with either  LE    FUNCTIONAL MOBILITY: STS: rocking upon standing position, poor eccentric control upon return to seated position.   BALANCE: Dynamic Sitting Balance  Normal Able to sit unsupported and weight shift across midline maximally   Good Able to sit unsupported and weight shift  across midline moderately   Good-/Fair+ Able to sit unsupported and weight shift across midline minimally   Fair Minimal weight shifting ipsilateral/front, difficulty crossing midline   Fair- Reach to ipsilateral side and unable to weight shift x  Poor + Able to sit unsupported with min A and reach to ipsilateral side, unable to weight shift   Poor Able to sit unsupported with mod A and reach ipsilateral/front-can't cross midline     Standing Dynamic Balance  Normal Stand independently unsupported, able to weight shift and cross midline maximally   Good Stand independently unsupported, able to weight shift and cross midline moderately   Good-/Fair+ Stand independently unsupported, able to weight shift across midline minimally   Fair Stand independently unsupported, weight shift, and reach ipsilaterally, loss of balance when crossing midline   Poor+ Able to stand with Min A and reach ipsilaterally, unable to weight shift x  Poor Able to stand with Mod A and minimally reach ipsilaterally, unable to cross midline.     Static Sitting Balance  Normal Able to maintain balance against maximal resistance   Good Able to maintain balance against moderate resistance   Good-/Fair+ Accepts minimal resistance x  Fair Able to sit unsupported without balance loss and without UE support   Poor+ Able to maintain with Minimal assistance from individual or chair   Poor Unable to maintain balance-requires mod/max support from individual or chair     Static Standing Balance  Normal Able to maintain standing balance against maximal resistance   Good Able to maintain standing balance against moderate resistance   Good-/Fair+ Able  to maintain standing balance against minimal resistance   Fair Able to stand unsupported without UE support and without LOB for 1-2 min   Fair- Requires Min A and UE support to maintain standing without loss of balance x  Poor+ Requires mod A and UE support to maintain standing without loss of balance   Poor Requires max A and UE support to maintain standing balance without loss       GAIT: Patient ambulates w/o an AD with wide ataxic gait patterning, frequent weaving L and R, and tremors of LE's with resultant sway.   OUTCOME MEASURES: TEST Outcome Interpretation  5 times sit<>stand 16.04 sec >60 yo, >15 sec indicates increased risk for falls  10 meter walk test      7.03 no AD            m/s <1.0 m/s indicates increased risk for falls; limited community ambulator          [employer] Assessment 27/56  <36/56 (100% risk for falls), 37-45 (80% risk for falls); 46-51 (>50% risk for falls); 52-55 (lower risk <25% of falls)  FOTO 29 Predicted discharge score of 51%         Objective measurements completed on examination: See above findings.     Access Code: [email or web address] URL: [email or web address] Date: 04/15/2020 Prepared by: Janna Arch  Exercises Heel Toe Raises at [facility] - 1 x daily - 7 x weekly - 2 sets - 10 reps - 5 hold Seated Heel Toe Raises - 1 x daily - 7 x weekly - 2 sets - 10 reps - 5 hold Seated Left Head Turns Vestibular Habituation - 1 x daily - 7 x weekly - 2 sets - 10 reps - 5 hold Standing March with Counter Support - 1 x daily - 7 x weekly - 2 sets - 10 reps - 5 hold Seated Toe Taps -  1 x daily - 7 x weekly - 2 sets - 10 reps - 5 hold Lateral Weight Shift - 1 x daily - 7 x weekly - 2 sets - 10 reps - 5 hold Seated Long Arc Quad - 1 x daily - 7 x weekly - 2 sets - 10 reps - 5 hold Seated Marching with Opposite Shoulder Flexion - 1 x daily - 7 x weekly - 2 sets - 10 reps - 5 hold            PT Education - 04/15/20 1309    Education  Details goals, POC, HEP    Person(s) Educated Patient;Caregiver(s)    Methods Explanation;Demonstration;Tactile cues;Verbal cues;Handout    Comprehension Verbalized understanding;Returned demonstration;Verbal cues required;Tactile cues required            PT Short Term Goals - 04/15/20 1315      PT SHORT TERM GOAL #1   Title Patient will be independent in home exercise program to improve strength/mobility for better functional independence with ADLs.    Baseline 7/19: HEP given    Time 2    Period Weeks    Status New    Target Date 04/29/20             PT Long Term Goals - 04/15/20 1316      PT LONG TERM GOAL #1   Title Patient will increase FOTO score to equal to or greater than  51%   to demonstrate statistically significant improvement in mobility and quality of life.    Baseline 7/19: 29%    Time 8    Period Weeks    Status New    Target Date 06/10/20      PT LONG TERM GOAL #2   Title Patient will demonstrate an improved Berg Balance Score of > 33/56  as to demonstrate improved balance with ADLs such as sitting/standing and transfer balance and reduced fall risk.    Baseline 7/19: 27/56    Time 8    Period Weeks    Status New    Target Date 06/10/20      PT LONG TERM GOAL #3   Title Patient will increase BLE gross strength to 4+/5 as to improve functional strength for independent gait, increased standing tolerance and increased ADL ability.    Baseline 7/19: see note    Time 8    Period Weeks    Status New    Target Date 06/10/20      PT LONG TERM GOAL #4   Title Patient will deny any falls over past 4 weeks to demonstrate improved safety awareness at home and in the community.    Baseline 7/19: falls every day    Time 8    Period Weeks    Status New    Target Date 06/10/20                  Plan - 04/15/20 1312    Clinical Impression Statement Patient is pleasant 69 year old male who reads lips and utilizes sign language. He demonstrates  diffuse coordination and limited spatial awareness. Wide BOS with ataxic gait pattern and poor stabilization limits patient's standing as well as occasional pain in bilateral hips. Patient would benefit from skilled physical therapy to improve strength, stability, and coordination for improved mobility and decreased fall risk.    Personal Factors and Comorbidities Comorbidity 3+;Finances;Past/Current Experience;Time since onset of injury/illness/exacerbation;Transportation    Comorbidities arthritis, ataxia, CKD (one working kidney), colonic  polyp, hyperlipidemia, migraine, pre-diabetes, total shoulder arthroplasty (2019)    Examination-Activity Limitations Bathing;Bend;Caring for Others;Carry;Dressing;Hygiene/Grooming;Stairs;Squat;Reach Overhead;Locomotion Level;Lift;Stand;Toileting;Transfers    Examination-Participation Restrictions Church;Cleaning;Community Activity;Driving;Laundry;Volunteer;Shop;Meal Prep;Yard Work    Merchant navy officer Evolving/Moderate complexity    Clinical Decision Making Moderate    Rehab Potential Fair    PT Frequency 2x / week    PT Duration 8 weeks    PT Treatment/Interventions ADLs/Self Care Home Management;Aquatic Therapy;Biofeedback;Cryotherapy;Canalith Repostioning;Electrical Stimulation;Iontophoresis 4mg /ml Dexamethasone;Moist Heat;Traction;Ultrasound;Therapeutic activities;Functional mobility training;Stair training;Gait training;DME Instruction;Therapeutic exercise;Balance training;Neuromuscular re-education;Patient/family education;Manual techniques;Passive range of motion;Dry needling;Energy conservation;Visual/perceptual remediation/compensation;Vestibular    PT Next Visit Plan ankle righting reactions, coordination, balance, strength    PT Home Exercise Plan see above    Consulted and Agree with Plan of Care Patient;Family member/caregiver    Family Member Consulted sister           Patient will benefit from skilled therapeutic intervention  in order to improve the following deficits and impairments:  Abnormal gait, Decreased activity tolerance, Decreased balance, Decreased knowledge of precautions, Decreased endurance, Decreased coordination, Decreased cognition, Decreased knowledge of use of DME, Decreased mobility, Decreased safety awareness, Difficulty walking, Decreased strength, Impaired perceived functional ability, Postural dysfunction, Improper body mechanics  Visit Diagnosis: Unsteadiness on feet  Muscle weakness (generalized)  Other abnormalities of gait and mobility     Problem List Patient Active Problem List   Diagnosis Date Noted  . GERD (gastroesophageal reflux disease) 05/13/2018  . Depression, major, single episode, mild ([place]) 05/13/2018  . 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 Rotator cuff tear arthropathy, right 11/10/2017  . Decreased pedal pulses 11/01/2017  . Gait abnormality 10/21/2017  . Umbilical hernia without obstruction and without gangrene 09/13/2017  . Diabetes ([place]) 07/30/2017  . Hyperlipidemia 07/30/2017  . 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 Right shoulder pain 07/30/2017  . Chronic neck pain 07/30/2017  . Hypertension 07/30/2017   Janna Arch, PT, DPT   04/15/2020, 1:21 PM  [place] [facility]IN [facility] SERVICES [address] [place], [place], [address] Phone: [phone]   Fax:  [phone]  Name: Austin Richards MRN: [id number] Date of Birth: [birth year]

## 2020-04-17 ENCOUNTER — Other Ambulatory Visit: Payer: Self-pay

## 2020-04-17 ENCOUNTER — Ambulatory Visit: Payer: Medicare Other

## 2020-04-17 DIAGNOSIS — R2689 Other abnormalities of gait and mobility: Secondary | ICD-10-CM

## 2020-04-17 DIAGNOSIS — M6281 Muscle weakness (generalized): Secondary | ICD-10-CM | POA: Diagnosis not present

## 2020-04-17 DIAGNOSIS — R2681 Unsteadiness on feet: Secondary | ICD-10-CM | POA: Diagnosis not present

## 2020-04-17 NOTE — Therapy (Signed)
[place] MAIN [facility] SERVICES [address] [place], [place], [address] Phone: [phone]   Fax:  [phone]  Physical Therapy Treatment  Patient Details  Name: Austin Richards MRN: [id number] Date of Birth: 05/12/1951 Referring Provider (PT): Dr. 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   Subjective Assessment - 04/17/20 0806    Subjective Patient presents with rollator, reports no falls, drove himself today.    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 Patient has a PMH of arthritis, ataxia, CKD (one working kidney), colonic polyp, hyperlipidemia, migraine, pre-diabetes, total shoulder arthroplasty (2019). Patient presents with his sister who reports he is very active, walks from trailer to [employer], gardens, Social research officer, government. Patient lives alone but  sister lives down the road from him. Per patient report he has a hard time slowing down, falls frequently but is able to catch himself most of the time. At home he furniture surfs but recently got a rollator. Reports no dizziness. Symptoms began in 2015 and progressively worsened.    Limitations Lifting;Standing;Walking;House hold activities    How long can you sit comfortably? n/a    How long can you stand comfortably? a few minutes    How long can you walk comfortably? pain in hip limits him    Patient Stated Goals to get stronger and decrease falls    Currently in Pain? No/denies              Treatment:   Neuro Re-ed: Close CGA for safety with occasional Min A for return to COM.  In // bars:  Standing with CGA next to support surface:  Airex pad: static stand 30 seconds x 2 trials, noticeable trembling of ankles/LE's with fatigue and challenge to maintain stability Airex pad: horizontal head turns 30 seconds scanning room 10x ; cueing for arc of motion  Airex pad: vertical head turns 30 seconds, cueing for arc of motion, noticeable sway with upward gaze increasing demand on ankle righting reaction musculature Airex pad: one foot on 6" step one foot on airex pad, hold position for 30 seconds, switch legs, 2x each LE;  -  patient reports pain in back increase to 5/10 with airex pad standing.    TherEx: cueing for sequencing and safety awareness for optimal muscle recruitment  Nustep Lvl 3 RPM>70 for cardiovascular and musculoskeletal challenge x4 minutes   Standing with # 5lb  ankle weight: CGA for stability  -Hip extension with bilateral upper extremity support, cueing for neutral hip alignment, upright posture for optimal muscle recruitment, and sequencing, 10x each LE,  -Hip abduction with bilateral upper extremity support, cueing for neutral foot alignment for correct muscle activation, 10x each LE -Hip flexion with bilateral upper extremity support, cueing for body mechanics,  speed of muscle recruitment for optimal strengthening and stabilization 10x each LE -Hamstring curl with bilateral upper extremity support, cueing for knee alignment for recruitment of hamstring musculature, 10x each LE   Seated with # 5lb  ankle weights  -Seated marches with upright posture, back away from back of chair for abdominal/trunk activation/stabilization, 10x each LE -Seated LAQ with 3 second holds, 10x each LE, cueing for muscle activation and sequencing for neutral alignment -Seated IR/ER with cueing for stabilizing knee placement with lateral foot movement for optimal muscle recruitment, 10x each LE  6" step ups/ BUE support. 10x each LE   10x STS with arms crossed from standard heigh chair.     Patient requires mod max cueing for sequencing with frequent visual cue due to limited/absent hearing.                 PT Education - 04/17/20 2116    Education Details exercise technique, body mechanics    Person(s) Educated Patient    Methods Explanation;Demonstration;Tactile cues;Verbal cues    Comprehension Verbalized understanding;Returned demonstration;Verbal cues required;Tactile cues required            PT Short Term Goals - 04/15/20 1315      PT SHORT TERM GOAL #1   Title Patient will be independent in home exercise program to improve strength/mobility for better functional independence with ADLs.    Baseline 7/19: HEP given    Time 2    Period Weeks    Status New    Target Date 04/29/20             PT Long Term Goals - 04/15/20 1316      PT LONG TERM GOAL #1   Title Patient will increase FOTO score to equal to or greater than  51%   to demonstrate statistically significant improvement in mobility and quality of life.    Baseline 7/19: 29%    Time 8    Period Weeks    Status New    Target Date 06/10/20      PT LONG TERM GOAL #2   Title Patient will demonstrate an improved Berg Balance Score of > 33/56  as to demonstrate improved balance with  ADLs such as sitting/standing and transfer balance and reduced fall risk.    Baseline 7/19: 27/56    Time 8    Period Weeks    Status New    Target Date 06/10/20      PT LONG TERM GOAL #3   Title Patient will increase BLE gross strength to 4+/5 as to improve functional strength for independent gait, increased standing tolerance and increased ADL ability.    Baseline 7/19: see note    Time 8    Period Weeks    Status New    Target Date 06/10/20      PT LONG TERM GOAL #4  Title Patient will deny any falls over past 4 weeks to demonstrate improved safety awareness at home and in the community.    Baseline 7/19: falls every day    Time 8    Period Weeks    Status New    Target Date 06/10/20                 Plan - 04/17/20 2123    Clinical Impression Statement Patient presents with excellent motivation. Patient tolerated strengthening and stability interventions well requiring occasional rest breaks from fatigue. Stability with head turns as well as unstable surfaces is very challenging with frequent posterior LOB. Patient would benefit from skilled physical therapy to improve strength, stability, and coordination for improved mobility and decreased fall risk.    Personal Factors and Comorbidities Comorbidity 3+;Finances;Past/Current Experience;Time since onset of injury/illness/exacerbation;Transportation    Comorbidities arthritis, ataxia, CKD (one working kidney), colonic polyp, hyperlipidemia, migraine, pre-diabetes, total shoulder arthroplasty (2019)    Examination-Activity Limitations Bathing;Bend;Caring for Others;Carry;Dressing;Hygiene/Grooming;Stairs;Squat;Reach Overhead;Locomotion Level;Lift;Stand;Toileting;Transfers    Examination-Participation Restrictions Church;Cleaning;Community Activity;Driving;Laundry;Volunteer;Shop;Meal Prep;Yard Work    Merchant navy officer Evolving/Moderate complexity    Rehab Potential Fair    PT Frequency 2x / week    PT  Duration 8 weeks    PT Treatment/Interventions ADLs/Self Care Home Management;Aquatic Therapy;Biofeedback;Cryotherapy;Canalith Repostioning;Electrical Stimulation;Iontophoresis 4mg /ml Dexamethasone;Moist Heat;Traction;Ultrasound;Therapeutic activities;Functional mobility training;Stair training;Gait training;DME Instruction;Therapeutic exercise;Balance training;Neuromuscular re-education;Patient/family education;Manual techniques;Passive range of motion;Dry needling;Energy conservation;Visual/perceptual remediation/compensation;Vestibular    PT Next Visit Plan ankle righting reactions, coordination, balance, strength    PT Home Exercise Plan see above    Consulted and Agree with Plan of Care Patient;Family member/caregiver    Family Member Consulted sister           Patient will benefit from skilled therapeutic intervention in order to improve the following deficits and impairments:  Abnormal gait, Decreased activity tolerance, Decreased balance, Decreased knowledge of precautions, Decreased endurance, Decreased coordination, Decreased cognition, Decreased knowledge of use of DME, Decreased mobility, Decreased safety awareness, Difficulty walking, Decreased strength, Impaired perceived functional ability, Postural dysfunction, Improper body mechanics  Visit Diagnosis: Unsteadiness on feet  Muscle weakness (generalized)  Other abnormalities of gait and mobility     Problem List Patient Active Problem List   Diagnosis Date Noted   GERD (gastroesophageal reflux disease) 05/13/2018   Depression, major, single episode, mild ([place]) 05/13/2018   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 [place], [place], [address] Phone: [phone]   Fax:  [phone]  Name: Austin Richards MRN: [id number] Date of Birth: Jul 30, 1951

## 2020-04-22 ENCOUNTER — Ambulatory Visit: Payer: Medicare Other | Admitting: Family Medicine

## 2020-04-22 ENCOUNTER — Ambulatory Visit: Payer: Medicare Other

## 2020-04-24 ENCOUNTER — Ambulatory Visit: Payer: Medicare Other

## 2020-04-27 ENCOUNTER — Other Ambulatory Visit: Payer: Self-pay | Admitting: Family Medicine

## 2020-04-30 ENCOUNTER — Other Ambulatory Visit: Payer: Self-pay

## 2020-04-30 ENCOUNTER — Ambulatory Visit: Payer: Medicare Other | Attending: Family Medicine

## 2020-04-30 DIAGNOSIS — R2681 Unsteadiness on feet: Secondary | ICD-10-CM | POA: Insufficient documentation

## 2020-04-30 DIAGNOSIS — M6281 Muscle weakness (generalized): Secondary | ICD-10-CM | POA: Diagnosis not present

## 2020-04-30 DIAGNOSIS — R2689 Other abnormalities of gait and mobility: Secondary | ICD-10-CM | POA: Insufficient documentation

## 2020-04-30 NOTE — Therapy (Signed)
[place] MAIN [facility] SERVICES [address] [place], [place], [address] Phone: [phone]   Fax:  [phone]  Physical Therapy Treatment  Patient Details  Name: Austin Richards MRN: [id number] Date of Birth: 06-10-1951 Referring Provider (PT): Dr. Leone Haven   Encounter Date: 04/30/2020   PT End of Session - 04/30/20 1219    Visit Number 3    Number of Visits 16    Date for PT Re-Evaluation 06/10/20    Authorization Type 3/10 eval 7/19    Authorization Time Period PT FOTO    PT Start Time 1102    PT Stop Time 1146    PT Time Calculation (min) 44 min    Equipment Utilized During Treatment Gait belt    Activity Tolerance Patient tolerated treatment well    Behavior During Therapy [facility] for tasks assessed/performed           Past Medical History:  Diagnosis Date  . Arthritis   . Ataxia   . 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 Hx of colonic polyp   . Hyperlipidemia   . Migraine   . Pre-diabetes     Past Surgical History:  Procedure Laterality Date  . CHOLECYSTECTOMY    . TOTAL SHOULDER ARTHROPLASTY Right 11/10/2017   Procedure: TOTAL SHOULDER ARTHROPLASTY;  Surgeon: Hiram Gash, MD;  Location: [place];  Service: Orthopedics;  Laterality: Right;    There were no vitals filed for this visit.   Subjective Assessment - 04/30/20 1217    Subjective Patient was absent last week due to helping take care of a family with dementia. Patient is able to read therapists lips with use of clear mask. No falls or LOB since last session.    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 Patient has a PMH of arthritis, ataxia, CKD (one working kidney), colonic polyp, hyperlipidemia, migraine, pre-diabetes, total shoulder arthroplasty (2019). Patient presents with  his sister who reports he is very active, walks from trailer to [employer], gardens, Social research officer, government. Patient lives alone but sister lives down the road from him. Per patient report he has a hard time slowing down, falls frequently but is able to catch himself most of the time. At home he furniture surfs but recently got a rollator. Reports no dizziness. Symptoms began in 2015 and progressively worsened.    Limitations Lifting;Standing;Walking;House hold activities    How long can you sit comfortably? n/a    How long can you stand comfortably? a few minutes    How long can you walk comfortably? pain in hip limits him    Patient Stated Goals to get stronger and decrease falls    Currently in Pain? No/denies               In hallway: -horizontal head turns 86 ft with occasional LOB requiring close CGA -vertical head turns 86 ft with three near LOB requiring Min A to retain COM -dual task of ambulation with listing grocery list verbally 86 ft ,signing 86 ft, increased ataxia and instability/ scissoring  Standing next to support surface: -Heel raise 10x bilaterally, 10x single LE at a time -hedgehog taps with decreasing UE support from SUE to hand flat to two fingers to one finger for 15x each LE total -6" step step ups  BUE support 12x each LE -6" step eccentric heel taps 12x each LE, BUE support.  -airex pad 6" step modified tandem stance 2x 60 seconds each foot placement.  -airex pad weighted ball (2000 gr)  Chest press 12x  Overhead press 15x multiple near LOB posterior  Horizontal twists 10x each side  Sit to stand with weighted ball (2000 gr) 10x   Seated: GTB around ankles with green ball between knees: alternating IR/ER 10x each LE     Patient requires mod max cueing for sequencing with frequent visual cue due to limited/absent hearing.     Patient is very motivated throughout physical therapy session. He is impulsive however and requires cueing for body mechanics and sequencing in  regards to safety. Patient challenged with dual task with increased scissor pattern of ambulation. Patient would benefit from skilled physical therapy to improve strength, stability, and coordination for improved mobility and decreased fall risk.            PT Education - 04/30/20 1218    Education Details exercise technique, body mechanics    Person(s) Educated Patient    Methods Explanation;Demonstration;Tactile cues;Verbal cues    Comprehension Verbalized understanding;Returned demonstration;Verbal cues required;Tactile cues required            PT Short Term Goals - 04/15/20 1315      PT SHORT TERM GOAL #1   Title Patient will be independent in home exercise program to improve strength/mobility for better functional independence with ADLs.    Baseline 7/19: HEP given    Time 2    Period Weeks    Status New    Target Date 04/29/20             PT Long Term Goals - 04/15/20 1316      PT LONG TERM GOAL #1   Title Patient will increase FOTO score to equal to or greater than  51%   to demonstrate statistically significant improvement in mobility and quality of life.    Baseline 7/19: 29%    Time 8    Period Weeks    Status New    Target Date 06/10/20      PT LONG TERM GOAL #2   Title Patient will demonstrate an improved Berg Balance Score of > 33/56  as to demonstrate improved balance with ADLs such as sitting/standing and transfer balance and reduced fall risk.    Baseline 7/19: 27/56    Time 8    Period Weeks    Status New    Target Date 06/10/20      PT LONG TERM GOAL #3   Title Patient will increase BLE gross strength to 4+/5 as to improve functional strength for independent gait, increased standing tolerance and increased ADL ability.    Baseline 7/19: see note    Time 8    Period Weeks    Status New    Target Date 06/10/20      PT LONG TERM GOAL #4   Title Patient will deny any falls over past 4 weeks to demonstrate improved safety awareness at home  and in the community.    Baseline 7/19: falls every day    Time 8    Period Weeks    Status New    Target Date 06/10/20                 Plan - 04/30/20 1221    Clinical Impression Statement Patient is very motivated throughout physical therapy session. He  is impulsive however and requires cueing for body mechanics and sequencing in regards to safety. Patient challenged with dual task with increased scissor pattern of ambulation. Patient would benefit from skilled physical therapy to improve strength, stability, and coordination for improved mobility and decreased fall risk.    Personal Factors and Comorbidities Comorbidity 3+;Finances;Past/Current Experience;Time since onset of injury/illness/exacerbation;Transportation    Comorbidities arthritis, ataxia, CKD (one working kidney), colonic polyp, hyperlipidemia, migraine, pre-diabetes, total shoulder arthroplasty (2019)    Examination-Activity Limitations Bathing;Bend;Caring for Others;Carry;Dressing;Hygiene/Grooming;Stairs;Squat;Reach Overhead;Locomotion Level;Lift;Stand;Toileting;Transfers    Examination-Participation Restrictions Church;Cleaning;Community Activity;Driving;Laundry;Volunteer;Shop;Meal Prep;Yard Work    Merchant navy officer Evolving/Moderate complexity    Rehab Potential Fair    PT Frequency 2x / week    PT Duration 8 weeks    PT Treatment/Interventions ADLs/Self Care Home Management;Aquatic Therapy;Biofeedback;Cryotherapy;Canalith Repostioning;Electrical Stimulation;Iontophoresis 4mg /ml Dexamethasone;Moist Heat;Traction;Ultrasound;Therapeutic activities;Functional mobility training;Stair training;Gait training;DME Instruction;Therapeutic exercise;Balance training;Neuromuscular re-education;Patient/family education;Manual techniques;Passive range of motion;Dry needling;Energy conservation;Visual/perceptual remediation/compensation;Vestibular    PT Next Visit Plan ankle righting reactions, coordination,  balance, strength    PT Home Exercise Plan see above    Consulted and Agree with Plan of Care Patient;Family member/caregiver    Family Member Consulted sister           Patient will benefit from skilled therapeutic intervention in order to improve the following deficits and impairments:  Abnormal gait, Decreased activity tolerance, Decreased balance, Decreased knowledge of precautions, Decreased endurance, Decreased coordination, Decreased cognition, Decreased knowledge of use of DME, Decreased mobility, Decreased safety awareness, Difficulty walking, Decreased strength, Impaired perceived functional ability, Postural dysfunction, Improper body mechanics  Visit Diagnosis: Unsteadiness on feet  Muscle weakness (generalized)  Other abnormalities of gait and mobility     Problem List Patient Active Problem List   Diagnosis Date Noted  . GERD (gastroesophageal reflux disease) 05/13/2018  . Depression, major, single episode, mild ([place]) 05/13/2018  . 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 Rotator cuff tear arthropathy, right 11/10/2017  . Decreased pedal pulses 11/01/2017  . Gait abnormality 10/21/2017  . Umbilical hernia without obstruction and without gangrene 09/13/2017  . Diabetes ([place]) 07/30/2017  . Hyperlipidemia 07/30/2017  . 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 Right shoulder pain 07/30/2017  . Chronic neck pain 07/30/2017  . Hypertension 07/30/2017   Janna Arch, PT, DPT   04/30/2020, 12:23 PM  [place] MAIN [facility] SERVICES [address] [place], [place], [address] Phone: [phone]   Fax:  [phone]  Name: Austin Richards MRN: [id number] Date of Birth: 05-30-1951

## 2020-05-01 ENCOUNTER — Encounter: Payer: Self-pay | Admitting: Family Medicine

## 2020-05-01 ENCOUNTER — Ambulatory Visit (INDEPENDENT_AMBULATORY_CARE_PROVIDER_SITE_OTHER): Payer: Medicare Other | Admitting: Family Medicine

## 2020-05-01 ENCOUNTER — Other Ambulatory Visit: Payer: Self-pay

## 2020-05-01 DIAGNOSIS — Z794 Long term (current) use of insulin: Secondary | ICD-10-CM | POA: Diagnosis not present

## 2020-05-01 DIAGNOSIS — E782 Mixed hyperlipidemia: Secondary | ICD-10-CM

## 2020-05-01 DIAGNOSIS — E119 Type 2 diabetes mellitus without complications: Secondary | ICD-10-CM

## 2020-05-01 DIAGNOSIS — G319 Degenerative disease of nervous system, unspecified: Secondary | ICD-10-CM | POA: Diagnosis not present

## 2020-05-01 DIAGNOSIS — I10 Essential (primary) hypertension: Secondary | ICD-10-CM | POA: Diagnosis not present

## 2020-05-01 MED ORDER — ATORVASTATIN CALCIUM 80 MG PO TABS: 80.0000 mg | ORAL_TABLET | Freq: Every day | ORAL | 1 refills | Status: DC

## 2020-05-01 NOTE — Assessment & Plan Note (Signed)
Adequate control.  Continue current medication. 

## 2020-05-01 NOTE — Patient Instructions (Signed)
Nice to see you. Please continue to check your blood pressure. I will see you back in 3 months.

## 2020-05-01 NOTE — Assessment & Plan Note (Signed)
Improved control on last check.  Continue Metformin.  Foot exam completed today.

## 2020-05-01 NOTE — Progress Notes (Signed)
  Tommi Rumps, MD Phone: [phone]  Austin Richards is a 69 y.o. male who presents today for f/u.  HYPERTENSION  Disease Monitoring  Home BP Monitoring 130/70 Chest pain- no    Dyspnea- no Medications  Compliance-  Taking amlodipine.   Edema- no  DIABETES Disease Monitoring: Blood Sugar ranges-not checking Polyuria/phagia/dipsia- no     Medications: Compliance- taking metformin Hypoglycemic symptoms- no  Ataxia: Patient has chronic issues with this.  He has been evaluated by neurology previously.  Has been doing physical therapy recently with some benefit.  He is walking with a 4 wheeled walker.     Social History   Tobacco Use  Smoking Status Former Smoker  Smokeless Tobacco Never Used  Tobacco Comment   "smoked in my 62s for a short period of time"     ROS see history of present illness  Objective  Physical Exam Vitals:   05/01/20 1119  BP: 130/70  Pulse: 85  Temp: 98.6 F (37 C)  SpO2: 98%    BP Readings from Last 3 Encounters:  05/01/20 130/70  03/21/20 130/70  08/19/18 128/86   Wt Readings from Last 3 Encounters:  05/01/20 178 lb 6.4 oz (80.9 kg)  03/21/20 176 lb 6.4 oz (80 kg)  11/22/19 175 lb (79.4 kg)    Physical Exam Constitutional:      General: He is not in acute distress.    Appearance: He is not diaphoretic.  Cardiovascular:     Rate and Rhythm: Normal rate and regular rhythm.     Heart sounds: Normal heart sounds.  Pulmonary:     Effort: Pulmonary effort is normal.     Breath sounds: Normal breath sounds.  Musculoskeletal:     Right lower leg: No edema.     Left lower leg: No edema.  Skin:    General: Skin is warm and dry.  Neurological:     Mental Status: He is alert.    Diabetic Foot Exam - Simple   Simple Foot Form Diabetic Foot exam was performed with the following findings: Yes 05/01/2020 11:17 AM  Visual Inspection See comments: Yes Sensation Testing Intact to touch and monofilament testing bilaterally:  Yes Pulse Check Posterior Tibialis and Dorsalis pulse intact bilaterally: Yes Comments Hallux valgus right foot, pes planus bilaterally, no other deformities, ulcerations, or skin breakdown.       Assessment/Plan: Please see individual problem list.  Hypertension Adequate control.  Continue current medication.  Diabetes ([place]) Improved control on last check.  Continue Metformin.  Foot exam completed today.  Cerebellar atrophy He will continue physical therapy.   No orders of the defined types were placed in this encounter.   Meds ordered this encounter  Medications  . atorvastatin (LIPITOR) 80 MG tablet    Sig: Take 1 tablet (80 mg total) by mouth daily at 6 PM.    Dispense:  90 tablet    Refill:  1    This visit occurred during the SARS-CoV-2 public health emergency.  Safety protocols were in place, including screening questions prior to the visit, additional usage of staff PPE, and extensive cleaning of exam room while observing appropriate contact time as indicated for disinfecting solutions.    Tommi Rumps, MD [place]

## 2020-05-01 NOTE — Assessment & Plan Note (Signed)
He will continue physical therapy.

## 2020-05-03 ENCOUNTER — Ambulatory Visit: Payer: Medicare Other

## 2020-05-03 ENCOUNTER — Other Ambulatory Visit: Payer: Self-pay

## 2020-05-03 DIAGNOSIS — R2689 Other abnormalities of gait and mobility: Secondary | ICD-10-CM | POA: Diagnosis not present

## 2020-05-03 DIAGNOSIS — M6281 Muscle weakness (generalized): Secondary | ICD-10-CM | POA: Diagnosis not present

## 2020-05-03 DIAGNOSIS — R2681 Unsteadiness on feet: Secondary | ICD-10-CM

## 2020-05-03 NOTE — Therapy (Signed)
[place] MAIN [facility] SERVICES [address] [place], [place], [address] Phone: [phone]   Fax:  [phone]  Physical Therapy Treatment  Patient Details  Name: Austin Richards MRN: [id number] Date of Birth: 09-22-1951 Referring Provider (PT): Dr. 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   Subjective Assessment - 05/03/20 1146    Subjective Pt reports he has not fallen since last PT appointment.  He does have difficulty with his balance walking on the "stepping stones" in his yard, but he catches himself from falling.  He strained his back helping his sister lift something this week, but has used a topical cream on it and it is beginning to feel better- he just wanted to let PT know about it upon arrival.    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 Patient has a PMH of arthritis, ataxia, CKD (one working kidney), colonic polyp, hyperlipidemia, migraine, pre-diabetes, total shoulder  arthroplasty (2019). Patient presents with his sister who reports he is very active, walks from trailer to [employer], gardens, Social research officer, government. Patient lives alone but sister lives down the road from him. Per patient report he has a hard time slowing down, falls frequently but is able to catch himself most of the time. At home he furniture surfs but recently got a rollator. Reports no dizziness. Symptoms began in 2015 and progressively worsened.    Limitations Lifting;Standing;Walking;House hold activities    How long can you sit comfortably? n/a    How long can you stand comfortably? a few minutes    How long can you walk comfortably? pain in hip limits him    Patient Stated Goals to get stronger and decrease falls    Currently in Pain? No/denies                 Treatment Today:  -amb 2 laps CW and 2 laps CCW around gym with PT CGA; pt had 1 episode of stepping across midline to regain balance  Standing next to support surface: -Heel raise 10x bilaterally, 10x single LE at a time -hedgehog taps with decreasing UE support from SUE to hand flat to two fingers to one finger for 15x each LE total -6" step step ups BUE support 12x each LE -6" step eccentric heel  taps 12x each LE, BUE support -stair ascend/descend with reciprocal gait 4 laps -airex pad 6" step modified tandem stance 2x 60 seconds each foot placement.  -airex pad weighted ball (2000 gr)             Chest press 12x             Overhead press 15x multiple near LOB posterior             Horizontal twists 10x each side passing ball to PT R/L  Sit to stand with weighted ball (2000 gr) 10x   Seated: GTB around ankles with green ball between knees: alternating IR/ER 10x each LE   Patient requires mod max cueing for sequencing with frequent visual cue due to limited/absent hearing. PT wore clear mask so pt could lip read during session.       PT Education - 05/03/20 1148    Education Details exercise technique/form/sequencing     Person(s) Educated Patient    Methods Explanation;Demonstration;Verbal cues    Comprehension Verbalized understanding;Returned demonstration;Verbal cues required;Tactile cues required;Need further instruction            PT Short Term Goals - 04/15/20 1315      PT SHORT TERM GOAL #1   Title Patient will be independent in home exercise program to improve strength/mobility for better functional independence with ADLs.    Baseline 7/19: HEP given    Time 2    Period Weeks    Status New    Target Date 04/29/20             PT Long Term Goals - 04/15/20 1316      PT LONG TERM GOAL #1   Title Patient will increase FOTO score to equal to or greater than  51%   to demonstrate statistically significant improvement in mobility and quality of life.    Baseline 7/19: 29%    Time 8    Period Weeks    Status New    Target Date 06/10/20      PT LONG TERM GOAL #2   Title Patient will demonstrate an improved Berg Balance Score of > 33/56  as to demonstrate improved balance with ADLs such as sitting/standing and transfer balance and reduced fall risk.    Baseline 7/19: 27/56    Time 8    Period Weeks    Status New    Target Date 06/10/20      PT LONG TERM GOAL #3   Title Patient will increase BLE gross strength to 4+/5 as to improve functional strength for independent gait, increased standing tolerance and increased ADL ability.    Baseline 7/19: see note    Time 8    Period Weeks    Status New    Target Date 06/10/20      PT LONG TERM GOAL #4   Title Patient will deny any falls over past 4 weeks to demonstrate improved safety awareness at home and in the community.    Baseline 7/19: falls every day    Time 8    Period Weeks    Status New    Target Date 06/10/20                 Plan - 05/03/20 1150    Clinical Impression Statement Pt was able to participate in PT session today without being limited by his report of low back pain.  He was very challenged with balance  activities and remains impulsive (  initiating the task before fully instructed) especially when beginning a new exercise.  PT remained CGA throughout session with gait belt and pt required occasional assistance to regain balance on airex pad with dual tasking activities.  Pt would benefit from skilled PT to improve strength, stability, and coordination for improved mobility and decreased fall risk.    Personal Factors and Comorbidities Comorbidity 3+;Finances;Past/Current Experience;Time since onset of injury/illness/exacerbation;Transportation    Comorbidities arthritis, ataxia, CKD (one working kidney), colonic polyp, hyperlipidemia, migraine, pre-diabetes, total shoulder arthroplasty (2019)    Examination-Activity Limitations Bathing;Bend;Caring for Others;Carry;Dressing;Hygiene/Grooming;Stairs;Squat;Reach Overhead;Locomotion Level;Lift;Stand;Toileting;Transfers    Examination-Participation Restrictions Church;Cleaning;Community Activity;Driving;Laundry;Volunteer;Shop;Meal Prep;Yard Work    Merchant navy officer Evolving/Moderate complexity    Rehab Potential Fair    PT Frequency 2x / week    PT Duration 8 weeks    PT Treatment/Interventions ADLs/Self Care Home Management;Aquatic Therapy;Biofeedback;Cryotherapy;Canalith Repostioning;Electrical Stimulation;Iontophoresis 4mg /ml Dexamethasone;Moist Heat;Traction;Ultrasound;Therapeutic activities;Functional mobility training;Stair training;Gait training;DME Instruction;Therapeutic exercise;Balance training;Neuromuscular re-education;Patient/family education;Manual techniques;Passive range of motion;Dry needling;Energy conservation;Visual/perceptual remediation/compensation;Vestibular    PT Next Visit Plan ankle righting reactions, coordination, balance, strength    PT Home Exercise Plan see above    Consulted and Agree with Plan of Care Patient;Family member/caregiver    Family Member Consulted sister           Patient will benefit from  skilled therapeutic intervention in order to improve the following deficits and impairments:  Abnormal gait, Decreased activity tolerance, Decreased balance, Decreased knowledge of precautions, Decreased endurance, Decreased coordination, Decreased cognition, Decreased knowledge of use of DME, Decreased mobility, Decreased safety awareness, Difficulty walking, Decreased strength, Impaired perceived functional ability, Postural dysfunction, Improper body mechanics  Visit Diagnosis: Unsteadiness on feet  Muscle weakness (generalized)  Other abnormalities of gait and mobility     Problem List Patient Active Problem List   Diagnosis Date Noted   GERD (gastroesophageal reflux disease) 05/13/2018   Depression, major, single episode, mild ([place]) 05/13/2018   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[place]) 01/04/2018   Rotator cuff tear arthropathy, right 11/10/2017   Decreased pedal pulses 11/01/2017   Gait abnormality 93/73/4287   Umbilical hernia without obstruction and without gangrene 09/13/2017   Diabetes ([place]) 07/30/2017   Hyperlipidemia 07/30/2017   Ataxia 07/30/2017   Right shoulder pain 07/30/2017   Chronic neck pain 07/30/2017   Hypertension 07/30/2017    Pincus Badder 05/03/2020, 11:58 AM Merdis Delay, PT, DPT Physical Therapist - [facility]  Outpatient Physical Therapy- [place] [facility] MAIN [facility] SERVICES [address] [place], [place], [address] Phone: [phone]   Fax:  [phone]  Name: Austin Richards MRN: [id number] Date of Birth: 1951/08/14

## 2020-05-06 ENCOUNTER — Ambulatory Visit: Payer: Medicare Other

## 2020-05-06 ENCOUNTER — Other Ambulatory Visit: Payer: Self-pay

## 2020-05-06 DIAGNOSIS — R2689 Other abnormalities of gait and mobility: Secondary | ICD-10-CM | POA: Diagnosis not present

## 2020-05-06 DIAGNOSIS — M6281 Muscle weakness (generalized): Secondary | ICD-10-CM

## 2020-05-06 DIAGNOSIS — R2681 Unsteadiness on feet: Secondary | ICD-10-CM | POA: Diagnosis not present

## 2020-05-06 NOTE — Therapy (Signed)
[place] MAIN [facility] SERVICES [address] [place], [place], [address] Phone: [phone]   Fax:  [phone]  Physical Therapy Treatment  Patient Details  Name: Austin Richards MRN: [id number] Date of Birth: 19-Nov-1950 Referring Provider (PT): Dr. Caryl Bis, Angela Adam   Encounter Date: 05/06/2020   PT End of Session - 05/06/20 0754    Visit Number 5    Number of Visits 16    Date for PT Re-Evaluation 06/10/20    Authorization Type 5/10 eval 7/19    Authorization Time Period PT FOTO    PT Start Time 0759    PT Stop Time 0843    PT Time Calculation (min) 44 min    Equipment Utilized During Treatment Gait belt    Activity Tolerance Patient tolerated treatment well    Behavior During Therapy [facility] for tasks assessed/performed           Past Medical History:  Diagnosis Date  . Arthritis   . Ataxia   . 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 Hx of colonic polyp   . Hyperlipidemia   . Migraine   . Pre-diabetes     Past Surgical History:  Procedure Laterality Date  . CHOLECYSTECTOMY    . TOTAL SHOULDER ARTHROPLASTY Right 11/10/2017   Procedure: TOTAL SHOULDER ARTHROPLASTY;  Surgeon: Hiram Gash, MD;  Location: [place];  Service: Orthopedics;  Laterality: Right;    There were no vitals filed for this visit.   Subjective Assessment - 05/06/20 0802    Subjective Patient reports he did some of his pool exercises yeterday, back pain still is bothering him but he has been putting cream on. No falls or LOB since last session.    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 Patient has a PMH of arthritis, ataxia, CKD (one working kidney), colonic polyp, hyperlipidemia, migraine, pre-diabetes, total shoulder arthroplasty (2019). Patient presents with his sister who  reports he is very active, walks from trailer to [employer], gardens, Social research officer, government. Patient lives alone but sister lives down the road from him. Per patient report he has a hard time slowing down, falls frequently but is able to catch himself most of the time. At home he furniture surfs but recently got a rollator. Reports no dizziness. Symptoms began in 2015 and progressively worsened.    Limitations Lifting;Standing;Walking;House hold activities    How long can you sit comfortably? n/a    How long can you stand comfortably? a few minutes    How long can you walk comfortably? pain in hip limits him    Patient Stated Goals to get stronger and decrease falls    Currently in Pain? Yes    Pain Score 5     Pain Location Back    Pain Orientation Lower    Pain Descriptors / Indicators Aching    Pain Type Chronic pain    Pain Onset More than a month ago    Pain Frequency Intermittent             Nustep Lvl 5 RPM> 60  3 minutes for cardiovascular support.     Standing next to support surface: 5lb ankle weight: -hip flexion/marching 15x each LE, SUE support  -hip abduction 15x each LE, BUE support  -hip extension 15x each LE, BUE  support -hamstring curl 15x each LE, BUE support  -Heel raise 10x bilaterally, 10x single LE at a time  -airex pad 6" step modified tandem stance 2x 60 seconds each foot placement.  -airex pad static stand 30 second holds x 2 trials -airex pad static stand with horizontal head turns 15x  -SUE support cone tap with mod cueing for sequencing 8x each LE, challenge with sequencing/cues  -modified lateral lunge onto bosu BUE support 10x each LE   -single leg heel raise with BUE support 15x  Seated: 5lb ankle weight:  -LAQ 15x each LE; 3 second holds -march with arms crossed 15x each LE -ER/IR with green ball between knees 15x each side alternating   Patient requires mod max cueing for sequencing with frequent visual cue due to limited/absent hearing.                          PT Education - 05/06/20 0754    Education Details exercise technique, body mechanics    Person(s) Educated Patient    Methods Explanation;Demonstration;Tactile cues;Verbal cues    Comprehension Verbalized understanding;Returned demonstration;Verbal cues required;Tactile cues required            PT Short Term Goals - 04/15/20 1315      PT SHORT TERM GOAL #1   Title Patient will be independent in home exercise program to improve strength/mobility for better functional independence with ADLs.    Baseline 7/19: HEP given    Time 2    Period Weeks    Status New    Target Date 04/29/20             PT Long Term Goals - 04/15/20 1316      PT LONG TERM GOAL #1   Title Patient will increase FOTO score to equal to or greater than  51%   to demonstrate statistically significant improvement in mobility and quality of life.    Baseline 7/19: 29%    Time 8    Period Weeks    Status New    Target Date 06/10/20      PT LONG TERM GOAL #2   Title Patient will demonstrate an improved Berg Balance Score of > 33/56  as to demonstrate improved balance with ADLs such as sitting/standing and transfer balance and reduced fall risk.    Baseline 7/19: 27/56    Time 8    Period Weeks    Status New    Target Date 06/10/20      PT LONG TERM GOAL #3   Title Patient will increase BLE gross strength to 4+/5 as to improve functional strength for independent gait, increased standing tolerance and increased ADL ability.    Baseline 7/19: see note    Time 8    Period Weeks    Status New    Target Date 06/10/20      PT LONG TERM GOAL #4   Title Patient will deny any falls over past 4 weeks to demonstrate improved safety awareness at home and in the community.    Baseline 7/19: falls every day    Time 8    Period Weeks    Status New    Target Date 06/10/20                 Plan - 05/06/20 0819    Clinical Impression Statement Patient is  challenged with repeated motions against resistance with fatigue in hips resulting in decreased stability and  increased need for UE support. He does continue to demonstrate impulsive behavior requiring cueing for safety awareness and waiting for full instructions prior to task performance. Patient would benefit from skilled physical therapy to improve strength, stability, and coordination for improved mobility and decreased fall risk.    Personal Factors and Comorbidities Comorbidity 3+;Finances;Past/Current Experience;Time since onset of injury/illness/exacerbation;Transportation    Comorbidities arthritis, ataxia, CKD (one working kidney), colonic polyp, hyperlipidemia, migraine, pre-diabetes, total shoulder arthroplasty (2019)    Examination-Activity Limitations Bathing;Bend;Caring for Others;Carry;Dressing;Hygiene/Grooming;Stairs;Squat;Reach Overhead;Locomotion Level;Lift;Stand;Toileting;Transfers    Examination-Participation Restrictions Church;Cleaning;Community Activity;Driving;Laundry;Volunteer;Shop;Meal Prep;Yard Work    Merchant navy officer Evolving/Moderate complexity    Rehab Potential Fair    PT Frequency 2x / week    PT Duration 8 weeks    PT Treatment/Interventions ADLs/Self Care Home Management;Aquatic Therapy;Biofeedback;Cryotherapy;Canalith Repostioning;Electrical Stimulation;Iontophoresis 4mg /ml Dexamethasone;Moist Heat;Traction;Ultrasound;Therapeutic activities;Functional mobility training;Stair training;Gait training;DME Instruction;Therapeutic exercise;Balance training;Neuromuscular re-education;Patient/family education;Manual techniques;Passive range of motion;Dry needling;Energy conservation;Visual/perceptual remediation/compensation;Vestibular    PT Next Visit Plan ankle righting reactions, coordination, balance, strength    PT Home Exercise Plan see above    Consulted and Agree with Plan of Care Patient;Family member/caregiver    Family Member Consulted sister            Patient will benefit from skilled therapeutic intervention in order to improve the following deficits and impairments:  Abnormal gait, Decreased activity tolerance, Decreased balance, Decreased knowledge of precautions, Decreased endurance, Decreased coordination, Decreased cognition, Decreased knowledge of use of DME, Decreased mobility, Decreased safety awareness, Difficulty walking, Decreased strength, Impaired perceived functional ability, Postural dysfunction, Improper body mechanics  Visit Diagnosis: Unsteadiness on feet  Muscle weakness (generalized)     Problem List Patient Active Problem List   Diagnosis Date Noted  . GERD (gastroesophageal reflux disease) 05/13/2018  . Depression, major, single episode, mild ([place]) 05/13/2018  . 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 Rotator cuff tear arthropathy, right 11/10/2017  . Decreased pedal pulses 11/01/2017  . Gait abnormality 10/21/2017  . Umbilical hernia without obstruction and without gangrene 09/13/2017  . Diabetes ([place]) 07/30/2017  . Hyperlipidemia 07/30/2017  . 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 Right shoulder pain 07/30/2017  . Chronic neck pain 07/30/2017  . Hypertension 07/30/2017   Janna Arch, PT, DPT   05/06/2020, 8:43 AM  [place] MAIN [facility] SERVICES [address] [place], [place], [address] Phone: [phone]   Fax:  [phone]  Name: Austin Richards MRN: [id number] Date of Birth: [birth year]

## 2020-05-08 ENCOUNTER — Other Ambulatory Visit: Payer: Self-pay

## 2020-05-08 ENCOUNTER — Ambulatory Visit: Payer: Medicare Other | Admitting: Physical Therapy

## 2020-05-08 ENCOUNTER — Encounter: Payer: Self-pay | Admitting: Physical Therapy

## 2020-05-08 DIAGNOSIS — R2681 Unsteadiness on feet: Secondary | ICD-10-CM | POA: Diagnosis not present

## 2020-05-08 DIAGNOSIS — R2689 Other abnormalities of gait and mobility: Secondary | ICD-10-CM

## 2020-05-08 DIAGNOSIS — M6281 Muscle weakness (generalized): Secondary | ICD-10-CM | POA: Diagnosis not present

## 2020-05-08 NOTE — Therapy (Signed)
[place] MAIN [facility] SERVICES [address] [place], [place], [address] Phone: [phone]   Fax:  [phone]  Physical Therapy Treatment  Patient Details  Name: Austin Richards MRN: [id number] Date of Birth: 1951-09-28 Referring Provider (PT): Dr. Caryl Bis, Angela Adam   Encounter Date: 05/08/2020   PT End of Session - 05/08/20 0811    Visit Number 6    Number of Visits 16    Date for PT Re-Evaluation 06/10/20    Authorization Type 5/10 eval 7/19    Authorization Time Period PT FOTO    PT Start Time 0805    PT Stop Time 0845    PT Time Calculation (min) 40 min    Equipment Utilized During Treatment Gait belt    Activity Tolerance Patient tolerated treatment well    Behavior During Therapy [facility] for tasks assessed/performed           Past Medical History:  Diagnosis Date  . Arthritis   . Ataxia   . 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 Hx of colonic polyp   . Hyperlipidemia   . Migraine   . Pre-diabetes     Past Surgical History:  Procedure Laterality Date  . CHOLECYSTECTOMY    . TOTAL SHOULDER ARTHROPLASTY Right 11/10/2017   Procedure: TOTAL SHOULDER ARTHROPLASTY;  Surgeon: Hiram Gash, MD;  Location: [place];  Service: Orthopedics;  Laterality: Right;    There were no vitals filed for this visit.   Subjective Assessment - 05/08/20 0810    Subjective Patient reports continued low back discomfort.    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 Patient has a PMH of arthritis, ataxia, CKD (one working kidney), colonic polyp, hyperlipidemia, migraine, pre-diabetes, total shoulder arthroplasty (2019). Patient presents with his sister who reports he is very active, walks from trailer to [employer], gardens, Social research officer, government. Patient lives alone but sister lives down the  road from him. Per patient report he has a hard time slowing down, falls frequently but is able to catch himself most of the time. At home he furniture surfs but recently got a rollator. Reports no dizziness. Symptoms began in 2015 and progressively worsened.    Limitations Lifting;Standing;Walking;House hold activities    How long can you sit comfortably? n/a    How long can you stand comfortably? a few minutes    How long can you walk comfortably? pain in hip limits him    Patient Stated Goals to get stronger and decrease falls    Currently in Pain? Yes    Pain Score 4     Pain Location Back    Pain Orientation Lower    Pain Descriptors / Indicators Aching    Pain Type Chronic pain    Pain Onset More than a month ago    Pain Frequency Intermittent    Multiple Pain Sites No                Nustep Lvl 2  x5 min (unbilled);  Standing next to support surface: 2lb ankle weight: -hip flexion/marching 15x each LE, SUE support  -hip abduction 15x each LE, SUE support  -hip extension 15x each LE, SUE support -Heel raise x15 reps each LE without UE support with CGA for safety;   NMR: Standing on airex: -alternate  toe taps to 6 inch step with 1-0 rail assist x15 reps each LE -airex pad 6" step modified tandem stance   Static hold 15 sec hold  Unsupported standing head turns side/side x10 reps each foot on step  Unsupported standing with ball pass side/side x5 reps each LE on step; -airex pad static stand tandem stance unsupported standing 30 sec hold, min A for safety;   Progressed to lateral head turns side/side x10 reps each foot in front with min A for safety;   -forward lunge onto bosu SUE support 10x each LE  Standing on BOSU: -unsupported standing x20 sec hold with min A for safety; -mini squat with 1 HHA x10 reps;   Patient requires mod/ max cueing for sequencing with frequent visual cue due to limited/absent hearing.  Patient tolerated session well. He  reports less back pain at end of session. He required min A with most balance exercise with difficulty finding neutral weight shift and balance while on compliant surface;                      PT Education - 05/08/20 0811    Education Details exercise technique, body mechanics;    Person(s) Educated Patient    Methods Explanation;Verbal cues    Comprehension Verbalized understanding;Returned demonstration;Verbal cues required;Need further instruction            PT Short Term Goals - 04/15/20 1315      PT SHORT TERM GOAL #1   Title Patient will be independent in home exercise program to improve strength/mobility for better functional independence with ADLs.    Baseline 7/19: HEP given    Time 2    Period Weeks    Status New    Target Date 04/29/20             PT Long Term Goals - 04/15/20 1316      PT LONG TERM GOAL #1   Title Patient will increase FOTO score to equal to or greater than  51%   to demonstrate statistically significant improvement in mobility and quality of life.    Baseline 7/19: 29%    Time 8    Period Weeks    Status New    Target Date 06/10/20      PT LONG TERM GOAL #2   Title Patient will demonstrate an improved Berg Balance Score of > 33/56  as to demonstrate improved balance with ADLs such as sitting/standing and transfer balance and reduced fall risk.    Baseline 7/19: 27/56    Time 8    Period Weeks    Status New    Target Date 06/10/20      PT LONG TERM GOAL #3   Title Patient will increase BLE gross strength to 4+/5 as to improve functional strength for independent gait, increased standing tolerance and increased ADL ability.    Baseline 7/19: see note    Time 8    Period Weeks    Status New    Target Date 06/10/20      PT LONG TERM GOAL #4   Title Patient will deny any falls over past 4 weeks to demonstrate improved safety awareness at home and in the community.    Baseline 7/19: falls every day    Time 8    Period  Weeks    Status New    Target Date 06/10/20  Plan - 05/08/20 1301    Clinical Impression Statement Patient motivated and participated well within session. He does have difficulty hearing and therefore requires increased demonstration/cues for proper exercise technique. Patient had increased difficulty with less UE support with all standing activities with increased lateral loss of balance. He required CGA to min A with most balance exercise. Patient would benefit from additional skilled PT intervention to improve strength, balance and mobility;    Personal Factors and Comorbidities Comorbidity 3+;Finances;Past/Current Experience;Time since onset of injury/illness/exacerbation;Transportation    Comorbidities arthritis, ataxia, CKD (one working kidney), colonic polyp, hyperlipidemia, migraine, pre-diabetes, total shoulder arthroplasty (2019)    Examination-Activity Limitations Bathing;Bend;Caring for Others;Carry;Dressing;Hygiene/Grooming;Stairs;Squat;Reach Overhead;Locomotion Level;Lift;Stand;Toileting;Transfers    Examination-Participation Restrictions Church;Cleaning;Community Activity;Driving;Laundry;Volunteer;Shop;Meal Prep;Yard Work    Merchant navy officer Evolving/Moderate complexity    Rehab Potential Fair    PT Frequency 2x / week    PT Duration 8 weeks    PT Treatment/Interventions ADLs/Self Care Home Management;Aquatic Therapy;Biofeedback;Cryotherapy;Canalith Repostioning;Electrical Stimulation;Iontophoresis 4mg /ml Dexamethasone;Moist Heat;Traction;Ultrasound;Therapeutic activities;Functional mobility training;Stair training;Gait training;DME Instruction;Therapeutic exercise;Balance training;Neuromuscular re-education;Patient/family education;Manual techniques;Passive range of motion;Dry needling;Energy conservation;Visual/perceptual remediation/compensation;Vestibular    PT Next Visit Plan ankle righting reactions, coordination, balance, strength    PT  Home Exercise Plan see above    Consulted and Agree with Plan of Care Patient;Family member/caregiver    Family Member Consulted sister           Patient will benefit from skilled therapeutic intervention in order to improve the following deficits and impairments:  Abnormal gait, Decreased activity tolerance, Decreased balance, Decreased knowledge of precautions, Decreased endurance, Decreased coordination, Decreased cognition, Decreased knowledge of use of DME, Decreased mobility, Decreased safety awareness, Difficulty walking, Decreased strength, Impaired perceived functional ability, Postural dysfunction, Improper body mechanics  Visit Diagnosis: Unsteadiness on feet  Muscle weakness (generalized)  Other abnormalities of gait and mobility     Problem List Patient Active Problem List   Diagnosis Date Noted  . GERD (gastroesophageal reflux disease) 05/13/2018  . Depression, major, single episode, mild ([place]) 05/13/2018  . 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 Rotator cuff tear arthropathy, right 11/10/2017  . Decreased pedal pulses 11/01/2017  . Gait abnormality 10/21/2017  . Umbilical hernia without obstruction and without gangrene 09/13/2017  . Diabetes ([place]) 07/30/2017  . Hyperlipidemia 07/30/2017  . 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 Right shoulder pain 07/30/2017  . Chronic neck pain 07/30/2017  . Hypertension 07/30/2017    Pammy Vesey PT, DPT 05/08/2020, 1:03 PM  [place] MAIN [facility] SERVICES [address] [place], [place], [address] Phone: [phone]   Fax:  [phone]  Name: Austin Richards MRN: [id number] Date of Birth: 1950/12/23

## 2020-05-13 ENCOUNTER — Ambulatory Visit: Payer: Medicare Other

## 2020-05-13 ENCOUNTER — Other Ambulatory Visit: Payer: Self-pay

## 2020-05-13 DIAGNOSIS — R2681 Unsteadiness on feet: Secondary | ICD-10-CM

## 2020-05-13 DIAGNOSIS — R2689 Other abnormalities of gait and mobility: Secondary | ICD-10-CM

## 2020-05-13 DIAGNOSIS — M6281 Muscle weakness (generalized): Secondary | ICD-10-CM

## 2020-05-13 NOTE — Therapy (Signed)
[place] MAIN [facility] SERVICES [address] [place], [place], [address] Phone: [phone]   Fax:  [phone]  Physical Therapy Treatment  Patient Details  Name: Austin Richards MRN: [id number] Date of Birth: 1950/10/24 Referring Provider (PT): Dr. Caryl Bis, Angela Adam   Encounter Date: 05/13/2020   PT End of Session - 05/13/20 0804    Visit Number 7    Number of Visits 16    Date for PT Re-Evaluation 06/10/20    Authorization Type 7/10 eval 7/19    Authorization Time Period PT FOTO    PT Start Time 0759    PT Stop Time 0844    PT Time Calculation (min) 45 min    Equipment Utilized During Treatment Gait belt    Activity Tolerance Patient tolerated treatment well    Behavior During Therapy [facility] for tasks assessed/performed           Past Medical History:  Diagnosis Date  . Arthritis   . Ataxia   . 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 Hx of colonic polyp   . Hyperlipidemia   . Migraine   . Pre-diabetes     Past Surgical History:  Procedure Laterality Date  . CHOLECYSTECTOMY    . TOTAL SHOULDER ARTHROPLASTY Right 11/10/2017   Procedure: TOTAL SHOULDER ARTHROPLASTY;  Surgeon: Hiram Gash, MD;  Location: [place];  Service: Orthopedics;  Laterality: Right;    There were no vitals filed for this visit.   Subjective Assessment - 05/13/20 0803    Subjective Patient reports no falls since last session. Has been compliant with HEP. Back pain has improved.    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 Patient has a PMH of arthritis, ataxia, CKD (one working kidney), colonic polyp, hyperlipidemia, migraine, pre-diabetes, total shoulder arthroplasty (2019). Patient presents with his sister who reports he is very active, walks from trailer to [employer], gardens,  Social research officer, government. Patient lives alone but sister lives down the road from him. Per patient report he has a hard time slowing down, falls frequently but is able to catch himself most of the time. At home he furniture surfs but recently got a rollator. Reports no dizziness. Symptoms began in 2015 and progressively worsened.    Limitations Lifting;Standing;Walking;House hold activities    How long can you sit comfortably? n/a    How long can you stand comfortably? a few minutes    How long can you walk comfortably? pain in hip limits him    Patient Stated Goals to get stronger and decrease falls    Pain Score 3     Pain Location Back    Pain Orientation Lower    Pain Descriptors / Indicators Aching    Pain Type Chronic pain    Pain Onset More than a month ago    Pain Frequency Intermittent              Nustep Lvl 5 RPM> 60  3 minutes for cardiovascular support.    Standing next to support surface: 5lb ankle weight: -hip flexion/marching 15x each LE, SUE support  -hip abduction 15x each LE, BUE support  -hip extension 15x each LE, BUE support -hamstring curl 15x each LE, BUE support   -Heel raise 10x bilaterally, 10x single LE at a time   -  airex pad 6" step modified tandem stance 2x 60 seconds each foot placement.  -airex pad 6 " step toe taps cues for decreasing -airex pad static stand 30 second holds x 2 trials -airex pad static stand with horizontal head turns 15x   In hallway: close CGA with occasional min A to retain COM.  Horizontal head turns 100 ft Vertical head turns 100 ft Mixture of horizotnal and vertical head turns 100 ft very challenging  Dual task horizontal head turn of naming garden ( signing) very challenging)  Dual task vertical head turn naming grocery list (signing) 100 ft very challenging   Seated: Green ball adduction 15x 3 second holds  green ball adduction between feet with LAQ 15x   Patient requires mod max cueing for sequencing with frequent visual cue due to  limited/absent hearing.                       PT Education - 05/13/20 0758    Education Details exercise technique, body mechanics    Person(s) Educated Patient    Methods Explanation;Demonstration;Tactile cues;Verbal cues    Comprehension Verbalized understanding;Returned demonstration;Verbal cues required;Tactile cues required            PT Short Term Goals - 04/15/20 1315      PT SHORT TERM GOAL #1   Title Patient will be independent in home exercise program to improve strength/mobility for better functional independence with ADLs.    Baseline 7/19: HEP given    Time 2    Period Weeks    Status New    Target Date 04/29/20             PT Long Term Goals - 04/15/20 1316      PT LONG TERM GOAL #1   Title Patient will increase FOTO score to equal to or greater than  51%   to demonstrate statistically significant improvement in mobility and quality of life.    Baseline 7/19: 29%    Time 8    Period Weeks    Status New    Target Date 06/10/20      PT LONG TERM GOAL #2   Title Patient will demonstrate an improved Berg Balance Score of > 33/56  as to demonstrate improved balance with ADLs such as sitting/standing and transfer balance and reduced fall risk.    Baseline 7/19: 27/56    Time 8    Period Weeks    Status New    Target Date 06/10/20      PT LONG TERM GOAL #3   Title Patient will increase BLE gross strength to 4+/5 as to improve functional strength for independent gait, increased standing tolerance and increased ADL ability.    Baseline 7/19: see note    Time 8    Period Weeks    Status New    Target Date 06/10/20      PT LONG TERM GOAL #4   Title Patient will deny any falls over past 4 weeks to demonstrate improved safety awareness at home and in the community.    Baseline 7/19: falls every day    Time 8    Period Weeks    Status New    Target Date 06/10/20                 Plan - 05/13/20 0953    Clinical Impression  Statement Patient is improving with capacity for stabilizing self on unstable surfaces. Dual task is challenging  with more frequent episodes of LOB as well as difficulty performing dual tasks to completion. Patient remains impulsive requiring cueing to watch and receive full instructions prior to starting to move.  Pt would benefit from skilled PT to improve strength, stability, and coordination for improved mobility and decreased fall risk.    Personal Factors and Comorbidities Comorbidity 3+;Finances;Past/Current Experience;Time since onset of injury/illness/exacerbation;Transportation    Comorbidities arthritis, ataxia, CKD (one working kidney), colonic polyp, hyperlipidemia, migraine, pre-diabetes, total shoulder arthroplasty (2019)    Examination-Activity Limitations Bathing;Bend;Caring for Others;Carry;Dressing;Hygiene/Grooming;Stairs;Squat;Reach Overhead;Locomotion Level;Lift;Stand;Toileting;Transfers    Examination-Participation Restrictions Church;Cleaning;Community Activity;Driving;Laundry;Volunteer;Shop;Meal Prep;Yard Work    Merchant navy officer Evolving/Moderate complexity    Rehab Potential Fair    PT Frequency 2x / week    PT Duration 8 weeks    PT Treatment/Interventions ADLs/Self Care Home Management;Aquatic Therapy;Biofeedback;Cryotherapy;Canalith Repostioning;Electrical Stimulation;Iontophoresis 4mg /ml Dexamethasone;Moist Heat;Traction;Ultrasound;Therapeutic activities;Functional mobility training;Stair training;Gait training;DME Instruction;Therapeutic exercise;Balance training;Neuromuscular re-education;Patient/family education;Manual techniques;Passive range of motion;Dry needling;Energy conservation;Visual/perceptual remediation/compensation;Vestibular    PT Next Visit Plan ankle righting reactions, coordination, balance, strength    PT Home Exercise Plan see above    Consulted and Agree with Plan of Care Patient;Family member/caregiver    Family Member Consulted  sister           Patient will benefit from skilled therapeutic intervention in order to improve the following deficits and impairments:  Abnormal gait, Decreased activity tolerance, Decreased balance, Decreased knowledge of precautions, Decreased endurance, Decreased coordination, Decreased cognition, Decreased knowledge of use of DME, Decreased mobility, Decreased safety awareness, Difficulty walking, Decreased strength, Impaired perceived functional ability, Postural dysfunction, Improper body mechanics  Visit Diagnosis: Unsteadiness on feet  Muscle weakness (generalized)  Other abnormalities of gait and mobility     Problem List Patient Active Problem List   Diagnosis Date Noted  . GERD (gastroesophageal reflux disease) 05/13/2018  . Depression, major, single episode, mild ([place]) 05/13/2018  . 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) 01/04/2018  . Rotator cuff tear arthropathy, right 11/10/2017  . Decreased pedal pulses 11/01/2017  . Gait abnormality 10/21/2017  . Umbilical hernia without obstruction and without gangrene 09/13/2017  . Diabetes ([place]) 07/30/2017  . Hyperlipidemia 07/30/2017  . 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 Right shoulder pain 07/30/2017  . Chronic neck pain 07/30/2017  . Hypertension 07/30/2017   Janna Arch, PT, DPT   05/13/2020, 9:55 AM  [place] MAIN [facility] SERVICES [address] [place], [place], [address] Phone: [phone]   Fax:  [phone]  Name: Gurjot Brisco Karg MRN: [id number] Date of Birth: Oct 19, 1950

## 2020-05-15 ENCOUNTER — Other Ambulatory Visit: Payer: Self-pay

## 2020-05-15 ENCOUNTER — Ambulatory Visit: Payer: Medicare Other

## 2020-05-15 DIAGNOSIS — R2689 Other abnormalities of gait and mobility: Secondary | ICD-10-CM

## 2020-05-15 DIAGNOSIS — R2681 Unsteadiness on feet: Secondary | ICD-10-CM

## 2020-05-15 DIAGNOSIS — M6281 Muscle weakness (generalized): Secondary | ICD-10-CM | POA: Diagnosis not present

## 2020-05-15 NOTE — Therapy (Signed)
[place] MAIN [facility] SERVICES [address] [place], [place], [address] Phone: [phone]   Fax:  [phone]  Physical Therapy Treatment  Patient Details  Name: Austin Richards MRN: [id number] Date of Birth: [birth year] Referring Provider (PT): Dr. 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   Subjective Assessment - 05/15/20 0811    Subjective Pt doing good today, no updates since last visit.    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 Patient has a PMH of arthritis, ataxia, CKD (one working kidney), colonic polyp, hyperlipidemia, migraine, pre-diabetes, total shoulder arthroplasty (2019). Patient presents with his sister who reports he is very active, walks from trailer to [employer], gardens, Social research officer, government. Patient lives alone but sister lives down the road from him. Per patient report he has a hard time slowing  down, falls frequently but is able to catch himself most of the time. At home he furniture surfs but recently got a rollator. Reports no dizziness. Symptoms began in 2015 and progressively worsened.    Currently in Pain? No/denies           INTERVENTION THIS DATE: -Nustep level 2, 4 minutes, seat 9, arms 10   Standing next to support surface: 5lb ankle weights bilat -hip flexion/marching 15x each LE, SUE support  -hip abduction 15x each LE, BUE support  -hip extension 15x each LE, BUE support -hamstring curl 15x each LE, BUE support -heel raises 1x20 synchronous  -standing forward cone taps 1x15 bilat, intermittent single UE support,minGuard assist  -lateral side stepping in // bars 1x54ft bilat, supervision level  -retrostrepping 1x35ft, minguardA in // bars  -narrow stance ball self toss/catch 1x10 bliue ball -narrow stance ball self toss/catch 1x15 on airex, blue ball  -foam stance rainbow rebound overdoorframe 1x15, minguard assist  -fwd stepups 1x10 bilat floor to foam, intermitten tminA  -lateral stepups 1x10 bilat floor to foam, intermittent minA      PT Short Term Goals - 04/15/20 1315      PT SHORT TERM GOAL #1   Title Patient will be independent in home exercise program to improve strength/mobility for better functional independence with ADLs.    Baseline 7/19: HEP given    Time  2    Period Weeks    Status New    Target Date 04/29/20             PT Long Term Goals - 04/15/20 1316      PT LONG TERM GOAL #1   Title Patient will increase FOTO score to equal to or greater than  51%   to demonstrate statistically significant improvement in mobility and quality of life.    Baseline 7/19: 29%    Time 8    Period Weeks    Status New    Target Date 06/10/20      PT LONG TERM GOAL #2   Title Patient will demonstrate an improved Berg Balance Score of > 33/56  as to demonstrate improved balance with ADLs such as sitting/standing and transfer balance and reduced  fall risk.    Baseline 7/19: 27/56    Time 8    Period Weeks    Status New    Target Date 06/10/20      PT LONG TERM GOAL #3   Title Patient will increase BLE gross strength to 4+/5 as to improve functional strength for independent gait, increased standing tolerance and increased ADL ability.    Baseline 7/19: see note    Time 8    Period Weeks    Status New    Target Date 06/10/20      PT LONG TERM GOAL #4   Title Patient will deny any falls over past 4 weeks to demonstrate improved safety awareness at home and in the community.    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 Continued with lower leg resistance training, then progressed to simple straight plane dynamic stability training. Pt able to complete entire session as planned with rest breaks provided as needed. Pt maintains high level of focus and motivation. Extensive verbal, visual, and tactile cues are provided for most accurate form possible. Author provides minA intermittently for full ROM when needed. Overall pt continues to make steady progress toward treatment goals.    Personal Factors and Comorbidities Comorbidity 3+;Finances;Past/Current Experience;Time since onset of injury/illness/exacerbation;Transportation    Comorbidities arthritis, ataxia, CKD (one working kidney), colonic polyp, hyperlipidemia, migraine, pre-diabetes, total shoulder arthroplasty (2019)    Examination-Activity Limitations Bathing;Bend;Caring for Others;Carry;Dressing;Hygiene/Grooming;Stairs;Squat;Reach Overhead;Locomotion Level;Lift;Stand;Toileting;Transfers    Examination-Participation Restrictions Church;Cleaning;Community Activity;Driving;Laundry;Volunteer;Shop;Meal Prep;Yard Work    Merchant navy officer Evolving/Moderate complexity    Clinical Decision  Making Moderate    Rehab Potential Fair    PT Frequency 2x / week    PT Duration 8 weeks    PT Treatment/Interventions ADLs/Self Care Home Management;Aquatic Therapy;Biofeedback;Cryotherapy;Canalith Repostioning;Electrical Stimulation;Iontophoresis 4mg /ml Dexamethasone;Moist Heat;Traction;Ultrasound;Therapeutic activities;Functional mobility training;Stair training;Gait training;DME Instruction;Therapeutic exercise;Balance training;Neuromuscular re-education;Patient/family education;Manual techniques;Passive range of motion;Dry needling;Energy conservation;Visual/perceptual remediation/compensation;Vestibular    PT Next Visit Plan ankle righting reactions, coordination, balance, strength    PT Home Exercise Plan no updates this session    Consulted and Agree with Plan of Care Patient           Patient will benefit from skilled therapeutic intervention in order to improve the following deficits and impairments:  Abnormal gait, Decreased activity tolerance, Decreased balance, Decreased knowledge of precautions, Decreased endurance, Decreased coordination, Decreased  cognition, Decreased knowledge of use of DME, Decreased mobility, Decreased safety awareness, Difficulty walking, Decreased strength, Impaired perceived functional ability, Postural dysfunction, Improper body mechanics  Visit Diagnosis: Unsteadiness on feet  Muscle weakness (generalized)  Other abnormalities of gait and mobility     Problem List Patient Active Problem List   Diagnosis Date Noted   GERD (gastroesophageal reflux disease) 05/13/2018   Depression, major, single episode, mild ([place]) 05/13/2018   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[place]) 01/04/2018   Rotator cuff tear arthropathy, right 11/10/2017   Decreased pedal pulses 11/01/2017   Gait abnormality 01/75/1025   Umbilical hernia without obstruction and without gangrene 09/13/2017   Diabetes ([place]) 07/30/2017   Hyperlipidemia 07/30/2017   Ataxia 07/30/2017   Right  shoulder pain 07/30/2017   Chronic neck pain 07/30/2017   Hypertension 07/30/2017   8:53 AM, 05/15/20 Etta Grandchild, PT, DPT Physical Therapist - [facility]  Outpatient Physical Therapy- [place] [phone]     [place] [facility] 05/15/2020, 8:18 AM  [place] MAIN [facility] SERVICES [address] [place], [place], [address] Phone: [phone]   Fax:  [phone]  Name: Austin Richards MRN: [id number] Date of Birth: 09/16/1951

## 2020-05-16 ENCOUNTER — Telehealth: Payer: Self-pay | Admitting: Family Medicine

## 2020-05-16 MED ORDER — BLOOD GLUCOSE MONITOR KIT: PACK | 0 refills | Status: DC

## 2020-05-16 NOTE — Telephone Encounter (Signed)
Printed. Please place in my basket to sign.

## 2020-05-16 NOTE — Addendum Note (Signed)
Addended by: Leone Haven on: 05/16/2020 03:43 PM   Modules accepted: Orders

## 2020-05-16 NOTE — Telephone Encounter (Signed)
Pt wanted to have a Glucometer called in

## 2020-05-17 MED ORDER — BLOOD GLUCOSE MONITOR KIT: PACK | 0 refills | Status: AC

## 2020-05-17 NOTE — Telephone Encounter (Signed)
I could not find the RX can you reprint and I will send it.  Lillybeth Tal,cma

## 2020-05-17 NOTE — Telephone Encounter (Signed)
Printed again..?

## 2020-05-17 NOTE — Addendum Note (Signed)
Addended by: Leone Haven on: 05/17/2020 03:43 PM   Modules accepted: Orders

## 2020-05-20 ENCOUNTER — Ambulatory Visit: Payer: Medicare Other

## 2020-05-20 ENCOUNTER — Other Ambulatory Visit: Payer: Self-pay

## 2020-05-20 DIAGNOSIS — M6281 Muscle weakness (generalized): Secondary | ICD-10-CM

## 2020-05-20 DIAGNOSIS — R2681 Unsteadiness on feet: Secondary | ICD-10-CM | POA: Diagnosis not present

## 2020-05-20 DIAGNOSIS — R2689 Other abnormalities of gait and mobility: Secondary | ICD-10-CM

## 2020-05-20 NOTE — Therapy (Signed)
[place] MAIN [facility] SERVICES [address] [place], [place], [address] Phone: [phone]   Fax:  [phone]  Physical Therapy Treatment  Patient Details  Name: Austin Richards MRN: [id number] Date of Birth: 21-Mar-1951 Referring Provider (PT): Dr. Caryl Bis, Angela Adam   Encounter Date: 05/20/2020   PT End of Session - 05/20/20 0804    Visit Number 9    Number of Visits 16    Date for PT Re-Evaluation 06/10/20    Authorization Type 9/10 eval 7/19    Authorization Time Period PT FOTO    PT Start Time 0759    PT Stop Time 0843    PT Time Calculation (min) 44 min    Equipment Utilized During Treatment Gait belt    Activity Tolerance Patient tolerated treatment well;No increased pain    Behavior During Therapy WFL for tasks assessed/performed           Past Medical History:  Diagnosis Date  . Arthritis   . Ataxia   . 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 Hx of colonic polyp   . Hyperlipidemia   . Migraine   . Pre-diabetes     Past Surgical History:  Procedure Laterality Date  . CHOLECYSTECTOMY    . TOTAL SHOULDER ARTHROPLASTY Right 11/10/2017   Procedure: TOTAL SHOULDER ARTHROPLASTY;  Surgeon: Hiram Gash, MD;  Location: [facility];  Service: Orthopedics;  Laterality: Right;    There were no vitals filed for this visit.   Subjective Assessment - 05/20/20 0803    Subjective Patient reports no falls over the weekend. Has been busy in the garden, has been caregul and purposeful with his ambulation.    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 Patient has a PMH of arthritis, ataxia, CKD (one working kidney), colonic polyp, hyperlipidemia, migraine, pre-diabetes, total shoulder arthroplasty (2019). Patient presents with his sister who reports he is very active, walks from trailer to [employer], gardens,  Social research officer, government. Patient lives alone but sister lives down the road from him. Per patient report he has a hard time slowing down, falls frequently but is able to catch himself most of the time. At home he furniture surfs but recently got a rollator. Reports no dizziness. Symptoms began in 2015 and progressively worsened.    Currently in Pain? No/denies            Nustep Lvl 5 RPM> 60  3 minutes for cardiovascular support.    Standing next to support surface: 5lb ankle weight: -hip flexion/marching 15x each LE, SUE support ; 2 sets -hip abduction 15x each LE, BUE support ; -hip extension 15x each LE, BUE support; 2 sets -hamstring curl 15x each LE, BUE support   -Heel raise 10x bilaterally, 10x single LE at a time   -grapevine with finger tip support to no UE support 10x length of // bars  -airex pad dynadisc modified tandem stance 2x 60 seconds each foot placement.  -airex pad 5lb bar: chest press 12x, overhead press 12x, bicep curl 10x  -airex balance beam; side stepping without UE support, cues for slight knee flexion and smaller step length 6x length of // bars -airex balance beam : tandem step with finger tip support 4x length of // bar      Seated: Green ball adduction 15x 3 second holds  green ball adduction between  feet with LAQ 15x   Patient requires mod max cueing for sequencing with frequent visual cue due to limited/absent hearing.                             PT Education - 05/20/20 0803    Education Details exercise technique, body mechanics    Person(s) Educated Patient    Methods Explanation;Demonstration;Tactile cues;Verbal cues    Comprehension Verbalized understanding;Returned demonstration;Verbal cues required;Tactile cues required            PT Short Term Goals - 04/15/20 1315      PT SHORT TERM GOAL #1   Title Patient will be independent in home exercise program to improve strength/mobility for better functional independence with ADLs.     Baseline 7/19: HEP given    Time 2    Period Weeks    Status New    Target Date 04/29/20             PT Long Term Goals - 04/15/20 1316      PT LONG TERM GOAL #1   Title Patient will increase FOTO score to equal to or greater than  51%   to demonstrate statistically significant improvement in mobility and quality of life.    Baseline 7/19: 29%    Time 8    Period Weeks    Status New    Target Date 06/10/20      PT LONG TERM GOAL #2   Title Patient will demonstrate an improved Berg Balance Score of > 33/56  as to demonstrate improved balance with ADLs such as sitting/standing and transfer balance and reduced fall risk.    Baseline 7/19: 27/56    Time 8    Period Weeks    Status New    Target Date 06/10/20      PT LONG TERM GOAL #3   Title Patient will increase BLE gross strength to 4+/5 as to improve functional strength for independent gait, increased standing tolerance and increased ADL ability.    Baseline 7/19: see note    Time 8    Period Weeks    Status New    Target Date 06/10/20      PT LONG TERM GOAL #4   Title Patient will deny any falls over past 4 weeks to demonstrate improved safety awareness at home and in the community.    Baseline 7/19: falls every day    Time 8    Period Weeks    Status New    Target Date 06/10/20                 Plan - 05/20/20 0836    Clinical Impression Statement Patient progressing with unstable surfaces with increased addition of weight and dual task with decreased episodes of LOB. Fingertip support required for tandem movements and crossing of midline. Increased focus on spatial awareness and coordination with strengthening interventions performed and tolerated. Pt would benefit from skilled PT to improve strength, stability, and coordination for improved mobility and decreased fall risk.    Personal Factors and Comorbidities Comorbidity 3+;Finances;Past/Current Experience;Time since onset of  injury/illness/exacerbation;Transportation    Comorbidities arthritis, ataxia, CKD (one working kidney), colonic polyp, hyperlipidemia, migraine, pre-diabetes, total shoulder arthroplasty (2019)    Examination-Activity Limitations Bathing;Bend;Caring for Others;Carry;Dressing;Hygiene/Grooming;Stairs;Squat;Reach Overhead;Locomotion Level;Lift;Stand;Toileting;Transfers    Examination-Participation Restrictions Church;Cleaning;Community Activity;Driving;Laundry;Volunteer;Shop;Meal Prep;Yard Work    Producer, television/film/video  PT Frequency 2x / week    PT Duration 8 weeks    PT Treatment/Interventions ADLs/Self Care Home Management;Aquatic Therapy;Biofeedback;Cryotherapy;Canalith Repostioning;Electrical Stimulation;Iontophoresis 4mg /ml Dexamethasone;Moist Heat;Traction;Ultrasound;Therapeutic activities;Functional mobility training;Stair training;Gait training;DME Instruction;Therapeutic exercise;Balance training;Neuromuscular re-education;Patient/family education;Manual techniques;Passive range of motion;Dry needling;Energy conservation;Visual/perceptual remediation/compensation;Vestibular    PT Next Visit Plan ankle righting reactions, coordination, balance, strength    PT Home Exercise Plan no updates this session    Consulted and Agree with Plan of Care Patient           Patient will benefit from skilled therapeutic intervention in order to improve the following deficits and impairments:  Abnormal gait, Decreased activity tolerance, Decreased balance, Decreased knowledge of precautions, Decreased endurance, Decreased coordination, Decreased cognition, Decreased knowledge of use of DME, Decreased mobility, Decreased safety awareness, Difficulty walking, Decreased strength, Impaired perceived functional ability, Postural dysfunction, Improper body mechanics  Visit Diagnosis: Unsteadiness on feet  Muscle weakness (generalized)  Other  abnormalities of gait and mobility     Problem List Patient Active Problem List   Diagnosis Date Noted  . GERD (gastroesophageal reflux disease) 05/13/2018  . Depression, major, single episode, mild ([place]) 05/13/2018  . 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 Rotator cuff tear arthropathy, right 11/10/2017  . Decreased pedal pulses 11/01/2017  . Gait abnormality 10/21/2017  . Umbilical hernia without obstruction and without gangrene 09/13/2017  . Diabetes ([place]) 07/30/2017  . Hyperlipidemia 07/30/2017  . 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 Right shoulder pain 07/30/2017  . Chronic neck pain 07/30/2017  . Hypertension 07/30/2017   Janna Arch, PT, DPT   05/20/2020, 8:44 AM  [place] MAIN [facility] SERVICES [address] [place], [place], [address] Phone: [phone]   Fax:  [phone]  Name: Austin Richards MRN: [id number] Date of Birth: May 13, 1951

## 2020-05-22 ENCOUNTER — Ambulatory Visit: Payer: Medicare Other

## 2020-05-22 ENCOUNTER — Other Ambulatory Visit: Payer: Self-pay

## 2020-05-22 DIAGNOSIS — R2689 Other abnormalities of gait and mobility: Secondary | ICD-10-CM | POA: Diagnosis not present

## 2020-05-22 DIAGNOSIS — M6281 Muscle weakness (generalized): Secondary | ICD-10-CM | POA: Diagnosis not present

## 2020-05-22 DIAGNOSIS — R2681 Unsteadiness on feet: Secondary | ICD-10-CM

## 2020-05-22 NOTE — Therapy (Signed)
[place] MAIN [facility] SERVICES [address] [place], [place], [address] Phone: [phone]   Fax:  [phone]  Physical Therapy Treatment Physical Therapy Progress Note/RECERT   Dates of reporting period  04/15/20  to   05/22/20  Patient Details  Name: Austin Richards MRN: [id number] Date of Birth: May 26, 1951 Referring Provider (PT): Dr. Leone Haven   Encounter Date: 05/22/2020   PT End of Session - 05/22/20 0803    Visit Number 10    Number of Visits 26    Date for PT Re-Evaluation 07/17/20    Authorization Type 10/10 eval 7/19; next session 1/10 PN 05/22/20    Authorization Time Period PT FOTO    PT Start Time 0759    PT Stop Time 0844    PT Time Calculation (min) 45 min    Equipment Utilized During Treatment Gait belt    Activity Tolerance Patient tolerated treatment well;No increased pain    Behavior During Therapy WFL for tasks assessed/performed           Past Medical History:  Diagnosis Date  . Arthritis   . Ataxia   . 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 Hx of colonic polyp   . Hyperlipidemia   . Migraine   . Pre-diabetes     Past Surgical History:  Procedure Laterality Date  . CHOLECYSTECTOMY    . TOTAL SHOULDER ARTHROPLASTY Right 11/10/2017   Procedure: TOTAL SHOULDER ARTHROPLASTY;  Surgeon: Hiram Gash, MD;  Location: [place];  Service: Orthopedics;  Laterality: Right;    There were no vitals filed for this visit.   Subjective Assessment - 05/22/20 0802    Subjective Patient is eager to do his goals today. No falls or LOB since last session. Has been compliant with HEP.    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 Patient has a PMH of arthritis, ataxia, CKD (one working kidney), colonic polyp, hyperlipidemia, migraine, pre-diabetes, total shoulder arthroplasty (2019).  Patient presents with his sister who reports he is very active, walks from trailer to [employer], gardens, Social research officer, government. Patient lives alone but sister lives down the road from him. Per patient report he has a hard time slowing down, falls frequently but is able to catch himself most of the time. At home he furniture surfs but recently got a rollator. Reports no dizziness. Symptoms began in 2015 and progressively worsened.    Currently in Pain? No/denies              [facility] PT Assessment - 05/22/20 0001      Standardized Balance Assessment   Standardized Balance Assessment Berg Balance Test;Dynamic Gait Index      Berg Balance Test   Sit to Stand Able to stand without using hands and stabilize independently    Standing Unsupported Able to stand safely 2 minutes    Sitting with Back Unsupported but Feet Supported on Floor or Stool Able to sit safely and securely 2 minutes    Stand to Sit Sits safely with minimal use of hands    Transfers Able to transfer safely, definite need of hands    Standing Unsupported with Eyes Closed Able to stand 3 seconds    Standing Unsupported with Feet Together Able to place feet together independently and stand for 1 minute with supervision    From Standing, Reach  Forward with Outstretched Arm Can reach forward >12 cm safely (5")    From Standing Position, Pick up Object from [place] to pick up shoe, needs supervision    From Standing Position, Turn to Look Behind Over each Shoulder Looks behind one side only/other side shows less weight shift    Turn 360 Degrees Able to turn 360 degrees safely one side only in 4 seconds or less    Standing Unsupported, Alternately Place Feet on Step/Stool Able to complete 4 steps without aid or supervision    Standing Unsupported, One Foot in Front Able to take small step independently and hold 30 seconds    Standing on One Leg Able to lift leg independently and hold equal to or more than 3 seconds    Total Score 42      Dynamic  Gait Index   Level Surface Mild Impairment    Change in Gait Speed Moderate Impairment    Gait with Horizontal Head Turns Severe Impairment    Gait with Vertical Head Turns Severe Impairment    Gait and Pivot Turn Moderate Impairment    Step Over Obstacle Mild Impairment    Step Around Obstacles Mild Impairment    Steps Mild Impairment    Total Score 10            Progress note FOTO: 62.8 %  BERG: 42/56  BLE strength: R knee flexion, df/pf 4-/5 ; L pf 4/5; rest 4+/5  Falls: no falls or stumbles since last session   New Goals:  DGI: 10/24  6 minute test : 1125 with 4 near falls requiring mod A to return to COM, no AD ; R hip pain     Nustep Lvl 5 RPM> 60  3 minutes for cardiovascular support.    Access Code: [email or web address] URL: [email or web address] Date: 05/22/2020 Prepared by: Janna Arch  Exercises Seated Piriformis Stretch with Trunk Bend - 1 x daily - 7 x weekly - 2 sets - 2 reps - 30 hold Standing Tandem Balance with Counter Support - 1 x daily - 7 x weekly - 2 sets - 2 reps - 30 hold Standing Single Leg Stance with Unilateral Counter Support - 1 x daily - 7 x weekly - 2 sets - 2 reps - 30 hold Wide Stance with Head Rotations and Counter Support - 1 x daily - 7 x weekly - 2 sets - 10 reps - 5 hold Wide Stance with Head Nods and Counter Support - 1 x daily - 7 x weekly - 2 sets - 10 reps - 5 hold    Patient requires mod max cueing for sequencing with frequent visual cue due to limited/absent hearing.     Patient's condition has the potential to improve in response to therapy. Maximum improvement is yet to be obtained. The anticipated improvement is attainable and reasonable in a generally predictable time.  Patient reports he is better at being aware of his movements, decreasing his falls risk. Feels like he is at 75%                     PT Education - 05/22/20 0802    Education Details goals, exercise technique    Person(s) Educated  Patient    Methods Explanation;Demonstration;Tactile cues;Verbal cues    Comprehension Verbalized understanding;Returned demonstration;Verbal cues required;Tactile cues required            PT Short Term Goals - 05/22/20 0818      PT SHORT TERM  GOAL #1   Title Patient will be independent in home exercise program to improve strength/mobility for better functional independence with ADLs.    Baseline 7/19: HEP given 8/25: HEP compliant    Time 2    Period Weeks    Status Achieved    Target Date 04/29/20             PT Long Term Goals - 05/22/20 0818      PT LONG TERM GOAL #1   Title Patient will increase FOTO score to equal to or greater than  51%   to demonstrate statistically significant improvement in mobility and quality of life.    Baseline 7/19: 29% 8/25: 62.8%    Time 8    Period Weeks    Status Achieved      PT LONG TERM GOAL #2   Title Patient will demonstrate an improved Berg Balance Score of > 33/56  as to demonstrate improved balance with ADLs such as sitting/standing and transfer balance and reduced fall risk.    Baseline 7/19: 27/56 8/25: 42/56    Time 8    Period Weeks    Status Achieved      PT LONG TERM GOAL #3   Title Patient will increase BLE gross strength to 4+/5 as to improve functional strength for independent gait, increased standing tolerance and increased ADL ability.    Baseline 7/19: see note 8/25: R knee flexion, df/pf 4-/5 ; L pf 4/5; rest 4+/5    Time 8    Period Weeks    Status Partially Met    Target Date 07/17/20      PT LONG TERM GOAL #4   Title Patient will deny any falls over past 4 weeks to demonstrate improved safety awareness at home and in the community.    Baseline 7/19: falls every day /25: no falls in past month    Time 8    Period Weeks    Status Achieved      PT LONG TERM GOAL #5   Title Patient will increase dynamic gait index score to >19/24 as to demonstrate reduced fall risk and improved dynamic gait balance for  better safety with community/home ambulation.    Baseline 8/25: 10/24    Time 8    Period Weeks    Status New    Target Date 07/17/20      Additional Long Term Goals   Additional Long Term Goals Yes      PT LONG TERM GOAL #6   Title Patient will increase six minute walk test distance to >1500 for progression towards age norm community ambulator and improve gait ability with decreased episodes of LOB.    Baseline 8/25: 1125 with 4 near falls requiring mod A to return to [facility],    Time 8    Period Weeks    Status New    Target Date 07/17/20                 Plan - 05/22/20 8676    Clinical Impression Statement Patient is progressing towards functional goals, meeting goals in regards to falls, balance, and FOTO. New goals that are appropriate for his progression added including the DGI and 6 minute walk test. Recert performed early due to change is status and POC.  Patient's condition has the potential to improve in response to therapy. Maximum improvement is yet to be obtained. The anticipated improvement is attainable and reasonable in a generally predictable time. Pt would benefit  from skilled PT to improve strength, stability, and coordination for improved mobility and decreased fall risk.    Personal Factors and Comorbidities Comorbidity 3+;Finances;Past/Current Experience;Time since onset of injury/illness/exacerbation;Transportation    Comorbidities arthritis, ataxia, CKD (one working kidney), colonic polyp, hyperlipidemia, migraine, pre-diabetes, total shoulder arthroplasty (2019)    Examination-Activity Limitations Bathing;Bend;Caring for Others;Carry;Dressing;Hygiene/Grooming;Stairs;Squat;Reach Overhead;Locomotion Level;Lift;Stand;Toileting;Transfers    Examination-Participation Restrictions Church;Cleaning;Community Activity;Driving;Laundry;Volunteer;Shop;Meal Prep;Yard Work    Merchant navy officer Evolving/Moderate complexity    Rehab Potential Fair    PT  Frequency 2x / week    PT Duration 8 weeks    PT Treatment/Interventions ADLs/Self Care Home Management;Aquatic Therapy;Biofeedback;Cryotherapy;Canalith Repostioning;Electrical Stimulation;Iontophoresis 79m/ml Dexamethasone;Moist Heat;Traction;Ultrasound;Therapeutic activities;Functional mobility training;Stair training;Gait training;DME Instruction;Therapeutic exercise;Balance training;Neuromuscular re-education;Patient/family education;Manual techniques;Passive range of motion;Dry needling;Energy conservation;Visual/perceptual remediation/compensation;Vestibular    PT Next Visit Plan ankle righting reactions, coordination, balance, strength    PT Home Exercise Plan no updates this session    Consulted and Agree with Plan of Care Patient           Patient will benefit from skilled therapeutic intervention in order to improve the following deficits and impairments:  Abnormal gait, Decreased activity tolerance, Decreased balance, Decreased knowledge of precautions, Decreased endurance, Decreased coordination, Decreased cognition, Decreased knowledge of use of DME, Decreased mobility, Decreased safety awareness, Difficulty walking, Decreased strength, Impaired perceived functional ability, Postural dysfunction, Improper body mechanics  Visit Diagnosis: Unsteadiness on feet  Muscle weakness (generalized)  Other abnormalities of gait and mobility     Problem List Patient Active Problem List   Diagnosis Date Noted  . GERD (gastroesophageal reflux disease) 05/13/2018  . Depression, major, single episode, mild ([facility] 05/13/2018  . 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[facility] 01/04/2018  . Rotator cuff tear arthropathy, right 11/10/2017  . Decreased pedal pulses 11/01/2017  . Gait abnormality 10/21/2017  . Umbilical hernia without obstruction and without gangrene 09/13/2017  . Diabetes ([facility] 07/30/2017  . Hyperlipidemia 07/30/2017  . 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 Right shoulder pain 07/30/2017  . Chronic neck pain  07/30/2017  . Hypertension 07/30/2017   MJanna Arch PT, DPT   05/22/2020, 9:54 AM  [place] [facility] [address] [address]Phone: [phone]  Fax:  [phone] Name: Austin McconathyRegister MRN: [id number] of Birth: 1Feb 09, 1952

## 2020-05-24 ENCOUNTER — Ambulatory Visit: Payer: Medicare Other | Admitting: Family Medicine

## 2020-05-27 ENCOUNTER — Ambulatory Visit: Payer: Medicare Other

## 2020-05-27 ENCOUNTER — Other Ambulatory Visit: Payer: Self-pay

## 2020-05-27 DIAGNOSIS — M6281 Muscle weakness (generalized): Secondary | ICD-10-CM

## 2020-05-27 DIAGNOSIS — R2681 Unsteadiness on feet: Secondary | ICD-10-CM | POA: Diagnosis not present

## 2020-05-27 DIAGNOSIS — R2689 Other abnormalities of gait and mobility: Secondary | ICD-10-CM

## 2020-05-27 NOTE — Therapy (Signed)
[place] MAIN [facility] SERVICES [address] [place], [place], [address] Phone: [phone]   Fax:  [phone]  Physical Therapy Treatment  Patient Details  Name: Austin Richards MRN: [id number] Date of Birth: 10/07/1950 Referring Provider (PT): Dr. Caryl Bis, Angela Adam   Encounter Date: 05/27/2020   PT End of Session - 05/27/20 0804    Visit Number 11    Number of Visits 26    Date for PT Re-Evaluation 07/17/20    Authorization Type 1/10 PN 05/22/20    Authorization Time Period PT FOTO    PT Start Time 0759    PT Stop Time 0844    PT Time Calculation (min) 45 min    Equipment Utilized During Treatment Gait belt    Activity Tolerance Patient tolerated treatment well;No increased pain    Behavior During Therapy WFL for tasks assessed/performed           Past Medical History:  Diagnosis Date  . Arthritis   . Ataxia   . 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 Hx of colonic polyp   . Hyperlipidemia   . Migraine   . Pre-diabetes     Past Surgical History:  Procedure Laterality Date  . CHOLECYSTECTOMY    . TOTAL SHOULDER ARTHROPLASTY Right 11/10/2017   Procedure: TOTAL SHOULDER ARTHROPLASTY;  Surgeon: Hiram Gash, MD;  Location: [place];  Service: Orthopedics;  Laterality: Right;    There were no vitals filed for this visit.   Subjective Assessment - 05/27/20 0802    Subjective Patient reports he was painting over the weekend, was steady and didn't have any falls, had to be careful not to look down too much. Had some occasional L knee pain that resolved with hamstring stretch.    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 Patient has a PMH of arthritis, ataxia, CKD (one working kidney), colonic polyp, hyperlipidemia, migraine, pre-diabetes, total shoulder arthroplasty (2019). Patient presents with his  sister who reports he is very active, walks from trailer to [employer], gardens, Social research officer, government. Patient lives alone but sister lives down the road from him. Per patient report he has a hard time slowing down, falls frequently but is able to catch himself most of the time. At home he furniture surfs but recently got a rollator. Reports no dizziness. Symptoms began in 2015 and progressively worsened.    Limitations Lifting;Standing;Walking;House hold activities    How long can you sit comfortably? n/a    How long can you stand comfortably? a few minutes    How long can you walk comfortably? pain in hip limits him    Patient Stated Goals to get stronger and decrease falls    Currently in Pain? No/denies             Nustep Lvl 5 RPM> 60  3 minutes for cardiovascular support.    Standing in // bars: 5lb ankle weight: -hip flexion/marching 15x each LE, SUE support ;  -hip abduction 15x each LE, BUE support ; -hip extension 15x each LE, BUE support;   -Heel raise 10x bilaterally, 10x single LE at a time  6" step up/down no UE support; 4 " step up/down no UE support consecutive, 6x length of // bars. ; occasional posterior LOB  6" eccentric heel taps finger tip support 12x each LE  airex pad tandem stance shoot ball at target x 15 each LE placement  airex balance beam; side stepping without UE support, cues for slight knee flexion and smaller step length 8x length of // bars; occasional LOB posterior  -airex balance beam : tandem step with finger tip support 4x length of // bar       Seated: 5lb ankle weight: 10x 5 second hold LAQ each LE, march 15x each LE  Opposite UE/LE raises 10x  Patient requires mod max cueing for sequencing with frequent visual cue due to limited/absent hearing.                            PT Education - 05/27/20 0804    Education Details exercise technique, body mechanics    Person(s) Educated Patient    Methods  Explanation;Demonstration;Tactile cues;Verbal cues    Comprehension Verbalized understanding;Returned demonstration;Verbal cues required;Tactile cues required            PT Short Term Goals - 05/22/20 0818      PT SHORT TERM GOAL #1   Title Patient will be independent in home exercise program to improve strength/mobility for better functional independence with ADLs.    Baseline 7/19: HEP given 8/25: HEP compliant    Time 2    Period Weeks    Status Achieved    Target Date 04/29/20             PT Long Term Goals - 05/22/20 0818      PT LONG TERM GOAL #1   Title Patient will increase FOTO score to equal to or greater than  51%   to demonstrate statistically significant improvement in mobility and quality of life.    Baseline 7/19: 29% 8/25: 62.8%    Time 8    Period Weeks    Status Achieved      PT LONG TERM GOAL #2   Title Patient will demonstrate an improved Berg Balance Score of > 33/56  as to demonstrate improved balance with ADLs such as sitting/standing and transfer balance and reduced fall risk.    Baseline 7/19: 27/56 8/25: 42/56    Time 8    Period Weeks    Status Achieved      PT LONG TERM GOAL #3   Title Patient will increase BLE gross strength to 4+/5 as to improve functional strength for independent gait, increased standing tolerance and increased ADL ability.    Baseline 7/19: see note 8/25: R knee flexion, df/pf 4-/5 ; L pf 4/5; rest 4+/5    Time 8    Period Weeks    Status Partially Met    Target Date 07/17/20      PT LONG TERM GOAL #4   Title Patient will deny any falls over past 4 weeks to demonstrate improved safety awareness at home and in the community.    Baseline 7/19: falls every day /25: no falls in past month    Time 8    Period Weeks    Status Achieved      PT LONG TERM GOAL #5   Title Patient will increase dynamic gait index score to >19/24 as to demonstrate reduced fall risk and improved dynamic gait balance for better safety with  community/home ambulation.    Baseline 8/25: 10/24    Time 8    Period Weeks    Status New    Target Date 07/17/20      Additional  Long Term Goals   Additional Long Term Goals Yes      PT LONG TERM GOAL #6   Title Patient will increase six minute walk test distance to >1500 for progression towards age norm community ambulator and improve gait ability with decreased episodes of LOB.    Baseline 8/25: 1125 with 4 near falls requiring mod A to return to [facility],    Time 8    Period Weeks    Status New    Target Date 07/17/20                 Plan - 05/27/20 6144    Clinical Impression Statement Patient is challenged with step negotiation without UE support with frequent posterior trunk lean resulting in near LOB. Patient requires frequent cueing for task orientation due to tangential and verbose nature. Frequent posterior LOB decreased with cues for 45 degree angle. Pt would benefit from skilled PT to improve strength, stability, and coordination for improved mobility and decreased fall risk.    Personal Factors and Comorbidities Comorbidity 3+;Finances;Past/Current Experience;Time since onset of injury/illness/exacerbation;Transportation    Comorbidities arthritis, ataxia, CKD (one working kidney), colonic polyp, hyperlipidemia, migraine, pre-diabetes, total shoulder arthroplasty (2019)    Examination-Activity Limitations Bathing;Bend;Caring for Others;Carry;Dressing;Hygiene/Grooming;Stairs;Squat;Reach Overhead;Locomotion Level;Lift;Stand;Toileting;Transfers    Examination-Participation Restrictions Church;Cleaning;Community Activity;Driving;Laundry;Volunteer;Shop;Meal Prep;Yard Work    Merchant navy officer Evolving/Moderate complexity    Rehab Potential Fair    PT Frequency 2x / week    PT Duration 8 weeks    PT Treatment/Interventions ADLs/Self Care Home Management;Aquatic Therapy;Biofeedback;Cryotherapy;Canalith Repostioning;Electrical Stimulation;Iontophoresis 3m/ml  Dexamethasone;Moist Heat;Traction;Ultrasound;Therapeutic activities;Functional mobility training;Stair training;Gait training;DME Instruction;Therapeutic exercise;Balance training;Neuromuscular re-education;Patient/family education;Manual techniques;Passive range of motion;Dry needling;Energy conservation;Visual/perceptual remediation/compensation;Vestibular    PT Next Visit Plan ankle righting reactions, coordination, balance, strength    PT Home Exercise Plan no updates this session    Consulted and Agree with Plan of Care Patient           Patient will benefit from skilled therapeutic intervention in order to improve the following deficits and impairments:  Abnormal gait, Decreased activity tolerance, Decreased balance, Decreased knowledge of precautions, Decreased endurance, Decreased coordination, Decreased cognition, Decreased knowledge of use of DME, Decreased mobility, Decreased safety awareness, Difficulty walking, Decreased strength, Impaired perceived functional ability, Postural dysfunction, Improper body mechanics  Visit Diagnosis: Unsteadiness on feet  Muscle weakness (generalized)  Other abnormalities of gait and mobility     Problem List Patient Active Problem List   Diagnosis Date Noted  . GERD (gastroesophageal reflux disease) 05/13/2018  . Depression, major, single episode, mild ([place] 05/13/2018  . 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[place] 01/04/2018  . Rotator cuff tear arthropathy, right 11/10/2017  . Decreased pedal pulses 11/01/2017  . Gait abnormality 10/21/2017  . Umbilical hernia without obstruction and without gangrene 09/13/2017  . Diabetes ([facility] 07/30/2017  . Hyperlipidemia 07/30/2017  . 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 Right shoulder pain 07/30/2017  . Chronic neck pain 07/30/2017  . Hypertension 07/30/2017   MJanna Arch PT, DPT    05/27/2020, 8:46 AM  [place] [facility] [address] [address]Phone:  [phone]  Fax:  [phone] Name: Austin Richards MRN: [id number] of Birth: [birth year]

## 2020-05-29 ENCOUNTER — Ambulatory Visit: Payer: Medicare Other | Attending: Family Medicine

## 2020-05-29 ENCOUNTER — Other Ambulatory Visit: Payer: Self-pay

## 2020-05-29 DIAGNOSIS — R2689 Other abnormalities of gait and mobility: Secondary | ICD-10-CM | POA: Insufficient documentation

## 2020-05-29 DIAGNOSIS — R2681 Unsteadiness on feet: Secondary | ICD-10-CM | POA: Diagnosis not present

## 2020-05-29 DIAGNOSIS — M6281 Muscle weakness (generalized): Secondary | ICD-10-CM | POA: Diagnosis not present

## 2020-05-29 NOTE — Therapy (Signed)
[place] MAIN [facility] SERVICES [address] [place], [place], [address] Phone: [phone]   Fax:  [phone]  Physical Therapy Treatment  Patient Details  Name: Austin Richards MRN: [id number] Date of Birth: 29-Nov-1950 Referring Provider (PT): Dr. Caryl Bis, Angela Adam   Encounter Date: 05/29/2020   PT End of Session - 05/29/20 0805    Visit Number 12    Number of Visits 26    Date for PT Re-Evaluation 07/17/20    Authorization Type 2/10 PN 05/22/20    Authorization Time Period PT FOTO    PT Start Time 0759    PT Stop Time 0844    PT Time Calculation (min) 45 min    Equipment Utilized During Treatment Gait belt    Activity Tolerance Patient tolerated treatment well;No increased pain    Behavior During Therapy WFL for tasks assessed/performed           Past Medical History:  Diagnosis Date  . Arthritis   . Ataxia   . 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 Hx of colonic polyp   . Hyperlipidemia   . Migraine   . Pre-diabetes     Past Surgical History:  Procedure Laterality Date  . CHOLECYSTECTOMY    . TOTAL SHOULDER ARTHROPLASTY Right 11/10/2017   Procedure: TOTAL SHOULDER ARTHROPLASTY;  Surgeon: Hiram Gash, MD;  Location: [place];  Service: Orthopedics;  Laterality: Right;    There were no vitals filed for this visit.   Subjective Assessment - 05/29/20 0804    Subjective Patient reports no falls or LOB since last session. 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 Patient has a PMH of arthritis, ataxia, CKD (one working kidney), colonic polyp, hyperlipidemia, migraine, pre-diabetes, total shoulder arthroplasty (2019). Patient presents with his sister who reports he is very active, walks from trailer to [employer], gardens, Social research officer, government. Patient  lives alone but sister lives down the road from him. Per patient report he has a hard time slowing down, falls frequently but is able to catch himself most of the time. At home he furniture surfs but recently got a rollator. Reports no dizziness. Symptoms began in 2015 and progressively worsened.    Limitations Lifting;Standing;Walking;House hold activities    How long can you sit comfortably? n/a    How long can you stand comfortably? a few minutes    How long can you walk comfortably? pain in hip limits him    Patient Stated Goals to get stronger and decrease falls    Currently in Pain? No/denies           Drakkar  Nustep Lvl 5 RPM> 60  3 minutes for cardiovascular support.    Matrix resisted walking 12.5 lb, 2x each direction, forward, backwards, lateral L, lateral R ; close CGA and occasional instability  Standing in // bars: RTB around ankles: side stepping in // bars; no UE support 4x length of // bars  RTB monster walks forward/backwards 4x length of // bars, cues for squat position    Tilt board: anterior/posterior 15x each foot position, 30x total; lateral R/L 15x each side   airex pad tandem stance weighted ball (2000 gr) : chest press 10x, straight arm raise 10x, each foot position ; 20 x total each  exercise   grapevine no UE support; max cueing for sequencing due to cross body coordination x 6 lengths of / bars   Dynamic balloon taps reaching outside BOS without LOB x 3 minutes  Seated: Weighted ball (2000 gr) sit to stand overhead raises 10x  Opposite UE/LE raises 10x   Patient requires mod max cueing for sequencing with frequent visual cue due to limited/absent hearing.                           PT Education - 05/29/20 0805    Education Details exercise technique, body mechanics    Person(s) Educated Patient    Methods Explanation;Demonstration;Tactile cues;Verbal cues    Comprehension Verbalized understanding;Returned demonstration;Verbal  cues required;Tactile cues required            PT Short Term Goals - 05/22/20 0818      PT SHORT TERM GOAL #1   Title Patient will be independent in home exercise program to improve strength/mobility for better functional independence with ADLs.    Baseline 7/19: HEP given 8/25: HEP compliant    Time 2    Period Weeks    Status Achieved    Target Date 04/29/20             PT Long Term Goals - 05/22/20 0818      PT LONG TERM GOAL #1   Title Patient will increase FOTO score to equal to or greater than  51%   to demonstrate statistically significant improvement in mobility and quality of life.    Baseline 7/19: 29% 8/25: 62.8%    Time 8    Period Weeks    Status Achieved      PT LONG TERM GOAL #2   Title Patient will demonstrate an improved Berg Balance Score of > 33/56  as to demonstrate improved balance with ADLs such as sitting/standing and transfer balance and reduced fall risk.    Baseline 7/19: 27/56 8/25: 42/56    Time 8    Period Weeks    Status Achieved      PT LONG TERM GOAL #3   Title Patient will increase BLE gross strength to 4+/5 as to improve functional strength for independent gait, increased standing tolerance and increased ADL ability.    Baseline 7/19: see note 8/25: R knee flexion, df/pf 4-/5 ; L pf 4/5; rest 4+/5    Time 8    Period Weeks    Status Partially Met    Target Date 07/17/20      PT LONG TERM GOAL #4   Title Patient will deny any falls over past 4 weeks to demonstrate improved safety awareness at home and in the community.    Baseline 7/19: falls every day /25: no falls in past month    Time 8    Period Weeks    Status Achieved      PT LONG TERM GOAL #5   Title Patient will increase dynamic gait index score to >19/24 as to demonstrate reduced fall risk and improved dynamic gait balance for better safety with community/home ambulation.    Baseline 8/25: 10/24    Time 8    Period Weeks    Status New    Target Date 07/17/20       Additional Long Term Goals   Additional Long Term Goals Yes      PT LONG TERM GOAL #6   Title Patient will increase six minute walk  test distance to >1500 for progression towards age norm community ambulator and improve gait ability with decreased episodes of LOB.    Baseline 8/25: 1125 with 4 near falls requiring mod A to return to [facility],    Time 8    Period Weeks    Status New    Target Date 07/17/20                 Plan - 05/29/20 1659    Clinical Impression Statement Patient is challenged by pertubation's presented by resisted ambulation, reach, or unstable surface with limited ability to self weight shift initially, improved with repetition and cues. Crossing midline as well as alternating movement patterns is challenging for patient and requires multimodal cueing for performance.  Patient remains impulsive requiring cueing to watch and receive full instructions prior to starting to move. Pt would benefit from skilled PT to improve strength, stability, and coordination for improved mobility and decreased fall risk.    Personal Factors and Comorbidities Comorbidity 3+;Finances;Past/Current Experience;Time since onset of injury/illness/exacerbation;Transportation    Comorbidities arthritis, ataxia, CKD (one working kidney), colonic polyp, hyperlipidemia, migraine, pre-diabetes, total shoulder arthroplasty (2019)    Examination-Activity Limitations Bathing;Bend;Caring for Others;Carry;Dressing;Hygiene/Grooming;Stairs;Squat;Reach Overhead;Locomotion Level;Lift;Stand;Toileting;Transfers    Examination-Participation Restrictions Church;Cleaning;Community Activity;Driving;Laundry;Volunteer;Shop;Meal Prep;Yard Work    Merchant navy officer Evolving/Moderate complexity    Rehab Potential Fair    PT Frequency 2x / week    PT Duration 8 weeks    PT Treatment/Interventions ADLs/Self Care Home Management;Aquatic Therapy;Biofeedback;Cryotherapy;Canalith Repostioning;Electrical  Stimulation;Iontophoresis 57m/ml Dexamethasone;Moist Heat;Traction;Ultrasound;Therapeutic activities;Functional mobility training;Stair training;Gait training;DME Instruction;Therapeutic exercise;Balance training;Neuromuscular re-education;Patient/family education;Manual techniques;Passive range of motion;Dry needling;Energy conservation;Visual/perceptual remediation/compensation;Vestibular    PT Next Visit Plan ankle righting reactions, coordination, balance, strength    PT Home Exercise Plan no updates this session    Consulted and Agree with Plan of Care Patient           Patient will benefit from skilled therapeutic intervention in order to improve the following deficits and impairments:  Abnormal gait, Decreased activity tolerance, Decreased balance, Decreased knowledge of precautions, Decreased endurance, Decreased coordination, Decreased cognition, Decreased knowledge of use of DME, Decreased mobility, Decreased safety awareness, Difficulty walking, Decreased strength, Impaired perceived functional ability, Postural dysfunction, Improper body mechanics  Visit Diagnosis: Unsteadiness on feet  Muscle weakness (generalized)  Other abnormalities of gait and mobility     Problem List Patient Active Problem List   Diagnosis Date Noted  . GERD (gastroesophageal reflux disease) 05/13/2018  . Depression, major, single episode, mild ([facility] 05/13/2018  . 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[place] 01/04/2018  . Rotator cuff tear arthropathy, right 11/10/2017  . Decreased pedal pulses 11/01/2017  . Gait abnormality 10/21/2017  . Umbilical hernia without obstruction and without gangrene 09/13/2017  . Diabetes ([facility] 07/30/2017  . Hyperlipidemia 07/30/2017  . 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 Right shoulder pain 07/30/2017  . Chronic neck pain 07/30/2017  . Hypertension 07/30/2017   MJanna Arch PT, DPT   05/29/2020, 5:00 PM  [place] [facility] [address] [place] [address]Phone: [phone]  Fax:  [phone] Name: MMarsean ElkhatibRegister MRN: [id number] of Birth: 105-11-1950

## 2020-06-05 ENCOUNTER — Ambulatory Visit: Payer: Medicare Other

## 2020-06-05 ENCOUNTER — Other Ambulatory Visit: Payer: Self-pay

## 2020-06-05 DIAGNOSIS — M6281 Muscle weakness (generalized): Secondary | ICD-10-CM

## 2020-06-05 DIAGNOSIS — R2689 Other abnormalities of gait and mobility: Secondary | ICD-10-CM

## 2020-06-05 DIAGNOSIS — R2681 Unsteadiness on feet: Secondary | ICD-10-CM

## 2020-06-05 NOTE — Therapy (Signed)
[place] MAIN [facility] SERVICES [address] [place], [place], [address] Phone: [phone]   Fax:  [phone]  Physical Therapy Treatment  Patient Details  Name: Austin Richards MRN: [id number] Date of Birth: 1950-11-23 Referring Provider (PT): Dr. Caryl Bis, Angela Adam   Encounter Date: 06/05/2020   PT End of Session - 06/05/20 0805    Visit Number 13    Number of Visits 26    Date for PT Re-Evaluation 07/17/20    Authorization Type 3/10 PN 05/22/20    Authorization Time Period PT FOTO    PT Start Time 0759    PT Stop Time 0844    PT Time Calculation (min) 45 min    Equipment Utilized During Treatment Gait belt    Activity Tolerance Patient tolerated treatment well;No increased pain    Behavior During Therapy WFL for tasks assessed/performed           Past Medical History:  Diagnosis Date  . Arthritis   . Ataxia   . 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 Hx of colonic polyp   . Hyperlipidemia   . Migraine   . Pre-diabetes     Past Surgical History:  Procedure Laterality Date  . CHOLECYSTECTOMY    . TOTAL SHOULDER ARTHROPLASTY Right 11/10/2017   Procedure: TOTAL SHOULDER ARTHROPLASTY;  Surgeon: Hiram Gash, MD;  Location: [place];  Service: Orthopedics;  Laterality: Right;    There were no vitals filed for this visit.   Subjective Assessment - 06/05/20 0803    Subjective Patient reports he has been doing more floor exercises for his core. Has been protecting his right shoulder but it is not painful. 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 Patient has a PMH of arthritis, ataxia, CKD (one working kidney), colonic polyp, hyperlipidemia, migraine, pre-diabetes, total shoulder arthroplasty (2019). Patient presents with his sister who reports he is very  active, walks from trailer to [employer], gardens, Social research officer, government. Patient lives alone but sister lives down the road from him. Per patient report he has a hard time slowing down, falls frequently but is able to catch himself most of the time. At home he furniture surfs but recently got a rollator. Reports no dizziness. Symptoms began in 2015 and progressively worsened.    Limitations Lifting;Standing;Walking;House hold activities    How long can you sit comfortably? n/a    How long can you stand comfortably? a few minutes    How long can you walk comfortably? pain in hip limits him    Patient Stated Goals to get stronger and decrease falls    Currently in Pain? No/denies                Nustep Lvl 4 RPM> 60  3 minutes for cardiovascular support.    Matrix resisted walking 12.5 lb, 2x each direction, forward, backwards, lateral L, lateral R ; close CGA and occasional instability   Standing in // bars: GTB around ankles: side stepping in // bars; no UE support 4x length of // bars   GTB monster walks forward/backwards 4x length of // bars, cues for squat position    Tilt board: anterior/posterior 15x each foot position, 30x total;   airex pad tandem stance weighted ball (2000 gr) pass 12x each foot placement  In hallway: toss ball forward and backwards 2x 86 ft,   Dual task ambulation with signing grocery list, and garden to SPT with CGA from PT, decreased episodes of LOB.    6" step up/crane step up 8x each LE, UE support  Patient requires mod max cueing for sequencing with frequent visual cue due to limited/absent hearing.                       PT Education - 06/05/20 0804    Education Details exercise technique, body mechanics, stability    Person(s) Educated Patient    Methods Explanation;Demonstration;Tactile cues;Verbal cues    Comprehension Verbalized understanding;Returned demonstration;Verbal cues required;Tactile cues required            PT Short Term Goals  - 05/22/20 0818      PT SHORT TERM GOAL #1   Title Patient will be independent in home exercise program to improve strength/mobility for better functional independence with ADLs.    Baseline 7/19: HEP given 8/25: HEP compliant    Time 2    Period Weeks    Status Achieved    Target Date 04/29/20             PT Long Term Goals - 05/22/20 0818      PT LONG TERM GOAL #1   Title Patient will increase FOTO score to equal to or greater than  51%   to demonstrate statistically significant improvement in mobility and quality of life.    Baseline 7/19: 29% 8/25: 62.8%    Time 8    Period Weeks    Status Achieved      PT LONG TERM GOAL #2   Title Patient will demonstrate an improved Berg Balance Score of > 33/56  as to demonstrate improved balance with ADLs such as sitting/standing and transfer balance and reduced fall risk.    Baseline 7/19: 27/56 8/25: 42/56    Time 8    Period Weeks    Status Achieved      PT LONG TERM GOAL #3   Title Patient will increase BLE gross strength to 4+/5 as to improve functional strength for independent gait, increased standing tolerance and increased ADL ability.    Baseline 7/19: see note 8/25: R knee flexion, df/pf 4-/5 ; L pf 4/5; rest 4+/5    Time 8    Period Weeks    Status Partially Met    Target Date 07/17/20      PT LONG TERM GOAL #4   Title Patient will deny any falls over past 4 weeks to demonstrate improved safety awareness at home and in the community.    Baseline 7/19: falls every day /25: no falls in past month    Time 8    Period Weeks    Status Achieved      PT LONG TERM GOAL #5   Title Patient will increase dynamic gait index score to >19/24 as to demonstrate reduced fall risk and improved dynamic gait balance for better safety with community/home ambulation.    Baseline 8/25: 10/24    Time 8    Period Weeks    Status New    Target Date 07/17/20      Additional Long Term Goals   Additional Long Term Goals Yes      PT LONG  TERM GOAL #6   Title Patient will increase six minute walk test distance to >1500 for progression towards age norm community ambulator and  improve gait ability with decreased episodes of LOB.    Baseline 8/25: 1125 with 4 near falls requiring mod A to return to [facility],    Time 8    Period Weeks    Status New    Target Date 07/17/20                 Plan - 06/05/20 0964    Clinical Impression Statement Patient presents with excellent motivation throughout physical therapy session. Backwards ambulation with resistance is challenging for patient at this time. Dual task interventions are improving with stability and decreased episodes of LOB. Continued focus on eccentric stepping patterns for reduction of fall risk will benefit from patient. Pt would benefit from skilled PT to improve strength, stability, and coordination for improved mobility and decreased fall risk.    Personal Factors and Comorbidities Comorbidity 3+;Finances;Past/Current Experience;Time since onset of injury/illness/exacerbation;Transportation    Comorbidities arthritis, ataxia, CKD (one working kidney), colonic polyp, hyperlipidemia, migraine, pre-diabetes, total shoulder arthroplasty (2019)    Examination-Activity Limitations Bathing;Bend;Caring for Others;Carry;Dressing;Hygiene/Grooming;Stairs;Squat;Reach Overhead;Locomotion Level;Lift;Stand;Toileting;Transfers    Examination-Participation Restrictions Church;Cleaning;Community Activity;Driving;Laundry;Volunteer;Shop;Meal Prep;Yard Work    Merchant navy officer Evolving/Moderate complexity    Rehab Potential Fair    PT Frequency 2x / week    PT Duration 8 weeks    PT Treatment/Interventions ADLs/Self Care Home Management;Aquatic Therapy;Biofeedback;Cryotherapy;Canalith Repostioning;Electrical Stimulation;Iontophoresis 43m/ml Dexamethasone;Moist Heat;Traction;Ultrasound;Therapeutic activities;Functional mobility training;Stair training;Gait training;DME  Instruction;Therapeutic exercise;Balance training;Neuromuscular re-education;Patient/family education;Manual techniques;Passive range of motion;Dry needling;Energy conservation;Visual/perceptual remediation/compensation;Vestibular    PT Next Visit Plan ankle righting reactions, coordination, balance, strength    PT Home Exercise Plan no updates this session    Consulted and Agree with Plan of Care Patient           Patient will benefit from skilled therapeutic intervention in order to improve the following deficits and impairments:  Abnormal gait, Decreased activity tolerance, Decreased balance, Decreased knowledge of precautions, Decreased endurance, Decreased coordination, Decreased cognition, Decreased knowledge of use of DME, Decreased mobility, Decreased safety awareness, Difficulty walking, Decreased strength, Impaired perceived functional ability, Postural dysfunction, Improper body mechanics  Visit Diagnosis: Unsteadiness on feet  Muscle weakness (generalized)  Other abnormalities of gait and mobility     Problem List Patient Active Problem List   Diagnosis Date Noted  . GERD (gastroesophageal reflux disease) 05/13/2018  . Depression, major, single episode, mild ([place] 05/13/2018  . 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[place] 01/04/2018  . Rotator cuff tear arthropathy, right 11/10/2017  . Decreased pedal pulses 11/01/2017  . Gait abnormality 10/21/2017  . Umbilical hernia without obstruction and without gangrene 09/13/2017  . Diabetes ([place] 07/30/2017  . Hyperlipidemia 07/30/2017  . 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 Right shoulder pain 07/30/2017  . Chronic neck pain 07/30/2017  . Hypertension 07/30/2017   MJanna Arch PT, DPT   06/05/2020, 9:23 AM  [place]MAIN [facility]St[facility]SERVICES [address] [address]Phone: [phone]  Fax:  [phone] Name: Austin RacicotRegister MRN: [id number] of Birth: [birth year]

## 2020-06-10 ENCOUNTER — Ambulatory Visit: Payer: Medicare Other

## 2020-06-10 ENCOUNTER — Other Ambulatory Visit: Payer: Self-pay

## 2020-06-10 DIAGNOSIS — R2681 Unsteadiness on feet: Secondary | ICD-10-CM | POA: Diagnosis not present

## 2020-06-10 DIAGNOSIS — R2689 Other abnormalities of gait and mobility: Secondary | ICD-10-CM

## 2020-06-10 DIAGNOSIS — M6281 Muscle weakness (generalized): Secondary | ICD-10-CM | POA: Diagnosis not present

## 2020-06-10 NOTE — Therapy (Signed)
[place] MAIN [facility] SERVICES [address] [place], [place], [address] Phone: [phone]   Fax:  [phone]  Physical Therapy Treatment  Patient Details  Name: Austin Richards MRN: [id number] Date of Birth: 1951-03-19 Referring Provider (PT): Dr. Caryl Bis, Angela Adam   Encounter Date: 06/10/2020   PT End of Session - 06/10/20 0844    Visit Number 14    Number of Visits 26    Date for PT Re-Evaluation 07/17/20    Authorization Type 4/10 PN 05/22/20    Authorization Time Period PT FOTO    PT Start Time 0759    PT Stop Time 0843    PT Time Calculation (min) 44 min    Equipment Utilized During Treatment Gait belt    Activity Tolerance Patient tolerated treatment well;No increased pain    Behavior During Therapy WFL for tasks assessed/performed           Past Medical History:  Diagnosis Date  . Arthritis   . Ataxia   . 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 Hx of colonic polyp   . Hyperlipidemia   . Migraine   . Pre-diabetes     Past Surgical History:  Procedure Laterality Date  . CHOLECYSTECTOMY    . TOTAL SHOULDER ARTHROPLASTY Right 11/10/2017   Procedure: TOTAL SHOULDER ARTHROPLASTY;  Surgeon: Hiram Gash, MD;  Location: [place];  Service: Orthopedics;  Laterality: Right;    There were no vitals filed for this visit.   Subjective Assessment - 06/10/20 0801    Subjective Patient denies any falls since last visit. Patient notes he has been doing more core and tai chi. He reports a "pop" in his R hip when performing leg exercise.    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 Patient has a PMH of arthritis, ataxia, CKD (one working kidney), colonic polyp, hyperlipidemia, migraine, pre-diabetes, total shoulder arthroplasty (2019). Patient presents with his sister who reports he is very active,  walks from trailer to [employer], gardens, Social research officer, government. Patient lives alone but sister lives down the road from him. Per patient report he has a hard time slowing down, falls frequently but is able to catch himself most of the time. At home he furniture surfs but recently got a rollator. Reports no dizziness. Symptoms began in 2015 and progressively worsened.    Limitations Lifting;Standing;Walking;House hold activities    How long can you sit comfortably? n/a    How long can you stand comfortably? a few minutes    How long can you walk comfortably? pain in hip limits him    Patient Stated Goals to get stronger and decrease falls    Currently in Pain? No/denies           NuStep 4 minutes @ level 5  Standing: Balance on Airex pad with head turns: 10x horizontal, 10x vertical  Balance on Airex pad with eyes closed 15 seconds.  Balance on Airex pad with NBOS 15 seconds. PT provides verbal and tactile cues for slight knee flexion throughout task. For reduction of knee hyperextension   Kickbacks with blue resistance band around ankles. 15x each leg  Lateral squat steps with blue resistance band around ankles. 15x each leg.  Single leg balance with fingertip support. 30 second each leg   Single leg stance with Hip flexion/extension altnernating with 90 degrees of  knee flexion. 10x each LE   Sit to stand with feet on Airex pad. PT cues for push off with one hand in order to further challenge balance and strength. 10x (5x one hand 5x, other hand)   Squats with feet on Airex pad w/ BUE support. Chair behind for safety. 15x   Gait: Forward/backward walking with ball toss 160 ft  Performed 4x; occasional stopping and cues for task sequencing, close CGA   Side stepping with ball toss performed 4x 20 ft. PT cues for knee flexion to challenge task.  Walking with sudden initiation/termination of momentum for challenge and carryover to natural environment. PT cuts in front of patient while SPT guards in  order to mimic distracting environments. Performed 2x 86 ft with PT verbal warning. Performed additional 2x 86 ft without verbal warning.   Pt educated throughout session about proper posture and technique with exercises. Improved exercise technique, movement at target joints, use of target muscles after min to mod verbal, visual, tactile cues.                        PT Education - 06/10/20 0843    Education Details stability, pacing, exercise technique, body mechanics    Person(s) Educated Patient    Methods Explanation;Demonstration;Tactile cues;Verbal cues    Comprehension Verbalized understanding;Returned demonstration;Verbal cues required;Tactile cues required            PT Short Term Goals - 05/22/20 0818      PT SHORT TERM GOAL #1   Title Patient will be independent in home exercise program to improve strength/mobility for better functional independence with ADLs.    Baseline 7/19: HEP given 8/25: HEP compliant    Time 2    Period Weeks    Status Achieved    Target Date 04/29/20             PT Long Term Goals - 05/22/20 0818      PT LONG TERM GOAL #1   Title Patient will increase FOTO score to equal to or greater than  51%   to demonstrate statistically significant improvement in mobility and quality of life.    Baseline 7/19: 29% 8/25: 62.8%    Time 8    Period Weeks    Status Achieved      PT LONG TERM GOAL #2   Title Patient will demonstrate an improved Berg Balance Score of > 33/56  as to demonstrate improved balance with ADLs such as sitting/standing and transfer balance and reduced fall risk.    Baseline 7/19: 27/56 8/25: 42/56    Time 8    Period Weeks    Status Achieved      PT LONG TERM GOAL #3   Title Patient will increase BLE gross strength to 4+/5 as to improve functional strength for independent gait, increased standing tolerance and increased ADL ability.    Baseline 7/19: see note 8/25: R knee flexion, df/pf 4-/5 ; L pf 4/5;  rest 4+/5    Time 8    Period Weeks    Status Partially Met    Target Date 07/17/20      PT LONG TERM GOAL #4   Title Patient will deny any falls over past 4 weeks to demonstrate improved safety awareness at home and in the community.    Baseline 7/19: falls every day /25: no falls in past month    Time 8    Period Weeks    Status  Achieved      PT LONG TERM GOAL #5   Title Patient will increase dynamic gait index score to >19/24 as to demonstrate reduced fall risk and improved dynamic gait balance for better safety with community/home ambulation.    Baseline 8/25: 10/24    Time 8    Period Weeks    Status New    Target Date 07/17/20      Additional Long Term Goals   Additional Long Term Goals Yes      PT LONG TERM GOAL #6   Title Patient will increase six minute walk test distance to >1500 for progression towards age norm community ambulator and improve gait ability with decreased episodes of LOB.    Baseline 8/25: 1125 with 4 near falls requiring mod A to return to [facility],    Time 8    Period Weeks    Status New    Target Date 07/17/20                 Plan - 06/10/20 0847    Clinical Impression Statement Patient demonstrates improved balance during backward stepping tasks, indicated by LOB x2 with ability to self correct. Patient demonstrates improved BLE strength which reflects improved balance. Patient continues to require verbal and tactile cues for pacing of exercise and proper body mechanics. Pt will continue to benefit from skilled PT to improve strength, stability, and coordination for improved mobility and decreased fall risk.    Personal Factors and Comorbidities Comorbidity 3+;Finances;Past/Current Experience;Time since onset of injury/illness/exacerbation;Transportation    Comorbidities arthritis, ataxia, CKD (one working kidney), colonic polyp, hyperlipidemia, migraine, pre-diabetes, total shoulder arthroplasty (2019)    Examination-Activity Limitations  Bathing;Bend;Caring for Others;Carry;Dressing;Hygiene/Grooming;Stairs;Squat;Reach Overhead;Locomotion Level;Lift;Stand;Toileting;Transfers    Examination-Participation Restrictions Church;Cleaning;Community Activity;Driving;Laundry;Volunteer;Shop;Meal Prep;Yard Work    Merchant [employer] officer Evolving/Moderate complexity    Rehab Potential Fair    PT Frequency 2x / week    PT Duration 8 weeks    PT Treatment/Interventions ADLs/Self Care Home Management;Aquatic Therapy;Biofeedback;Cryotherapy;Canalith Repostioning;Electrical Stimulation;Iontophoresis 44m/ml Dexamethasone;Moist Heat;Traction;Ultrasound;Therapeutic activities;Functional mobility training;Stair training;Gait training;DME Instruction;Therapeutic exercise;Balance training;Neuromuscular re-education;Patient/family education;Manual techniques;Passive range of motion;Dry needling;Energy conservation;Visual/perceptual remediation/compensation;Vestibular    PT Next Visit Plan ankle righting reactions, coordination, balance, strength    PT Home Exercise Plan no updates this session    Consulted and Agree with Plan of Care Patient           Patient will benefit from skilled therapeutic intervention in order to improve the following deficits and impairments:  Abnormal gait, Decreased activity tolerance, Decreased balance, Decreased knowledge of precautions, Decreased endurance, Decreased coordination, Decreased cognition, Decreased knowledge of use of DME, Decreased mobility, Decreased safety awareness, Difficulty walking, Decreased strength, Impaired perceived functional ability, Postural dysfunction, Improper body mechanics  Visit Diagnosis: Unsteadiness on feet  Muscle weakness (generalized)  Other abnormalities of gait and mobility     Problem List Patient Active Problem List   Diagnosis Date Noted  . GERD (gastroesophageal reflux disease) 05/13/2018  . Depression, major, single episode, mild ([facility] 05/13/2018  .  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[facility] 01/04/2018  . Rotator cuff tear arthropathy, right 11/10/2017  . Decreased pedal pulses 11/01/2017  . Gait abnormality 10/21/2017  . Umbilical hernia without obstruction and without gangrene 09/13/2017  . Diabetes ([facility] 07/30/2017  . Hyperlipidemia 07/30/2017  . 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 Right shoulder pain 07/30/2017  . Chronic neck pain 07/30/2017  . Hypertension 07/30/2017   KTonny Bollman SPT  This entire session was performed under direct supervision and direction of a licensed therapist/therapist assistant .  I have personally read, edited and approve of the note as written.  Janna Arch, PT, DPT   06/10/2020, 1:11 PM  [place] MAIN [facility] SERVICES [address] [place], [place], [address] Phone: [phone]   Fax:  [phone]  Name: Farley Crooker Lender MRN: [id number] Date of Birth: [birth year]

## 2020-06-12 ENCOUNTER — Other Ambulatory Visit: Payer: Self-pay

## 2020-06-12 ENCOUNTER — Ambulatory Visit: Payer: Medicare Other

## 2020-06-12 DIAGNOSIS — M6281 Muscle weakness (generalized): Secondary | ICD-10-CM | POA: Diagnosis not present

## 2020-06-12 DIAGNOSIS — R2689 Other abnormalities of gait and mobility: Secondary | ICD-10-CM

## 2020-06-12 DIAGNOSIS — R2681 Unsteadiness on feet: Secondary | ICD-10-CM

## 2020-06-12 NOTE — Therapy (Signed)
[place] MAIN [facility] SERVICES [address] [place], [place], [address] Phone: [phone]   Fax:  [phone]  Physical Therapy Treatment  Patient Details  Name: Austin Richards MRN: [id number] Date of Birth: 05/18/1951 Referring Provider (PT): Dr. Caryl Bis, Angela Adam   Encounter Date: 06/12/2020   PT End of Session - 06/12/20 0805    Visit Number 15    Number of Visits 26    Date for PT Re-Evaluation 07/17/20    Authorization Type 5/10 PN 05/22/20    Authorization Time Period PT FOTO    PT Start Time 0800    PT Stop Time 0844    PT Time Calculation (min) 44 min    Equipment Utilized During Treatment Gait belt    Activity Tolerance Patient tolerated treatment well;No increased pain    Behavior During Therapy WFL for tasks assessed/performed           Past Medical History:  Diagnosis Date  . Arthritis   . Ataxia   . 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 Hx of colonic polyp   . Hyperlipidemia   . Migraine   . Pre-diabetes     Past Surgical History:  Procedure Laterality Date  . CHOLECYSTECTOMY    . TOTAL SHOULDER ARTHROPLASTY Right 11/10/2017   Procedure: TOTAL SHOULDER ARTHROPLASTY;  Surgeon: Hiram Gash, MD;  Location: [place];  Service: Orthopedics;  Laterality: Right;    There were no vitals filed for this visit.   Subjective Assessment - 06/12/20 0803    Subjective Patient reports his hearing aides had to be sent off to be repaired, will take 10 days. Is having having a hard time hearing himself, reports his balance has not changed.    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 Patient has a PMH of arthritis, ataxia, CKD (one working kidney), colonic polyp, hyperlipidemia, migraine, pre-diabetes, total shoulder arthroplasty (2019). Patient presents with his sister who reports he is very  active, walks from trailer to [employer], gardens, Social research officer, government. Patient lives alone but sister lives down the road from him. Per patient report he has a hard time slowing down, falls frequently but is able to catch himself most of the time. At home he furniture surfs but recently got a rollator. Reports no dizziness. Symptoms began in 2015 and progressively worsened.    Limitations Lifting;Standing;Walking;House hold activities    How long can you sit comfortably? n/a    How long can you stand comfortably? a few minutes    How long can you walk comfortably? pain in hip limits him    Patient Stated Goals to get stronger and decrease falls    Currently in Pain? No/denies           NuStep 4 minutes,  level 4, RPM> 80 for cardiovascular and musculoskeletal challenge.    Standing: Standing with # 5 ankle weight: CGA for stability  -Hip extension with bilateral upper extremity support, cueing for neutral hip alignment, upright posture for optimal muscle recruitment, and sequencing, 10x each LE,  -Hip abduction with bilateral upper extremity support, cueing for neutral foot alignment for correct muscle activation, 10x each LE -Hip flexion with bilateral upper extremity support, cueing for body mechanics, speed of muscle recruitment for optimal strengthening and stabilization 10x each LE -Hamstring curl with bilateral upper extremity support, cueing for knee  alignment for recruitment of hamstring musculature, 10x each LE -straight leg raise 10x each LE, BUE support, cues for not swinging LE  Seated with # 5lb ankle weights  -Seated marches with upright posture, back away from back of chair for abdominal/trunk activation/stabilization, 10x each LE -Seated LAQ with 3 second holds, 10x each LE, cueing for muscle activation and sequencing for neutral alignment -Seated IR/ER with cueing for stabilizing knee placement with lateral foot movement for optimal muscle recruitment, 10x each LE   airex pad balloon taps  reaching inside/outside BOS 3 minutes, SPT CGA, PT directing balloon        Gait: Matrix resisted ambulation #12.5 2x each direction, forward/backwards, left, right ; more challenging with left side step and return to the right with three near LOB. Posterior ambulation improved with repetition.     Pt educated throughout session about proper posture and technique with exercises. Improved exercise technique, movement at target joints, use of target muscles after min to mod verbal, visual, tactile cues                          PT Education - 06/12/20 0805    Education Details exercise technique, body mechanics    Person(s) Educated Patient    Methods Explanation;Demonstration;Tactile cues;Verbal cues    Comprehension Verbalized understanding;Returned demonstration;Verbal cues required;Tactile cues required            PT Short Term Goals - 05/22/20 0818      PT SHORT TERM GOAL #1   Title Patient will be independent in home exercise program to improve strength/mobility for better functional independence with ADLs.    Baseline 7/19: HEP given 8/25: HEP compliant    Time 2    Period Weeks    Status Achieved    Target Date 04/29/20             PT Long Term Goals - 05/22/20 0818      PT LONG TERM GOAL #1   Title Patient will increase FOTO score to equal to or greater than  51%   to demonstrate statistically significant improvement in mobility and quality of life.    Baseline 7/19: 29% 8/25: 62.8%    Time 8    Period Weeks    Status Achieved      PT LONG TERM GOAL #2   Title Patient will demonstrate an improved Berg Balance Score of > 33/56  as to demonstrate improved balance with ADLs such as sitting/standing and transfer balance and reduced fall risk.    Baseline 7/19: 27/56 8/25: 42/56    Time 8    Period Weeks    Status Achieved      PT LONG TERM GOAL #3   Title Patient will increase BLE gross strength to 4+/5 as to improve functional strength for  independent gait, increased standing tolerance and increased ADL ability.    Baseline 7/19: see note 8/25: R knee flexion, df/pf 4-/5 ; L pf 4/5; rest 4+/5    Time 8    Period Weeks    Status Partially Met    Target Date 07/17/20      PT LONG TERM GOAL #4   Title Patient will deny any falls over past 4 weeks to demonstrate improved safety awareness at home and in the community.    Baseline 7/19: falls every day /25: no falls in past month    Time 8    Period Weeks  Status Achieved      PT LONG TERM GOAL #5   Title Patient will increase dynamic gait index score to >19/24 as to demonstrate reduced fall risk and improved dynamic gait balance for better safety with community/home ambulation.    Baseline 8/25: 10/24    Time 8    Period Weeks    Status New    Target Date 07/17/20      Additional Long Term Goals   Additional Long Term Goals Yes      PT LONG TERM GOAL #6   Title Patient will increase six minute walk test distance to >1500 for progression towards age norm community ambulator and improve gait ability with decreased episodes of LOB.    Baseline 8/25: 1125 with 4 near falls requiring mod A to return to [facility],    Time 8    Period Weeks    Status New    Target Date 07/17/20                 Plan - 06/12/20 1130    Clinical Impression Statement Patient is progressing with functional strengthening and mobility with decreased need for rest breaks between interventions. Resisted ambulation continues to be an area for improvement as patient continues to require assistance due to LOB with challenge to return to COM. Pt will continue to benefit from skilled PT to improve strength, stability, and coordination for improved mobility and decreased fall risk.    Personal Factors and Comorbidities Comorbidity 3+;Finances;Past/Current Experience;Time since onset of injury/illness/exacerbation;Transportation    Comorbidities arthritis, ataxia, CKD (one working kidney), colonic polyp,  hyperlipidemia, migraine, pre-diabetes, total shoulder arthroplasty (2019)    Examination-Activity Limitations Bathing;Bend;Caring for Others;Carry;Dressing;Hygiene/Grooming;Stairs;Squat;Reach Overhead;Locomotion Level;Lift;Stand;Toileting;Transfers    Examination-Participation Restrictions Church;Cleaning;Community Activity;Driving;Laundry;Volunteer;Shop;Meal Prep;Yard Work    Merchant navy officer Evolving/Moderate complexity    Rehab Potential Fair    PT Frequency 2x / week    PT Duration 8 weeks    PT Treatment/Interventions ADLs/Self Care Home Management;Aquatic Therapy;Biofeedback;Cryotherapy;Canalith Repostioning;Electrical Stimulation;Iontophoresis 99m/ml Dexamethasone;Moist Heat;Traction;Ultrasound;Therapeutic activities;Functional mobility training;Stair training;Gait training;DME Instruction;Therapeutic exercise;Balance training;Neuromuscular re-education;Patient/family education;Manual techniques;Passive range of motion;Dry needling;Energy conservation;Visual/perceptual remediation/compensation;Vestibular    PT Next Visit Plan ankle righting reactions, coordination, balance, strength    PT Home Exercise Plan no updates this session    Consulted and Agree with Plan of Care Patient           Patient will benefit from skilled therapeutic intervention in order to improve the following deficits and impairments:  Abnormal gait, Decreased activity tolerance, Decreased balance, Decreased knowledge of precautions, Decreased endurance, Decreased coordination, Decreased cognition, Decreased knowledge of use of DME, Decreased mobility, Decreased safety awareness, Difficulty walking, Decreased strength, Impaired perceived functional ability, Postural dysfunction, Improper body mechanics  Visit Diagnosis: Unsteadiness on feet  Muscle weakness (generalized)  Other abnormalities of gait and mobility     Problem List Patient Active Problem List   Diagnosis Date Noted  . GERD  (gastroesophageal reflux disease) 05/13/2018  . Depression, major, single episode, mild ([facility] [place] 05/13/2018  . 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[facility] 01/04/2018  . Rotator cuff tear arthropathy, right 11/10/2017  . Decreased pedal pulses 11/01/2017  . Gait abnormality 10/21/2017  . Umbilical hernia without obstruction and without gangrene 09/13/2017  . Diabetes ([facility] 07/30/2017  . Hyperlipidemia 07/30/2017  . 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 Right shoulder pain 07/30/2017  . Chronic neck pain 07/30/2017  . Hypertension 07/30/2017   MJanna Arch PT, DPT   06/12/2020, 11:32 AM  [facility] [phone]  [place], [place], [address] Phone: [phone]   Fax:  [phone]  Name: Austin Richards MRN: [id number] Date of Birth: [birth year]

## 2020-06-17 ENCOUNTER — Ambulatory Visit: Payer: Medicare Other

## 2020-06-17 ENCOUNTER — Other Ambulatory Visit: Payer: Self-pay

## 2020-06-17 DIAGNOSIS — M6281 Muscle weakness (generalized): Secondary | ICD-10-CM | POA: Diagnosis not present

## 2020-06-17 DIAGNOSIS — R2681 Unsteadiness on feet: Secondary | ICD-10-CM | POA: Diagnosis not present

## 2020-06-17 DIAGNOSIS — R2689 Other abnormalities of gait and mobility: Secondary | ICD-10-CM | POA: Diagnosis not present

## 2020-06-17 NOTE — Therapy (Signed)
[place] MAIN [facility] SERVICES [address] [place], [place], [address] Phone: [phone]   Fax:  [phone]  Physical Therapy Treatment  Patient Details  Name: Kolbey Teichert Laverne MRN: [id number] Date of Birth: [birth year] Referring Provider (PT): Dr. Caryl Bis, Angela Adam   Encounter Date: 06/17/2020   PT End of Session - 06/17/20 0756    Visit Number 16    Number of Visits 26    Date for PT Re-Evaluation 07/17/20    Authorization Type 6/10 PN 05/22/20    Authorization Time Period PT FOTO    PT Start Time 0800    PT Stop Time 0844    PT Time Calculation (min) 44 min    Equipment Utilized During Treatment Gait belt    Activity Tolerance Patient tolerated treatment well;No increased pain    Behavior During Therapy WFL for tasks assessed/performed           Past Medical History:  Diagnosis Date  . Arthritis   . Ataxia   . 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 Hx of colonic polyp   . Hyperlipidemia   . Migraine   . Pre-diabetes     Past Surgical History:  Procedure Laterality Date  . CHOLECYSTECTOMY    . TOTAL SHOULDER ARTHROPLASTY Right 11/10/2017   Procedure: TOTAL SHOULDER ARTHROPLASTY;  Surgeon: Hiram Gash, MD;  Location: [place];  Service: Orthopedics;  Laterality: Right;    There were no vitals filed for this visit.   Subjective Assessment - 06/17/20 0803    Subjective Patient reports bilateral hips feeling more tight than usual today, most likely due to increased exercise over the weekend. He still does not have his hearing aides, but notes his balance feels okay.    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 Patient has a PMH of arthritis, ataxia, CKD (one working kidney), colonic polyp, hyperlipidemia, migraine, pre-diabetes, total shoulder arthroplasty (2019). Patient presents with his  sister who reports he is very active, walks from trailer to [employer], gardens, Social research officer, government. Patient lives alone but sister lives down the road from him. Per patient report he has a hard time slowing down, falls frequently but is able to catch himself most of the time. At home he furniture surfs but recently got a rollator. Reports no dizziness. Symptoms began in 2015 and progressively worsened.    Limitations Lifting;Standing;Walking;House hold activities    How long can you sit comfortably? n/a    How long can you stand comfortably? a few minutes    How long can you walk comfortably? pain in hip limits him    Patient Stated Goals to get stronger and decrease falls    Currently in Pain? Yes    Pain Score 2     Pain Location Hip    Pain Orientation Right;Left    Pain Descriptors / Indicators Sore    Pain Type Chronic pain    Pain Onset 1 to 4 weeks ago    Pain Frequency Intermittent           Treatment: - NuStep 4 minutes, level 5 for BLE and BUE coordination, RPM >50 for cardiovascular training - Obstacle course consisting of hurdles, foam pads, dynadisc, and cones to challenge balance and coordination. CGA for safety. Performed 4x. - Progressed to single leg squat from raised plinth table. CGA for safety.  12x each leg. Max verbal and visual cues for correct performance of task. Requires use of PVC pipe for UE support for stabilization  - Sit to stand passes. Patient passes rainbow ball when coming from sit > stand. 12x  Standing in // Bars: - Ball kicks 12x each leg. CGA for safety. Patient uses no UE support to further challenge balance. - Rockerboard balance 45 seconds A/P, 45 seconds M/L. CGA for safety. SPT cues for slight knee flexion throughout task.   Gait: Patient ambulates 86 ft x 2 while tossing rainbow ball in air to challenge balance during dual tasking. CGA for safety. Patient displays near LOB x 4 but is able to self-stabilize using step strategy. SPT cues for tossing ball faster  to further challenge task. Performed an additional 86 ft x 2.  Patient ambulates backwards 86 ft x 2 while tossing rainbow ball in air. CGA for safety. Patient demonstrates near LOB x 3, using step strategy and min A from SPT to stabilize.  Progressed gait training to include cognitive dual tasking. Patient asked to recite directions to his home from the facility. Patient ambulates ~172f x 1 during task.  Pt educated throughout session about proper posture and technique with exercises. Improved exercise technique, movement at target joints, use of target muscles after min to mod verbal, visual, tactile cues.                        PT Education - 06/17/20 0754    Education Details body mechanics, exercise technique, pacing    Person(s) Educated Patient    Methods Explanation;Demonstration;Tactile cues;Verbal cues    Comprehension Verbalized understanding;Returned demonstration;Verbal cues required;Tactile cues required            PT Short Term Goals - 05/22/20 0818      PT SHORT TERM GOAL #1   Title Patient will be independent in home exercise program to improve strength/mobility for better functional independence with ADLs.    Baseline 7/19: HEP given 8/25: HEP compliant    Time 2    Period Weeks    Status Achieved    Target Date 04/29/20             PT Long Term Goals - 05/22/20 0818      PT LONG TERM GOAL #1   Title Patient will increase FOTO score to equal to or greater than  51%   to demonstrate statistically significant improvement in mobility and quality of life.    Baseline 7/19: 29% 8/25: 62.8%    Time 8    Period Weeks    Status Achieved      PT LONG TERM GOAL #2   Title Patient will demonstrate an improved Berg Balance Score of > 33/56  as to demonstrate improved balance with ADLs such as sitting/standing and transfer balance and reduced fall risk.    Baseline 7/19: 27/56 8/25: 42/56    Time 8    Period Weeks    Status Achieved      PT  LONG TERM GOAL #3   Title Patient will increase BLE gross strength to 4+/5 as to improve functional strength for independent gait, increased standing tolerance and increased ADL ability.    Baseline 7/19: see note 8/25: R knee flexion, df/pf 4-/5 ; L pf 4/5; rest 4+/5    Time 8    Period Weeks    Status Partially Met    Target Date 07/17/20  PT LONG TERM GOAL #4   Title Patient will deny any falls over past 4 weeks to demonstrate improved safety awareness at home and in the community.    Baseline 7/19: falls every day /25: no falls in past month    Time 8    Period Weeks    Status Achieved      PT LONG TERM GOAL #5   Title Patient will increase dynamic gait index score to >19/24 as to demonstrate reduced fall risk and improved dynamic gait balance for better safety with community/home ambulation.    Baseline 8/25: 10/24    Time 8    Period Weeks    Status New    Target Date 07/17/20      Additional Long Term Goals   Additional Long Term Goals Yes      PT LONG TERM GOAL #6   Title Patient will increase six minute walk test distance to >1500 for progression towards age norm community ambulator and improve gait ability with decreased episodes of LOB.    Baseline 8/25: 1125 with 4 near falls requiring mod A to return to [facility],    Time 8    Period Weeks    Status New    Target Date 07/17/20                 Plan - 06/17/20 0901    Clinical Impression Statement Patient demonstrates improved balance and activity tolerance this session, requiring fewer seated rest breaks. No exacerbation of hip pain throughout session. Patient continues to require increased time to find COM after balance perturbations. Pt will continue to benefit from skilled PT to improve strength, stability, and coordination for improved mobility and decreased fall risk.    Personal Factors and Comorbidities Comorbidity 3+;Finances;Past/Current Experience;Time since onset of  injury/illness/exacerbation;Transportation    Comorbidities arthritis, ataxia, CKD (one working kidney), colonic polyp, hyperlipidemia, migraine, pre-diabetes, total shoulder arthroplasty (2019)    Examination-Activity Limitations Bathing;Bend;Caring for Others;Carry;Dressing;Hygiene/Grooming;Stairs;Squat;Reach Overhead;Locomotion Level;Lift;Stand;Toileting;Transfers    Examination-Participation Restrictions Church;Cleaning;Community Activity;Driving;Laundry;Volunteer;Shop;Meal Prep;Yard Work    Merchant [employer] officer Evolving/Moderate complexity    Rehab Potential Fair    PT Frequency 2x / week    PT Duration 8 weeks    PT Treatment/Interventions ADLs/Self Care Home Management;Aquatic Therapy;Biofeedback;Cryotherapy;Canalith Repostioning;Electrical Stimulation;Iontophoresis 56m/ml Dexamethasone;Moist Heat;Traction;Ultrasound;Therapeutic activities;Functional mobility training;Stair training;Gait training;DME Instruction;Therapeutic exercise;Balance training;Neuromuscular re-education;Patient/family education;Manual techniques;Passive range of motion;Dry needling;Energy conservation;Visual/perceptual remediation/compensation;Vestibular    PT Next Visit Plan ankle righting reactions, coordination, balance, strength    PT Home Exercise Plan no updates this session    Consulted and Agree with Plan of Care Patient           Patient will benefit from skilled therapeutic intervention in order to improve the following deficits and impairments:  Abnormal gait, Decreased activity tolerance, Decreased balance, Decreased knowledge of precautions, Decreased endurance, Decreased coordination, Decreased cognition, Decreased knowledge of use of DME, Decreased mobility, Decreased safety awareness, Difficulty walking, Decreased strength, Impaired perceived functional ability, Postural dysfunction, Improper body mechanics  Visit Diagnosis: Unsteadiness on feet  Muscle weakness (generalized)  Other  abnormalities of gait and mobility     Problem List Patient Active Problem List   Diagnosis Date Noted  . GERD (gastroesophageal reflux disease) 05/13/2018  . Depression, major, single episode, mild ([facility] 05/13/2018  . 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[facility] 01/04/2018  . Rotator cuff tear arthropathy, right 11/10/2017  . Decreased pedal pulses 11/01/2017  . Gait abnormality 10/21/2017  . Umbilical hernia without obstruction and without gangrene 09/13/2017  . Diabetes (  HCC) 07/30/2017  . Hyperlipidemia 07/30/2017  . 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 Right shoulder pain 07/30/2017  . Chronic neck pain 07/30/2017  . Hypertension 07/30/2017   Kathryn Balardi, SPT  This entire session was performed under direct supervision and direction of a licensed therapist/therapist assistant . I have personally read, edited and approve of the note as written.  Marina Moser, PT, DPT    06/17/2020, 10:45 AM  [facility] SERVICES [address] [place], [place], [address] Phone: [phone]   Fax:  [phone]  Name: Jencarlos Jerome Saravia MRN: [id number] Date of Birth: 04/16/1951   

## 2020-06-19 ENCOUNTER — Other Ambulatory Visit: Payer: Self-pay

## 2020-06-19 ENCOUNTER — Ambulatory Visit: Payer: Medicare Other

## 2020-06-19 DIAGNOSIS — M6281 Muscle weakness (generalized): Secondary | ICD-10-CM

## 2020-06-19 DIAGNOSIS — R2681 Unsteadiness on feet: Secondary | ICD-10-CM

## 2020-06-19 DIAGNOSIS — R2689 Other abnormalities of gait and mobility: Secondary | ICD-10-CM

## 2020-06-19 NOTE — Therapy (Signed)
[place] MAIN [facility] SERVICES [address] [place], [place], [address] Phone: [phone]   Fax:  [phone]  Physical Therapy Treatment  Patient Details  Name: Austin Richards MRN: [id number] Date of Birth: 02-Jan-1951 Referring Provider (PT): Dr. Caryl Bis, Angela Adam   Encounter Date: 06/19/2020   PT End of Session - 06/19/20 0758    Visit Number 17    Number of Visits 26    Date for PT Re-Evaluation 07/17/20    Authorization Type 7/10 PN 05/22/20    Authorization Time Period PT FOTO    PT Start Time 0800    PT Stop Time 0844    PT Time Calculation (min) 44 min    Equipment Utilized During Treatment Gait belt    Activity Tolerance Patient tolerated treatment well;No increased pain    Behavior During Therapy WFL for tasks assessed/performed           Past Medical History:  Diagnosis Date  . Arthritis   . Ataxia   . 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 Hx of colonic polyp   . Hyperlipidemia   . Migraine   . Pre-diabetes     Past Surgical History:  Procedure Laterality Date  . CHOLECYSTECTOMY    . TOTAL SHOULDER ARTHROPLASTY Right 11/10/2017   Procedure: TOTAL SHOULDER ARTHROPLASTY;  Surgeon: Hiram Gash, MD;  Location: [place];  Service: Orthopedics;  Laterality: Right;    There were no vitals filed for this visit.   Subjective Assessment - 06/19/20 0802    Subjective Patient did not bring his rollator today because of the rain. Is getting his hearing aides tomorrow. Reports no falls or LOB since last session. Has some hip discomfort but no pain.    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 Patient has a PMH of arthritis, ataxia, CKD (one working kidney), colonic polyp, hyperlipidemia, migraine, pre-diabetes, total shoulder arthroplasty (2019). Patient presents with his sister who reports  he is very active, walks from trailer to [employer], gardens, Social research officer, government. Patient lives alone but sister lives down the road from him. Per patient report he has a hard time slowing down, falls frequently but is able to catch himself most of the time. At home he furniture surfs but recently got a rollator. Reports no dizziness. Symptoms began in 2015 and progressively worsened.    Limitations Lifting;Standing;Walking;House hold activities    How long can you sit comfortably? n/a    How long can you stand comfortably? a few minutes    How long can you walk comfortably? pain in hip limits him    Patient Stated Goals to get stronger and decrease falls    Currently in Pain? No/denies             NuStep 4-5 minutes,  level 4, RPM> 80 for cardiovascular and musculoskeletal challenge.   TherEx: cues for safe body mechanics, CGA for safety  Standing with # 5 ankle weight: CGA for stability  -Hip extension with bilateral upper extremity support, cueing for neutral hip alignment, upright posture for optimal muscle recruitment, and sequencing, 15x each LE,  -Hip abduction with bilateral upper extremity support, cueing for neutral foot alignment for correct muscle activation, 15x each LE    Seated with # 5lb ankle weights  -Seated LAQ with 3 second holds, 10x each LE, cueing for muscle  activation and sequencing for neutral alignment -Seated IR/ER with cueing for stabilizing knee placement with lateral foot movement for optimal muscle recruitment, 15x each LE        Neuro Re-ed: CGA and cues for retaining COM against unstable surfaces and resistance: performed in // bars for safety    airex balance beam : side stepping cues for decreased velocity for improved control, 6x length of // bars  Ambulate in hallway with sudden termination of movement to avoid PT in front for carryover to home environment 2x 86 ft BTB pertubation's applied 2x 86 ft with ambulation, challenging to the R and upward  the most.   Modified tandem stance airex pads: one foot each color, reach for ball and toss into hoop for increased pertubation x 15 each foot placement.   Lateral modified bosu lunges 8x each LE, cues for weight shift for weight acceptance and no UE support  Small wobble board:  -lateral weight shift for stabilization,ability to retain COM and core activation. Cues for slight knee flexion x 20 -anterior/posterior weight shift for stabilization,ability to retain COM and core activation. Cues for slight knee flexion x 20  Pt educated throughout session about proper posture and technique with exercises. Improved exercise technique, movement at target joints, use of target muscles after min to mod verbal, visual, tactile cues                     PT Education - 06/19/20 0758    Education Details exercise technique, body mechanics    Person(s) Educated Patient    Methods Explanation;Demonstration;Tactile cues;Verbal cues    Comprehension Verbalized understanding;Returned demonstration;Verbal cues required;Tactile cues required            PT Short Term Goals - 05/22/20 0818      PT SHORT TERM GOAL #1   Title Patient will be independent in home exercise program to improve strength/mobility for better functional independence with ADLs.    Baseline 7/19: HEP given 8/25: HEP compliant    Time 2    Period Weeks    Status Achieved    Target Date 04/29/20             PT Long Term Goals - 05/22/20 0818      PT LONG TERM GOAL #1   Title Patient will increase FOTO score to equal to or greater than  51%   to demonstrate statistically significant improvement in mobility and quality of life.    Baseline 7/19: 29% 8/25: 62.8%    Time 8    Period Weeks    Status Achieved      PT LONG TERM GOAL #2   Title Patient will demonstrate an improved Berg Balance Score of > 33/56  as to demonstrate improved balance with ADLs such as sitting/standing and transfer balance and  reduced fall risk.    Baseline 7/19: 27/56 8/25: 42/56    Time 8    Period Weeks    Status Achieved      PT LONG TERM GOAL #3   Title Patient will increase BLE gross strength to 4+/5 as to improve functional strength for independent gait, increased standing tolerance and increased ADL ability.    Baseline 7/19: see note 8/25: R knee flexion, df/pf 4-/5 ; L pf 4/5; rest 4+/5    Time 8    Period Weeks    Status Partially Met    Target Date 07/17/20      PT LONG TERM GOAL #4  Title Patient will deny any falls over past 4 weeks to demonstrate improved safety awareness at home and in the community.    Baseline 7/19: falls every day /25: no falls in past month    Time 8    Period Weeks    Status Achieved      PT LONG TERM GOAL #5   Title Patient will increase dynamic gait index score to >19/24 as to demonstrate reduced fall risk and improved dynamic gait balance for better safety with community/home ambulation.    Baseline 8/25: 10/24    Time 8    Period Weeks    Status New    Target Date 07/17/20      Additional Long Term Goals   Additional Long Term Goals Yes      PT LONG TERM GOAL #6   Title Patient will increase six minute walk test distance to >1500 for progression towards age norm community ambulator and improve gait ability with decreased episodes of LOB.    Baseline 8/25: 1125 with 4 near falls requiring mod A to return to [facility],    Time 8    Period Weeks    Status New    Target Date 07/17/20                 Plan - 06/19/20 0827    Clinical Impression Statement Patient is improving with his stability with sudden termination of movement with decreased episodes of LOB. Strengthening of LE's improved with increased tolerance of reps for interventions. Single leg stability continues to be area of improvement as well as coordination.   Pt will continue to benefit from skilled PT to improve strength, stability, and coordination for improved mobility and decreased fall  risk.    Personal Factors and Comorbidities Comorbidity 3+;Finances;Past/Current Experience;Time since onset of injury/illness/exacerbation;Transportation    Comorbidities arthritis, ataxia, CKD (one working kidney), colonic polyp, hyperlipidemia, migraine, pre-diabetes, total shoulder arthroplasty (2019)    Examination-Activity Limitations Bathing;Bend;Caring for Others;Carry;Dressing;Hygiene/Grooming;Stairs;Squat;Reach Overhead;Locomotion Level;Lift;Stand;Toileting;Transfers    Examination-Participation Restrictions Church;Cleaning;Community Activity;Driving;Laundry;Volunteer;Shop;Meal Prep;Yard Work    Merchant navy officer Evolving/Moderate complexity    Rehab Potential Fair    PT Frequency 2x / week    PT Duration 8 weeks    PT Treatment/Interventions ADLs/Self Care Home Management;Aquatic Therapy;Biofeedback;Cryotherapy;Canalith Repostioning;Electrical Stimulation;Iontophoresis 37m/ml Dexamethasone;Moist Heat;Traction;Ultrasound;Therapeutic activities;Functional mobility training;Stair training;Gait training;DME Instruction;Therapeutic exercise;Balance training;Neuromuscular re-education;Patient/family education;Manual techniques;Passive range of motion;Dry needling;Energy conservation;Visual/perceptual remediation/compensation;Vestibular    PT Next Visit Plan ankle righting reactions, coordination, balance, strength    PT Home Exercise Plan no updates this session    Consulted and Agree with Plan of Care Patient           Patient will benefit from skilled therapeutic intervention in order to improve the following deficits and impairments:  Abnormal gait, Decreased activity tolerance, Decreased balance, Decreased knowledge of precautions, Decreased endurance, Decreased coordination, Decreased cognition, Decreased knowledge of use of DME, Decreased mobility, Decreased safety awareness, Difficulty walking, Decreased strength, Impaired perceived functional ability, Postural  dysfunction, Improper body mechanics  Visit Diagnosis: Unsteadiness on feet  Muscle weakness (generalized)  Other abnormalities of gait and mobility     Problem List Patient Active Problem List   Diagnosis Date Noted  . GERD (gastroesophageal reflux disease) 05/13/2018  . Depression, major, single episode, mild ([facility] 05/13/2018  . 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[facility] 01/04/2018  . Rotator cuff tear arthropathy, right 11/10/2017  . Decreased pedal pulses 11/01/2017  . Gait abnormality 10/21/2017  . Umbilical hernia without obstruction and without gangrene 09/13/2017  .  Diabetes ([place]) 07/30/2017  . Hyperlipidemia 07/30/2017  . 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 Right shoulder pain 07/30/2017  . Chronic neck pain 07/30/2017  . Hypertension 07/30/2017   Janna Arch, PT, DPT   06/19/2020, 8:45 AM  [place] MAIN [facility] SERVICES [address] [place], [place], [address] Phone: [phone]   Fax:  [phone]  Name: Austin Richards MRN: [id number] Date of Birth: [birth year]

## 2020-06-24 ENCOUNTER — Other Ambulatory Visit: Payer: Self-pay

## 2020-06-24 ENCOUNTER — Ambulatory Visit: Payer: Medicare Other

## 2020-06-24 DIAGNOSIS — R2689 Other abnormalities of gait and mobility: Secondary | ICD-10-CM | POA: Diagnosis not present

## 2020-06-24 DIAGNOSIS — M6281 Muscle weakness (generalized): Secondary | ICD-10-CM | POA: Diagnosis not present

## 2020-06-24 DIAGNOSIS — R2681 Unsteadiness on feet: Secondary | ICD-10-CM | POA: Diagnosis not present

## 2020-06-24 NOTE — Therapy (Signed)
[place] MAIN [facility] SERVICES [address] [place], [place], [address] Phone: [phone]   Fax:  [phone]  Physical Therapy Treatment  Patient Details  Name: [place] Richards MRN: [id number] Date of Birth: Jan 29, 1951 Referring Provider (PT): Dr. Caryl Bis, Angela Adam   Encounter Date: 06/24/2020   PT End of Session - 06/24/20 0803    Visit Number 18    Number of Visits 26    Date for PT Re-Evaluation 07/17/20    Authorization Type 8/10 PN 05/22/20    Authorization Time Period PT FOTO    PT Start Time 0759    PT Stop Time 0845    PT Time Calculation (min) 46 min    Equipment Utilized During Treatment Gait belt    Activity Tolerance Patient tolerated treatment well;No increased pain    Behavior During Therapy WFL for tasks assessed/performed           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   Subjective Assessment - 06/24/20 0759    Subjective Patient did not bring his rollator this session because he wants to "work on his balance". Denies falls or LOB since last session.    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 Patient has a PMH of arthritis, ataxia, CKD (one working kidney), colonic polyp, hyperlipidemia, migraine, pre-diabetes, total shoulder arthroplasty (2019). 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 Patient lives alone but sister lives down the road from him. Per patient report he has a hard time slowing down, falls frequently but is able to catch himself most of the time. At home he furniture surfs but recently got a rollator. Reports no dizziness. Symptoms began in 2015 and progressively worsened.    Limitations Lifting;Standing;Walking;House hold activities    How long can you sit comfortably? n/a    How long can you stand comfortably? a few minutes    How long can you walk comfortably? pain in hip limits him    Patient Stated Goals to get stronger and decrease falls    Pain Score 5     Pain Location Hip    Pain Orientation Right    Pain Descriptors / Indicators Aching    Pain Type Chronic pain                 Treatment: - NuStep 4 minutes, level 5 for BLE and BUE coordination, RPM >50 for cardiovascular training - Matrix resisted walking 12.5# forward/backward, side stepping each way. Performed 2x in each direction. CGA for safety.  Standing in // Bars: - 6" step sandwiched between foam pads: stepping forward/backward with one foot staying on step. CGA for safety. Near LOB x 3 due to L missed foot placement. Performed 10x each leg (20x total) - 6 step sandwiched  between foam pads: side step up/over. CGA for safety. 10x each side (20x total) - Side step on foam balance beam. Near LOB x 2. Performed 6x.       -     Standing on foam balance beam: tossing ball into hoop for perturbation. 15 x 1 set. CGA for safety. Distance of hoop placed further away to challenge dynamic balance. Additional 15 x 1 set performed. Patient demonstrates posterior lean by end of task.    Pt educated throughout session about proper posture and technique with exercises. Improved exercise technique, movement at target joints, use of target muscles after min to mod verbal, visual, tactile cues.                        PT Education - 06/24/20 0758    Education Details  exercise technique, body mechanics, pacing    Person(s) Educated Patient    Methods Explanation;Demonstration;Tactile cues;Verbal cues    Comprehension Verbalized understanding;Returned demonstration;Verbal cues required;Tactile cues required            PT Short Term Goals - 05/22/20 0818      PT SHORT TERM GOAL #1   Title Patient will be independent in home exercise program to improve strength/mobility for better functional independence with ADLs.    Baseline 7/19: HEP given 8/25: HEP compliant    Time 2    Period Weeks    Status Achieved    Target Date 04/29/20             PT Long Term Goals - 05/22/20 0818      PT LONG TERM GOAL #1   Title Patient will increase FOTO score to equal to or greater than  51%   to demonstrate statistically significant improvement in mobility and quality of life.    Baseline 7/19: 29% 8/25: 62.8%    Time 8    Period Weeks    Status Achieved      PT LONG TERM GOAL #2   Title Patient will demonstrate an improved Berg Balance Score of > 33/56  as to demonstrate improved balance with ADLs such as sitting/standing and transfer balance and reduced fall risk.    Baseline 7/19: 27/56 8/25: 42/56    Time 8    Period Weeks    Status Achieved      PT LONG TERM GOAL #3   Title Patient will increase BLE gross strength to 4+/5 as to improve functional strength for independent gait, increased standing tolerance and increased ADL ability.    Baseline 7/19: see note 8/25: R knee flexion, df/pf 4-/5 ; L pf 4/5; rest 4+/5    Time 8    Period Weeks    Status Partially Met    Target Date 07/17/20      PT LONG TERM GOAL #4   Title Patient will deny any falls over past 4 weeks to demonstrate improved safety awareness at home and in the community.    Baseline 7/19: falls every day /25: no falls in past month    Time 8    Period Weeks    Status Achieved      PT LONG TERM GOAL #5   Title Patient will increase dynamic gait index score to >19/24 as to  demonstrate reduced fall risk and improved dynamic gait balance for better safety with community/home ambulation.    Baseline 8/25: 10/24    Time 8    Period Weeks    Status  New    Target Date 07/17/20      Additional Long Term Goals   Additional Long Term Goals Yes      PT LONG TERM GOAL #6   Title Patient will increase six minute walk test distance to >1500 for progression towards age norm community ambulator and improve gait ability with decreased episodes of LOB.    Baseline 8/25: 1125 with 4 near falls requiring mod A to return to [facility],    Time 8    Period Weeks    Status New    Target Date 07/17/20                 Plan - 06/24/20 0853    Clinical Impression Statement Patient demonstrates improved balance in sagittal plane, but continues to have near LOB with frontal plane movements. Patient tends to lose balance when performing interventions too quickly, being unable to control placement of BLEs. Patient requires cues for pacing throughout session in order to increase stability. Pt will continue to benefit from skilled PT to improve strength, stability, and coordination for improved mobility and decreased fall risk.    Personal Factors and Comorbidities Comorbidity 3+;Finances;Past/Current Experience;Time since onset of injury/illness/exacerbation;Transportation    Comorbidities arthritis, ataxia, CKD (one working kidney), colonic polyp, hyperlipidemia, migraine, pre-diabetes, total shoulder arthroplasty (2019)    Examination-Activity Limitations Bathing;Bend;Caring for Others;Carry;Dressing;Hygiene/Grooming;Stairs;Squat;Reach Overhead;Locomotion Level;Lift;Stand;Toileting;Transfers    Examination-Participation Restrictions Church;Cleaning;Community Activity;Driving;Laundry;Volunteer;Shop;Meal Prep;Yard Work    [employer]chant [employer] officer Evolving/Moderate complexity    Rehab Potential Fair    PT Frequency 2x / week    PT Duration 8 weeks    PT Treatment/Interventions  ADLs/Self Care Home Management;Aquatic Therapy;Biofeedback;Cryotherapy;Canalith Repostioning;Electrical Stimulation;Iontophoresis 4mg /ml Dexamethasone;Moist Heat;Traction;Ultrasound;Therapeutic activities;Functional mobility training;Stair training;Gait training;DME Instruction;Therapeutic exercise;Balance training;Neuromuscular re-education;Patient/family education;Manual techniques;Passive range of motion;Dry needling;Energy conservation;Visual/perceptual remediation/compensation;Vestibular    PT Next Visit Plan frontal plane balance, coordination, reactive balance    PT Home Exercise Plan no updates this session    Consulted and Agree with Plan of Care Patient           Patient will benefit from skilled therapeutic intervention in order to improve the following deficits and impairments:  Abnormal gait, Decreased activity tolerance, Decreased balance, Decreased knowledge of precautions, Decreased endurance, Decreased coordination, Decreased cognition, Decreased knowledge of use of DME, Decreased mobility, Decreased safety awareness, Difficulty walking, Decreased strength, Impaired perceived functional ability, Postural dysfunction, Improper body mechanics  Visit Diagnosis: Unsteadiness on feet  Muscle weakness (generalized)  Other abnormalities of gait and mobility     Problem List Patient Active Problem List   Diagnosis Date Noted   GERD (gastroesophageal reflux disease) 05/13/2018   Depression, major, single episode, mild ([place]) 05/13/2018   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 I have personally read, edited and approve of the note as written.  Janna Arch, PT, DPT   06/24/2020, 9:51 AM  [place] MAIN [facility] SERVICES [address] [place], [place], [address] Phone: [phone]   Fax:  [phone]  Name: [place] Richards MRN: [id number] Date of  Birth: 12/21/1950

## 2020-06-26 ENCOUNTER — Other Ambulatory Visit: Payer: Self-pay

## 2020-06-26 ENCOUNTER — Ambulatory Visit: Payer: Medicare Other

## 2020-06-26 DIAGNOSIS — M6281 Muscle weakness (generalized): Secondary | ICD-10-CM | POA: Diagnosis not present

## 2020-06-26 DIAGNOSIS — R2689 Other abnormalities of gait and mobility: Secondary | ICD-10-CM

## 2020-06-26 DIAGNOSIS — R2681 Unsteadiness on feet: Secondary | ICD-10-CM | POA: Diagnosis not present

## 2020-06-26 NOTE — Therapy (Signed)
[place] MAIN [facility] SERVICES [address] [place], [place], [address] Phone: [phone]   Fax:  [phone]  Physical Therapy Treatment  Patient Details  Name: [place] Richards MRN: [id number] Date of Birth: 03/31/1951 Referring Provider (PT): Dr. Caryl Bis, Angela Adam   Encounter Date: 06/26/2020   PT End of Session - 06/26/20 0755    Visit Number 19    Number of Visits 26    Date for PT Re-Evaluation 07/17/20    Authorization Type 9/10 PN 05/22/20    Authorization Time Period PT FOTO    PT Start Time 0800    PT Stop Time 0844    PT Time Calculation (min) 44 min    Equipment Utilized During Treatment Gait belt    Activity Tolerance Patient tolerated treatment well;No increased pain    Behavior During Therapy WFL for tasks assessed/performed           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   Subjective Assessment - 06/26/20 0801    Subjective Patient denies falls or LOB since last session. Patient enters session without rollator saying that his balance feels better. 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 Patient has a PMH of arthritis, ataxia, CKD (one working kidney), colonic polyp, hyperlipidemia, migraine, pre-diabetes, total shoulder arthroplasty (2019). Patient presents with his sister who  reports he is very active, walks from trailer to [employer], gardens, Social research officer, government. Patient lives alone but sister lives down the road from him. Per patient report he has a hard time slowing down, falls frequently but is able to catch himself most of the time. At home he furniture surfs but recently got a rollator. Reports no dizziness. Symptoms began in 2015 and progressively worsened.    Limitations Lifting;Standing;Walking;House hold activities    How long can you sit comfortably? n/a    How long can you stand comfortably? a few minutes    How long can you walk comfortably? pain in hip limits him    Patient Stated Goals to get stronger and decrease falls    Currently in Pain? No/denies              Treatment:  NuStep 4 minutes, level 5 for BUE and BLE coordination. RPM >60 for cardiovascular challenge.   Standing in // Bars: 4 step with Airex on top: step ups 10x each leg. Patient demonstrates posterior displacement leading to near LOB x 5, stabilizing using ankle and hip strategies.   Dynadisc step over: Patient keeps one foot on dynadisc while other foot steps forward and backward. Fingertips on bar for stability when acclimating to task. 12x each leg.     Assessed goals:  DGI: 16/24 6MWT: 1239f = 371.981mPatient notes discomfort in R hip (3/10) after  6MWT. As patient fatigued during test, decreased knee flexion and increased hip circumduction noted bilaterally.                       PT Education - 06/26/20 0754    Education Details body mechanics, exercise technique, goals    Person(s) Educated Patient    Methods Explanation;Demonstration;Tactile cues;Verbal cues    Comprehension Verbalized understanding;Returned demonstration;Verbal cues required;Tactile cues required            PT Short Term Goals - 05/22/20 0818      PT SHORT TERM GOAL #1   Title Patient will be independent in home exercise program to improve strength/mobility for better functional  independence with ADLs.    Baseline 7/19: HEP given 8/25: HEP compliant    Time 2    Period Weeks    Status Achieved    Target Date 04/29/20             PT Long Term Goals - 06/26/20 0854      PT LONG TERM GOAL #1   Title Patient will increase FOTO score to equal to or greater than  51%   to demonstrate statistically significant improvement in mobility and quality of life.    Baseline 7/19: 29% 8/25: 62.8%    Time 8    Period Weeks    Status Achieved      PT LONG TERM GOAL #2   Title Patient will demonstrate an improved Berg Balance Score of > 33/56  as to demonstrate improved balance with ADLs such as sitting/standing and transfer balance and reduced fall risk.    Baseline 7/19: 27/56 8/25: 42/56    Time 8    Period Weeks    Status Achieved      PT LONG TERM GOAL #3   Title Patient will increase BLE gross strength to 4+/5 as to improve functional strength for independent gait, increased standing tolerance and increased ADL ability.    Baseline 7/19: see note 8/25: R knee flexion, df/pf 4-/5 ; L pf 4/5; rest 4+/5    Time 8    Period Weeks    Status Partially Met    Target Date 07/17/20      PT LONG TERM GOAL #4   Title Patient will deny any falls over past 4 weeks to demonstrate improved safety awareness at home and in the community.    Baseline 7/19: falls every day /25: no falls in past month    Time 8    Period Weeks    Status Achieved      PT LONG TERM GOAL #5   Title Patient will increase dynamic gait index score to >19/24 as to demonstrate reduced fall risk and improved dynamic gait balance for better safety with community/home ambulation.    Baseline 8/25: 10/24, 9/29: 16/24    Time 8    Period Weeks    Status On-going    Target Date 07/17/20      PT LONG TERM GOAL #6   Title Patient will increase six minute walk test distance to >1500 for progression towards age norm community ambulator and improve gait ability with decreased episodes of LOB.    Baseline  8/25: 1125 with 4 near falls requiring mod A to return to [facility], 9/29: [age]f with LOB x 2 pt able to self-stabilize    Time 8    Period Weeks    Status New    Target Date 07/17/20  Plan - 06/26/20 0858    Clinical Impression Statement Patient demonstrates improved dynamic balance, as seen in improvement of DGI score. Patient continues to show difficulty with walking in diagonal patterns, quick turns, and eccentric control. Pt will continue to benefit from skilled PT to improve strength, stability, and coordination for improved mobility and decreased fall risk.    Personal Factors and Comorbidities Comorbidity 3+;Finances;Past/Current Experience;Time since onset of injury/illness/exacerbation;Transportation    Comorbidities arthritis, ataxia, CKD (one working kidney), colonic polyp, hyperlipidemia, migraine, pre-diabetes, total shoulder arthroplasty (2019)    Examination-Activity Limitations Bathing;Bend;Caring for Others;Carry;Dressing;Hygiene/Grooming;Stairs;Squat;Reach Overhead;Locomotion Level;Lift;Stand;Toileting;Transfers    Examination-Participation Restrictions Church;Cleaning;Community Activity;Driving;Laundry;Volunteer;Shop;Meal Prep;Yard Work    Merchant [employer] officer Evolving/Moderate complexity    Rehab Potential Fair    PT Frequency 2x / week    PT Duration 8 weeks    PT Treatment/Interventions ADLs/Self Care Home Management;Aquatic Therapy;Biofeedback;Cryotherapy;Canalith Repostioning;Electrical Stimulation;Iontophoresis 47m/ml Dexamethasone;Moist Heat;Traction;Ultrasound;Therapeutic activities;Functional mobility training;Stair training;Gait training;DME Instruction;Therapeutic exercise;Balance training;Neuromuscular re-education;Patient/family education;Manual techniques;Passive range of motion;Dry needling;Energy conservation;Visual/perceptual remediation/compensation;Vestibular    PT Next Visit Plan frontal plane balance, coordination, reactive  balance    PT Home Exercise Plan no updates this session    Consulted and Agree with Plan of Care Patient           Patient will benefit from skilled therapeutic intervention in order to improve the following deficits and impairments:  Abnormal gait, Decreased activity tolerance, Decreased balance, Decreased knowledge of precautions, Decreased endurance, Decreased coordination, Decreased cognition, Decreased knowledge of use of DME, Decreased mobility, Decreased safety awareness, Difficulty walking, Decreased strength, Impaired perceived functional ability, Postural dysfunction, Improper body mechanics  Visit Diagnosis: Unsteadiness on feet  Muscle weakness (generalized)  Other abnormalities of gait and mobility     Problem List Patient Active Problem List   Diagnosis Date Noted   GERD (gastroesophageal reflux disease) 05/13/2018   Dep[place], major, [place] episode, mild [place] 05/13/2018   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[place] 01/04/2018   Rotator cuff tear arthropathy, right 11/10/2017   Decreased pedal pulses 11/01/2017   Gait abnormality 070/76/1518  Umbilical hernia without obstruction and without gangrene 09/13/2017   Di[place] ([place] 07/30/2017   Hyperlipidemia 07/30/2017   Ataxia 07/30/2017   Right shoulder pain 07/30/2017   Chronic neck pain 07/30/2017   Hypertension 07/30/2017   KTonny Bollman SPT  This entire session was performed under direct supervision and direction of a licensed therapist/therapist assistant . I have personally read, edited and approve of the note as written.  MJanna Arch PT, DPT   06/26/2020, 5:05 PM  [place] [facility] [facility] [address] [address]Phone: [phone]  Fax:  [phone] Name: MSpyros WinchRegister MRN: [id number] of Birth: [birth year]

## 2020-07-01 ENCOUNTER — Encounter: Payer: Self-pay | Admitting: Physical Therapy

## 2020-07-01 ENCOUNTER — Ambulatory Visit: Payer: Medicare Other | Attending: Family Medicine | Admitting: Physical Therapy

## 2020-07-01 ENCOUNTER — Other Ambulatory Visit: Payer: Self-pay

## 2020-07-01 DIAGNOSIS — R2689 Other abnormalities of gait and mobility: Secondary | ICD-10-CM | POA: Diagnosis not present

## 2020-07-01 DIAGNOSIS — R2681 Unsteadiness on feet: Secondary | ICD-10-CM | POA: Diagnosis not present

## 2020-07-01 DIAGNOSIS — M6281 Muscle weakness (generalized): Secondary | ICD-10-CM | POA: Diagnosis not present

## 2020-07-01 NOTE — Therapy (Signed)
[place] MAIN [facility] SERVICES [address] [place], [place], [address] Phone: [phone]   Fax:  [phone]  Physical Therapy Treatment Physical Therapy Progress Note   Dates of reporting period 05/22/20   to 07/01/20  Patient Details  Name: Austin Richards MRN: [id number] Date of Birth: 01/10/1951 Referring Provider (PT): Dr. Caryl Bis, Angela Adam   Encounter Date: 07/01/2020   PT End of Session - 07/01/20 0858    Visit Number 20    Number of Visits 26    Date for PT Re-Evaluation 07/17/20    Authorization Type 9/10 PN 05/22/20    Authorization Time Period PT FOTO    PT Start Time 0850    PT Stop Time 0930    PT Time Calculation (min) 40 min    Equipment Utilized During Treatment Gait belt    Activity Tolerance Patient tolerated treatment well;No increased pain    Behavior During Therapy WFL for tasks assessed/performed           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   Subjective Assessment - 07/01/20 0856    Subjective Patient had a fall and having some right  sided rib pain.    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 Patient has a PMH of arthritis, ataxia, CKD (one working kidney), colonic polyp, hyperlipidemia, migraine, pre-diabetes, total shoulder arthroplasty (2019). Patient presents with his sister who  reports he is very active, walks from trailer to [employer], gardens, Social research officer, government. Patient lives alone but sister lives down the road from him. Per patient report he has a hard time slowing down, falls frequently but is able to catch himself most of the time. At home he furniture surfs but recently got a rollator. Reports no dizziness. Symptoms began in 2015 and progressively worsened.    Limitations Lifting;Standing;Walking;House hold activities    How long can you sit comfortably? n/a    How long can you stand comfortably? a few minutes    How long can you walk comfortably? pain in hip limits him    Patient Stated Goals to get stronger and decrease falls    Currently in Pain? Yes    Pain Score 2     Pain Onset 1 to 4 weeks ago             Neuromuscular Re-education   Tandem stand on step with trunk rotation  without UE support x 20 left side then right side x 20 reps Side stepping on floor without UE support x 2 lengths Heel/toe raises without UE support 2s hold x 10 each Matrix side to side x 5 reps left and right 22.5 lbs  Lateral side steps from foam to 6 inch stool left and right x 15 Backwards stepping from foam to 6 inch  stool x 15  Hurdle steps fwd/bwd, side to side  x 10 ,cues for posture and stepping strategies, occasional LOB      Pt educated throughout session about proper posture and technique with exercises. Improved exercise technique, movement at target joints, use of target muscles after min to mod verbal, visual, tactile cues. CGA and Min to mod verbal cues used throughout with increased in postural sway and LOB most seen with narrow base of support and while on uneven surfaces. Continues to have balance deficits typical with diagnosis                        PT Education - 07/01/20 0857    Education Details posture with exercises and cues for technique    Person(s) Educated Patient    Methods Explanation    Comprehension Returned demonstration;Verbal  cues required;Tactile cues required;Need further instruction            PT Short Term Goals - 05/22/20 0818      PT SHORT TERM GOAL #1   Title Patient will be independent in home exercise program to improve strength/mobility for better functional independence with ADLs.    Baseline 7/19: HEP given 8/25: HEP compliant    Time 2    Period Weeks    Status Achieved    Target Date 04/29/20             PT Long Term Goals - 06/26/20 0854      PT LONG TERM GOAL #1   Title Patient will increase FOTO score to equal to or greater than  51%   to demonstrate statistically significant improvement in mobility and quality of life.    Baseline 7/19: 29% 8/25: 62.8%    Time 8    Period Weeks    Status Achieved      PT LONG TERM GOAL #2   Title Patient will demonstrate an improved Berg Balance Score of > 33/56  as to demonstrate improved balance with ADLs such as sitting/standing and transfer balance and reduced fall risk.    Baseline 7/19: 27/56 8/25: 42/56    Time 8    Period Weeks    Status Achieved      PT LONG TERM GOAL #3   Title Patient will increase BLE gross strength to 4+/5 as to improve functional strength for independent gait, increased standing tolerance and increased ADL ability.    Baseline 7/19: see note 8/25: R knee flexion, df/pf 4-/5 ; L pf 4/5; rest 4+/5    Time 8    Period Weeks    Status Partially Met    Target Date 07/17/20      PT LONG TERM GOAL #4   Title Patient will deny any falls over past 4 weeks to demonstrate improved safety awareness at home and in the community.    Baseline 7/19: falls every day /25: no falls in past month    Time 8    Period Weeks    Status Achieved      PT LONG TERM GOAL #5   Title Patient will increase dynamic gait index score to >19/24 as to demonstrate reduced fall risk and improved dynamic gait balance for better safety with community/home ambulation.    Baseline 8/25: 10/24, 9/29: 16/24    Time 8    Period Weeks    Status  On-going    Target Date 07/17/20      PT LONG TERM GOAL #6  Title Patient will increase six minute walk test distance to >1500 for progression towards age norm community ambulator and improve gait ability with decreased episodes of LOB.    Baseline 8/25: 1125 with 4 near falls requiring mod A to return to COM, 9/29: 1218f with LOB x 2 pt able to self-stabilize    Time 8    Period Weeks    Status New    Target Date 07/17/20                 Plan - 07/01/20 0858    Clinical Impression Statement Patient's condition has the potential to improve in response to therapy. Maximum improvement is yet to be obtained. The anticipated improvement is attainable and reasonable in a generally predictable time.  Patient reports that he had a fall over the weekend.. His outcome measures improved and his goals were reviewed and progressed. Patient will continue to benefit from skilled PT to improve mobility.    Personal Factors and Comorbidities Comorbidity 3+;Finances;Past/Current Experience;Time since onset of injury/illness/exacerbation;Transportation    Comorbidities arthritis, ataxia, CKD (one working kidney), colonic polyp, hyperlipidemia, migraine, pre-diabetes, total shoulder arthroplasty (2019)    Examination-Activity Limitations Bathing;Bend;Caring for Others;Carry;Dressing;Hygiene/Grooming;Stairs;Squat;Reach Overhead;Locomotion Level;Lift;Stand;Toileting;Transfers    Examination-Participation Restrictions Church;Cleaning;Community Activity;Driving;Laundry;Volunteer;Shop;Meal Prep;Yard Work    SMerchant navy officerEvolving/Moderate complexity    Rehab Potential Fair    PT Frequency 2x / week    PT Duration 8 weeks    PT Treatment/Interventions ADLs/Self Care Home Management;Aquatic Therapy;Biofeedback;Cryotherapy;Canalith Repostioning;Electrical Stimulation;Iontophoresis 457mml Dexamethasone;Moist Heat;Traction;Ultrasound;Therapeutic activities;Functional mobility training;Stair  training;Gait training;DME Instruction;Therapeutic exercise;Balance training;Neuromuscular re-education;Patient/family education;Manual techniques;Passive range of motion;Dry needling;Energy conservation;Visual/perceptual remediation/compensation;Vestibular    PT Next Visit Plan frontal plane balance, coordination, reactive balance    PT Home Exercise Plan no updates this session    Consulted and Agree with Plan of Care Patient           Patient will benefit from skilled therapeutic intervention in order to improve the following deficits and impairments:  Abnormal gait, Decreased activity tolerance, Decreased balance, Decreased knowledge of precautions, Decreased endurance, Decreased coordination, Decreased cognition, Decreased knowledge of use of DME, Decreased mobility, Decreased safety awareness, Difficulty walking, Decreased strength, Impaired perceived functional ability, Postural dysfunction, Improper body mechanics  Visit Diagnosis: Unsteadiness on feet  Muscle weakness (generalized)  Other abnormalities of gait and mobility     Problem List Patient Active Problem List   Diagnosis Date Noted   GERD (gastroesophageal reflux disease) 05/13/2018   Depression, major, single episode, mild ([facility]08/16/2019   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[facility]04/05/2018   Rotator cuff tear arthropathy, right 11/10/2017   Decreased pedal pulses 11/01/2017   Gait abnormality 0181/27/5170 Umbilical hernia without obstruction and without gangrene 09/13/2017   Diabetes ([facility]CE[facility]11/10/2016   Hyperlipidemia 07/30/2017   Ataxia 07/30/2017   Right shoulder pain 07/30/2017   Chronic neck pain 07/30/2017   Hypertension 07/30/2017    MaAlanson PulsPT Richards 07/01/2020, 9:00 AM  [place]one: [phone] Fax:  [phone]: Austin Richards MRN: [id number] of Birth:  [birth year]

## 2020-07-03 ENCOUNTER — Ambulatory Visit: Payer: Medicare Other | Admitting: Physical Therapy

## 2020-07-03 ENCOUNTER — Encounter: Payer: Self-pay | Admitting: Physical Therapy

## 2020-07-03 ENCOUNTER — Other Ambulatory Visit: Payer: Self-pay

## 2020-07-03 DIAGNOSIS — M6281 Muscle weakness (generalized): Secondary | ICD-10-CM | POA: Diagnosis not present

## 2020-07-03 DIAGNOSIS — R2681 Unsteadiness on feet: Secondary | ICD-10-CM

## 2020-07-03 DIAGNOSIS — R2689 Other abnormalities of gait and mobility: Secondary | ICD-10-CM | POA: Diagnosis not present

## 2020-07-03 NOTE — Therapy (Signed)
[place] MAIN [facility] SERVICES [address] [place], [place], [address] Phone: [phone]   Fax:  [phone]  Physical Therapy Treatment  Patient Details  Name: Austin Richards MRN: [id number] Date of Birth: [birth year] Referring Provider (PT): Dr. Caryl Bis, Angela Adam   Encounter Date: 07/03/2020   PT End of Session - 07/03/20 0807    Visit Number 21    Number of Visits 26    Date for PT Re-Evaluation 07/17/20    Authorization Type 9/10 PN 05/22/20    Authorization Time Period PT FOTO    PT Start Time 0802    PT Stop Time 0845    PT Time Calculation (min) 43 min    Equipment Utilized During Treatment Gait belt    Activity Tolerance Patient tolerated treatment well;No increased pain    Behavior During Therapy WFL for tasks assessed/performed           Past Medical History:  Diagnosis Date  . Arthritis   . Ataxia   . 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 Hx of colonic polyp   . Hyperlipidemia   . Migraine   . Pre-diabetes     Past Surgical History:  Procedure Laterality Date  . CHOLECYSTECTOMY    . TOTAL SHOULDER ARTHROPLASTY Right 11/10/2017   Procedure: TOTAL SHOULDER ARTHROPLASTY;  Surgeon: Hiram Gash, MD;  Location: [place];  Service: Orthopedics;  Laterality: Right;    There were no vitals filed for this visit.   Subjective Assessment - 07/03/20 0806    Subjective Patient reports continued right side rib pain after a recent fall. 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 Patient has a PMH of arthritis, ataxia, CKD (one working kidney), colonic polyp, hyperlipidemia, migraine, pre-diabetes, total shoulder arthroplasty (2019). Patient presents with his sister who reports he is very active, walks from trailer to  [employer], gardens, Social research officer, government. Patient lives alone but sister lives down the road from him. Per patient report he has a hard time slowing down, falls frequently but is able to catch himself most of the time. At home he furniture surfs but recently got a rollator. Reports no dizziness. Symptoms began in 2015 and progressively worsened.    Limitations Lifting;Standing;Walking;House hold activities    How long can you sit comfortably? n/a    How long can you stand comfortably? a few minutes    How long can you walk comfortably? pain in hip limits him    Patient Stated Goals to get stronger and decrease falls    Currently in Pain? Yes    Pain Score 4     Pain Location Rib cage    Pain Orientation Right    Pain Descriptors / Indicators Aching    Pain Type Acute pain    Pain Onset In the past 7 days    Pain Frequency Intermittent    Aggravating Factors  worse with movement    Pain Relieving Factors rest    Effect of Pain on Daily Activities decreased activity tolerance;    Multiple Pain Sites No              Neuromuscular Re-education  Resisted walking, 12.5# forward/backward, side/side (4 way) x3 laps each with CGA to min A for safety;  Patient exhibits good eccentric control but did have some difficulty with backward walking;  Airex beam: -side stepping unsupported x3 laps each direction with CGA for safety -tandem gait x6 laps unsupported with min A for safety and cues for neutral weight shift; -tandem stance: ball pass side/side x10 reps each foot in front -facing sideways, feet apart, ball toss x10 reps;   Hurdle steps fwd/bwd, side to side  x 10 reps each with 1-0 rail assist ,cues for posture and stepping strategies, occasional LOB  Standing on airex: -heel raises 2 sec hold x15 reps unsupported -one foot on airex, one foot on 6 inch step:  Unsupported standing 15 sec hold x1 rep  BUE wand flexion x10 reps each foot on step  BUE wand trunk rotation x10 reps each foot on  step;   Patient required min VCs for balance stability, including to increase trunk control for less loss of balance with smaller base of support  Pt educated throughout session about proper posture and technique with exercises. Improved exercise technique, movement at target joints, use of target muscles after min to mod verbal, visual, tactile cues. CGA and Min to mod verbal cues used throughout with increased in postural sway and LOB most seen with narrow base of support and while on uneven surfaces.                          PT Education - 07/03/20 0807    Education Details LE strengthening, balance, HEP    Person(s) Educated Patient    Methods Explanation;Verbal cues    Comprehension Verbalized understanding;Returned demonstration;Verbal cues required;Need further instruction            PT Short Term Goals - 05/22/20 0818      PT SHORT TERM GOAL #1   Title Patient will be independent in home exercise program to improve strength/mobility for better functional independence with ADLs.    Baseline 7/19: HEP given 8/25: HEP compliant    Time 2    Period Weeks    Status Achieved    Target Date 04/29/20             PT Long Term Goals - 06/26/20 0854      PT LONG TERM GOAL #1   Title Patient will increase FOTO score to equal to or greater than  51%   to demonstrate statistically significant improvement in mobility and quality of life.    Baseline 7/19: 29% 8/25: 62.8%    Time 8    Period Weeks    Status Achieved      PT LONG TERM GOAL #2   Title Patient will demonstrate an improved Berg Balance Score of > 33/56  as to demonstrate improved balance with ADLs such as sitting/standing and transfer balance and reduced fall risk.    Baseline 7/19: 27/56 8/25: 42/56    Time 8    Period Weeks    Status Achieved      PT LONG TERM GOAL #3   Title Patient will increase BLE gross strength to 4+/5 as to improve functional strength for independent gait,  increased standing tolerance and increased ADL ability.    Baseline 7/19: see note 8/25: R knee flexion, df/pf 4-/5 ; L pf 4/5; rest 4+/5    Time 8    Period Weeks    Status Partially Met    Target Date 07/17/20      PT LONG TERM GOAL #4   Title  Patient will deny any falls over past 4 weeks to demonstrate improved safety awareness at home and in the community.    Baseline 7/19: falls every day /25: no falls in past month    Time 8    Period Weeks    Status Achieved      PT LONG TERM GOAL #5   Title Patient will increase dynamic gait index score to >19/24 as to demonstrate reduced fall risk and improved dynamic gait balance for better safety with community/home ambulation.    Baseline 8/25: 10/24, 9/29: 16/24    Time 8    Period Weeks    Status On-going    Target Date 07/17/20      PT LONG TERM GOAL #6   Title Patient will increase six minute walk test distance to >1500 for progression towards age norm community ambulator and improve gait ability with decreased episodes of LOB.    Baseline 8/25: 1125 with 4 near falls requiring mod A to return to [facility], 9/29: 1247f with LOB x 2 pt able to self-stabilize    Time 8    Period Weeks    Status New    Target Date 07/17/20                 Plan - 07/03/20 0846    Clinical Impression Statement Patient motivated and participated well within session. He was instructed in advanced balance exercise. he continues to have some deficits especially with lateral loss of balance when standing on compliant surface with narrow base of support. patient does require CGA to min A througout session for safety. Patient also requires min-mod VCs for proper exercise technique. He would benefit from additional skilled PT Intervention to improve strength, balance and mobility;    Personal Factors and Comorbidities Comorbidity 3+;Finances;Past/Current Experience;Time since onset of injury/illness/exacerbation;Transportation    Comorbidities arthritis,  ataxia, CKD (one working kidney), colonic polyp, hyperlipidemia, migraine, pre-diabetes, total shoulder arthroplasty (2019)    Examination-Activity Limitations Bathing;Bend;Caring for Others;Carry;Dressing;Hygiene/Grooming;Stairs;Squat;Reach Overhead;Locomotion Level;Lift;Stand;Toileting;Transfers    Examination-Participation Restrictions Church;Cleaning;Community Activity;Driving;Laundry;Volunteer;Shop;Meal Prep;Yard Work    SMerchant navy officerEvolving/Moderate complexity    Rehab Potential Fair    PT Frequency 2x / week    PT Duration 8 weeks    PT Treatment/Interventions ADLs/Self Care Home Management;Aquatic Therapy;Biofeedback;Cryotherapy;Canalith Repostioning;Electrical Stimulation;Iontophoresis 486mml Dexamethasone;Moist Heat;Traction;Ultrasound;Therapeutic activities;Functional mobility training;Stair training;Gait training;DME Instruction;Therapeutic exercise;Balance training;Neuromuscular re-education;Patient/family education;Manual techniques;Passive range of motion;Dry needling;Energy conservation;Visual/perceptual remediation/compensation;Vestibular    PT Next Visit Plan frontal plane balance, coordination, reactive balance    PT Home Exercise Plan no updates this session    Consulted and Agree with Plan of Care Patient           Patient will benefit from skilled therapeutic intervention in order to improve the following deficits and impairments:  Abnormal gait, Decreased activity tolerance, Decreased balance, Decreased knowledge of precautions, Decreased endurance, Decreased coordination, Decreased cognition, Decreased knowledge of use of DME, Decreased mobility, Decreased safety awareness, Difficulty walking, Decreased strength, Impaired perceived functional ability, Postural dysfunction, Improper body mechanics  Visit Diagnosis: Unsteadiness on feet  Muscle weakness (generalized)  Other abnormalities of gait and mobility     Problem List Patient Active  Problem List   Diagnosis Date Noted  . GERD (gastroesophageal reflux disease) 05/13/2018  . Depression, major, single episode, mild (HCM[place]08/16/2019  . 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HCCountry Club Hills04/05/2018  . Rotator cuff tear arthropathy, right 11/10/2017  . Decreased pedal pulses 11/01/2017  . Gait abnormality 10/21/2017  . Umbilical hernia without obstruction and without gangrene  09/13/2017  . Diabetes ([place]) 07/30/2017  . Hyperlipidemia 07/30/2017  . 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 Right shoulder pain 07/30/2017  . Chronic neck pain 07/30/2017  . Hypertension 07/30/2017    Toby Breithaupt PT, DPT 07/03/2020, 8:47 AM  [place] MAIN [facility] SERVICES [address] [place], [place], [address] Phone: [phone]   Fax:  [phone]  Name: Marbin Olshefski [place] MRN: [id number] Date of Birth: 08/22/1951

## 2020-07-08 ENCOUNTER — Other Ambulatory Visit: Payer: Self-pay

## 2020-07-08 ENCOUNTER — Ambulatory Visit: Payer: Medicare Other | Admitting: Physical Therapy

## 2020-07-08 ENCOUNTER — Encounter: Payer: Self-pay | Admitting: Physical Therapy

## 2020-07-08 DIAGNOSIS — R2689 Other abnormalities of gait and mobility: Secondary | ICD-10-CM

## 2020-07-08 DIAGNOSIS — M6281 Muscle weakness (generalized): Secondary | ICD-10-CM | POA: Diagnosis not present

## 2020-07-08 DIAGNOSIS — R2681 Unsteadiness on feet: Secondary | ICD-10-CM

## 2020-07-08 NOTE — Therapy (Signed)
Austin Richards, Austin Richards, Austin Richards  Physical Therapy Treatment  Patient Details  Name: Austin Richards MRN: [id number] Date of Birth: [birth year] Referring Provider (PT): Dr. Caryl Bis, Angela Adam   Encounter Date: 07/08/2020   PT End of Session - 07/08/20 0822    Visit Number 22    Number of Visits 26    Date for PT Re-Evaluation 07/17/20    Authorization Type 9/10 PN 05/22/20    Authorization Time Period PT FOTO    PT Start Time 0804    PT Stop Time 0845    PT Time Calculation (min) 41 min    Equipment Utilized During Treatment Gait belt    Activity Tolerance Patient tolerated treatment well;No increased pain    Behavior During Therapy WFL for tasks assessed/performed           Past Medical History:  Diagnosis Date  . Arthritis   . Ataxia   . 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 Hx of colonic polyp   . Hyperlipidemia   . Migraine   . Pre-diabetes     Past Surgical History:  Procedure Laterality Date  . CHOLECYSTECTOMY    . TOTAL SHOULDER ARTHROPLASTY Right 11/10/2017   Procedure: TOTAL SHOULDER ARTHROPLASTY;  Surgeon: Hiram Gash, MD;  Location: [place];  Service: Orthopedics;  Laterality: Right;    There were no vitals filed for this visit.   Subjective Assessment - 07/08/20 0820    Subjective Patient reports continued right side rib pain is getting better, but he is scarred now, after a recent fall. 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 Patient has a PMH of arthritis, ataxia, CKD (one working kidney), colonic polyp, hyperlipidemia, migraine, pre-diabetes, total shoulder arthroplasty (2019). Patient presents with his sister who reports  he is very active, walks from trailer to [employer], gardens, Social research officer, government. Patient lives alone but sister lives down the road from him. Per patient report he has a hard time slowing down, falls frequently but is able to catch himself most of the time. At home he furniture surfs but recently got a rollator. Reports no dizziness. Symptoms began in 2015 and progressively worsened.    Limitations Lifting;Standing;Walking;House hold activities    How long can you sit comfortably? n/a    How long can you stand comfortably? a few minutes    How long can you walk comfortably? pain in hip limits him    Patient Stated Goals to get stronger and decrease falls    Currently in Pain? No/denies    Pain Score 0-No pain    Pain Onset In the past 7 days           Ther-ex  Octane fitness, L4  x 5 mins  Hip flexion marches with 2# ankle weights x 10 bilateral; Hip abduction with 2# x 10 bilateral Hip extension with 2# x 10 bilateral Quantum leg press 25# x 20 x 3 sets  Sit to stand without UE support from regular height chair x 10   Lateral step ups to 6 inch stool , 2 lb ankle weights x 20  Lunges to BOSU ball x 15 BLE, 2 lb ankle weights  Heel raises x 15 x 2 sets Step ups to 6-inch stool x 20 , 2 lb ankle weights  Eccentric step downs from 3 inch stool x 10 verbal cues to complete slow and tap heel.  BUE CGA Patient needs occasional verbal cueing to improve posture and cueing to correctly perform exercises slowly, holding at end of range to increase motor firing of desired muscle to encourage fatigue.                           PT Education - 07/08/20 0820    Education Details saftey with mobility    Person(s) Educated Patient    Methods Explanation    Comprehension Returned demonstration;Tactile cues required            PT Short Term Goals - 05/22/20 0818      PT SHORT TERM GOAL #1   Title Patient will be independent in home exercise program to improve strength/mobility for better  functional independence with ADLs.    Baseline 7/19: HEP given 8/25: HEP compliant    Time 2    Period Weeks    Status Achieved    Target Date 04/29/20             PT Long Term Goals - 06/26/20 0854      PT LONG TERM GOAL #1   Title Patient will increase FOTO score to equal to or greater than  51%   to demonstrate statistically significant improvement in mobility and quality of life.    Baseline 7/19: 29% 8/25: 62.8%    Time 8    Period Weeks    Status Achieved      PT LONG TERM GOAL #2   Title Patient will demonstrate an improved Berg Balance Score of > 33/56  as to demonstrate improved balance with ADLs such as sitting/standing and transfer balance and reduced fall risk.    Baseline 7/19: 27/56 8/25: 42/56    Time 8    Period Weeks    Status Achieved      PT LONG TERM GOAL #3   Title Patient will increase BLE gross strength to 4+/5 as to improve functional strength for independent gait, increased standing tolerance and increased ADL ability.    Baseline 7/19: see note 8/25: R knee flexion, df/pf 4-/5 ; L pf 4/5; rest 4+/5    Time 8    Period Weeks    Status Partially Met    Target Date 07/17/20      PT LONG TERM GOAL #4   Title Patient will deny any falls over past 4 weeks to demonstrate improved safety awareness at home and in the community.    Baseline 7/19: falls every day /25: no falls in past month    Time 8    Period Weeks    Status Achieved      PT LONG TERM GOAL #5   Title Patient will increase dynamic gait index score to >19/24 as to demonstrate reduced fall risk and improved dynamic gait balance for better safety with community/home ambulation.    Baseline 8/25: 10/24, 9/29: 16/24    Time 8    Period Weeks    Status On-going    Target Date 07/17/20      PT LONG TERM GOAL #6   Title Patient will increase six minute walk test distance to >1500 for progression towards age norm community ambulator and improve gait ability with decreased episodes of LOB.  Baseline 8/25: 1125 with 4 near falls requiring mod A to return to COM, 9/29: [age]f with LOB x 2 pt able to self-stabilize    Time 8    Period Weeks    Status New    Target Date 07/17/20                 Plan - 07/08/20 09244   Clinical Impression Statement Patient instructed in moderate balance and coordination exercise. Patient required mod VCs and min A for gait to improve weight shift and postural control. Patient requires min VCs to improve ankle stability with mobility. Patients would benefit from additional skilled PT intervention to improve balance/gait safety and reduce fall risk.   Personal Factors and Comorbidities Comorbidity 3+;Finances;Past/Current Experience;Time since onset of injury/illness/exacerbation;Transportation    Comorbidities arthritis, ataxia, CKD (one working kidney), colonic polyp, hyperlipidemia, migraine, pre-diabetes, total shoulder arthroplasty (2019)    Examination-Activity Limitations Bathing;Bend;Caring for Others;Carry;Dressing;Hygiene/Grooming;Stairs;Squat;Reach Overhead;Locomotion Level;Lift;Stand;Toileting;Transfers    Examination-Participation Restrictions Church;Cleaning;Community Activity;Driving;Laundry;Volunteer;Shop;Meal Prep;Yard Work    SMerchant navy officerEvolving/Moderate complexity    Rehab Potential Fair    PT Frequency 2x / week    PT Duration 8 weeks    PT Treatment/Interventions ADLs/Self Care Home Management;Aquatic Therapy;Biofeedback;Cryotherapy;Canalith Repostioning;Electrical Stimulation;Iontophoresis 452mml Dexamethasone;Moist Heat;Traction;Ultrasound;Therapeutic activities;Functional mobility training;Stair training;Gait training;DME Instruction;Therapeutic exercise;Balance training;Neuromuscular re-education;Patient/family education;Manual techniques;Passive range of motion;Dry needling;Energy conservation;Visual/perceptual remediation/compensation;Vestibular    PT Next Visit Plan frontal plane balance,  coordination, reactive balance    PT Home Exercise Plan no updates this session    Consulted and Agree with Plan of Care Patient           Patient will benefit from skilled therapeutic intervention in order to improve the following deficits and impairments:  Abnormal gait, Decreased activity tolerance, Decreased balance, Decreased knowledge of precautions, Decreased endurance, Decreased coordination, Decreased cognition, Decreased knowledge of use of DME, Decreased mobility, Decreased safety awareness, Difficulty walking, Decreased strength, Impaired perceived functional ability, Postural dysfunction, Improper body mechanics  Visit Diagnosis: Unsteadiness on feet  Muscle weakness (generalized)  Other abnormalities of gait and mobility     Problem List Patient Active Problem List   Diagnosis Date Noted  . GERD (gastroesophageal reflux disease) 05/13/2018  . Depression, major, single episode, mild ([facility]08/16/2019  . 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[facility]04/05/2018  . Rotator cuff tear arthropathy, right 11/10/2017  . Decreased pedal pulses 11/01/2017  . Gait abnormality 10/21/2017  . Umbilical hernia without obstruction and without gangrene 09/13/2017  . Diabetes ([facility]11/10/2016  . Hyperlipidemia 07/30/2017  . 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 Right shoulder pain 07/30/2017  . Chronic neck pain 07/30/2017  . Hypertension 07/30/2017    MaAlanson PulsPT DPT 07/08/2020, 8:24 AM  [facility] RE[facility] [address]dArgyleNCAlaska[address]hone: [phone] Fax:  [phone]: MiAleksei Goodlinegister MRN: [id number] of Birth: [birth year]

## 2020-07-10 ENCOUNTER — Ambulatory Visit: Payer: Medicare Other

## 2020-07-10 ENCOUNTER — Other Ambulatory Visit: Payer: Self-pay

## 2020-07-10 DIAGNOSIS — R2689 Other abnormalities of gait and mobility: Secondary | ICD-10-CM

## 2020-07-10 DIAGNOSIS — M6281 Muscle weakness (generalized): Secondary | ICD-10-CM | POA: Diagnosis not present

## 2020-07-10 DIAGNOSIS — R2681 Unsteadiness on feet: Secondary | ICD-10-CM | POA: Diagnosis not present

## 2020-07-10 NOTE — Therapy (Signed)
[place] MAIN [facility] SERVICES [address] [place], [place], [address] Phone: [phone]   Fax:  [phone]  Physical Therapy Treatment  Patient Details  Name: Austin Richards MRN: [id number] Date of Birth: Sep 08, 1951 Referring Provider (PT): Dr. Caryl Bis, Angela Adam   Encounter Date: 07/10/2020   PT End of Session - 07/10/20 0850    Visit Number 23    Number of Visits 26    Date for PT Re-Evaluation 07/17/20    Authorization Type 3/10 PN 05/22/20    Authorization Time Period PT FOTO    PT Start Time 0801    PT Stop Time 0845    PT Time Calculation (min) 44 min    Equipment Utilized During Treatment Gait belt    Activity Tolerance Patient tolerated treatment well;No increased pain    Behavior During Therapy WFL for tasks assessed/performed           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   Subjective Assessment - 07/10/20 0805    Subjective Patient is accompanied by his sister this session. Sister notes that she has been concerned due to fall last week. Patient states he is compliant with HEP. Denies falls or stumbles since last session. Rib pain has since radiated into thoracic spine.    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 Patient has a PMH of arthritis, ataxia, CKD (one working kidney), colonic polyp,  hyperlipidemia, migraine, pre-diabetes, total shoulder arthroplasty (2019). Patient presents with his sister who reports he is very active, walks from trailer to [employer], gardens, Social research officer, government. Patient lives alone but sister lives down the road from him. Per patient report he has a hard time slowing down, falls frequently but is able to catch himself most of the time. At home he furniture surfs but recently got a rollator. Reports no dizziness. Symptoms began in 2015 and progressively worsened.    Limitations Lifting;Standing;Walking;House hold activities    How long can you sit comfortably? n/a    How long can you stand comfortably? a few minutes    How long can you walk comfortably? pain in hip limits him    Patient Stated Goals to get stronger and decrease falls    Currently in Pain? Yes    Pain Score 3     Pain Location Back    Pain Orientation Mid;Right    Pain Descriptors / Indicators Aching    Pain Type Acute pain    Pain Onset In the past 7 days          Treatment:  NuStep level 4, 4 minutes for BLE and BUE coordination. RPM >50 for cardiovascular challenge.  STM to R thoracic paraspinals for reduction of muscular guarding and pain x 4 minutes  Inferior thoracic rib mobilizations grade II-III  x 5 minutes  Facilitated family training session this date, as sister notes increased concern with patient's balance during yard work. PT educates patient and sister on slow execution of movements and segmenting tasks in order to reduce fall risk. PT advises patient to use rollator outdoors for inc stability during yard work. Education provided on location of cerebellar atrophy causing decreased coordination and inability for patient to sustain retention of cues taught in skilled PT.   Patient ambulates on red mat without rollator to challenge balance on uneven surfaces. PT cues for slow execution in order to reduce fall risk. Performed 6x. Patient demonstrates near LOB x 4 d/t dec foot clearance onto  mat.  Progressed to patient picking up cones on red mat to simulate plucking weeds in yard. PT educates on breaking down task into pieces to minimize risk of falls, as sister notes that patient will go to walk before fully standing. Patient picks up 3 cones with no over LOB noted. Patient completes task with intentional segmented movements. Performed 4x   Standing at [employer]: On [employer]ire[employer] Pad: static balance with rainbow ball toss off wall. 2 rounds of 1 minute. CGA and chair behind for safety.  Static balance on Bosu (flat side down). CGA and hands hovering over bar for safety. x30 seconds     Pt educated throughout session about proper posture and technique with exercises. Improved exercise technique, movement at target joints, use of target muscles after min to mod verbal, visual, tactile cues.                         PT Education - 07/10/20 0849    Education Details gait safety, body mechanics, pacing of task, fall reduction    Person(s) Educated Patient;Other (comment)    Methods Explanation;Demonstration;Verbal cues;Tactile cues    Comprehension Verbalized understanding;Tactile cues required;Returned demonstration;Verbal cues required            PT Short Term Goals - 05/22/20 0818      PT SHORT TERM GOAL #1   Title Patient will be independent in home exercise program to improve strength/mobility for better functional independence with ADLs.    Baseline 7/19: HEP given 8/25: HEP compliant    Time 2    Period Weeks    Status Achieved    Target Date 04/29/20             PT Long Term Goals - 06/26/20 0854      PT LONG TERM GOAL #1   Title Patient will increase FOTO score to equal to or greater than  51%   to demonstrate statistically significant improvement in mobility and quality of life.    Baseline 7/19: 29% 8/25: 62.8%    Time 8    Period Weeks    Status Achieved      PT LONG TERM GOAL #2   Title Patient will demonstrate an improved Berg Balance  Score of > 33/56  as to demonstrate improved balance with ADLs such as sitting/standing and transfer balance and reduced fall risk.    Baseline 7/19: 27/56 8/25: 42/56    Time 8    Period Weeks    Status Achieved      PT LONG TERM GOAL #3   Title Patient will increase BLE gross strength to 4+/5 as to improve functional strength for independent gait, increased standing tolerance and increased ADL ability.    Baseline 7/19: see note 8/25: R knee flexion, df/pf 4-/5 ;  L pf 4/5; rest 4+/5    Time 8    Period Weeks    Status Partially Met    Target Date 07/17/20      PT LONG TERM GOAL #4   Title Patient will deny any falls over past 4 weeks to demonstrate improved safety awareness at home and in the community.    Baseline 7/19: falls every day /25: no falls in past month    Time 8    Period Weeks    Status Achieved      PT LONG TERM GOAL #5   Title Patient will increase dynamic gait index score to >19/24 as to demonstrate reduced fall risk and improved dynamic gait balance for better safety with community/home ambulation.    Baseline 8/25: 10/24, 9/29: 16/24    Time 8    Period Weeks    Status On-going    Target Date 07/17/20      PT LONG TERM GOAL #6   Title Patient will increase six minute walk test distance to >1500 for progression towards age norm community ambulator and improve gait ability with decreased episodes of LOB.    Baseline 8/25: 1125 with 4 near falls requiring mod A to return to COM, 9/29: 1245f with LOB x 2 pt able to self-stabilize    Time 8    Period Weeks    Status New    Target Date 07/17/20                 Plan - 07/10/20 08242   Clinical Impression Statement Session focused on safety and balance on uneven surfaces this date. Emphasis made on pacing and breakdown of individual tasks in order to reduce fall risk. Patient and sister demonstrate verbal understanding of PT recommendations. Patient displays improved control during dynamic balance tasks on  compliant surface. Patient would benefit from additional skilled PT intervention to improve balance/gait safety and reduce fall risk.    Personal Factors and Comorbidities Comorbidity 3+;Finances;Past/Current Experience;Time since onset of injury/illness/exacerbation;Transportation    Comorbidities arthritis, ataxia, CKD (one working kidney), colonic polyp, hyperlipidemia, migraine, pre-diabetes, total shoulder arthroplasty (2019)    Examination-Activity Limitations Bathing;Bend;Caring for Others;Carry;Dressing;Hygiene/Grooming;Stairs;Squat;Reach Overhead;Locomotion Level;Lift;Stand;Toileting;Transfers    Examination-Participation Restrictions Church;Cleaning;Community Activity;Driving;Laundry;Volunteer;Shop;Meal Prep;Yard Work    SMerchant navy officerEvolving/Moderate complexity    Rehab Potential Fair    PT Frequency 2x / week    PT Duration 8 weeks    PT Treatment/Interventions ADLs/Self Care Home Management;Aquatic Therapy;Biofeedback;Cryotherapy;Canalith Repostioning;Electrical Stimulation;Iontophoresis 445mml Dexamethasone;Moist Heat;Traction;Ultrasound;Therapeutic activities;Functional mobility training;Stair training;Gait training;DME Instruction;Therapeutic exercise;Balance training;Neuromuscular re-education;Patient/family education;Manual techniques;Passive range of motion;Dry needling;Energy conservation;Visual/perceptual remediation/compensation;Vestibular    PT Next Visit Plan frontal plane balance, coordination, reactive balance    PT Home Exercise Plan no updates this session    Consulted and Agree with Plan of Care Patient           Patient will benefit from skilled therapeutic intervention in order to improve the following deficits and impairments:  Abnormal gait, Decreased activity tolerance, Decreased balance, Decreased knowledge of precautions, Decreased endurance, Decreased coordination, Decreased cognition, Decreased knowledge of use of DME, Decreased mobility,  Decreased safety awareness, Difficulty walking, Decreased strength, Impaired perceived functional ability, Postural dysfunction, Improper body mechanics  Visit Diagnosis: Unsteadiness on feet  Muscle weakness (generalized)  Other abnormalities of gait and mobility     Problem List Patient Active Problem List   Diagnosis Date Noted   GERD (gastroesophageal reflux disease) 05/13/2018   Depression, major, single episode, mild ([facility]08/16/2019  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[place]) 01/04/2018   Rotator cuff tear arthropathy, right 11/10/2017   Decreased pedal pulses 11/01/2017   Gait abnormality 89/38/1017   Umbilical hernia without obstruction and without gangrene 09/13/2017   Diabetes ([place]) 07/30/2017   Hyperlipidemia 07/30/2017   Ataxia 07/30/2017   Right shoulder pain 07/30/2017   Chronic neck pain 07/30/2017   Hypertension 07/30/2017   Tonny Bollman, SPT  This entire session was performed under direct supervision and direction of a licensed therapist/therapist assistant . I have personally read, edited and approve of the note as written.  Janna Arch, PT, DPT   07/10/2020, 9:28 AM  [place] MAIN [facility] SERVICES [address] [place], [place], [address] Phone: [phone]   Fax:  [phone]  Name: Lawrnce Reyez Dorval MRN: [id number] Date of Birth: 1951-08-14

## 2020-07-15 ENCOUNTER — Ambulatory Visit: Payer: Medicare Other

## 2020-07-15 ENCOUNTER — Other Ambulatory Visit: Payer: Self-pay

## 2020-07-15 DIAGNOSIS — R2689 Other abnormalities of gait and mobility: Secondary | ICD-10-CM

## 2020-07-15 DIAGNOSIS — M6281 Muscle weakness (generalized): Secondary | ICD-10-CM

## 2020-07-15 DIAGNOSIS — R2681 Unsteadiness on feet: Secondary | ICD-10-CM

## 2020-07-15 NOTE — Therapy (Signed)
[place] MAIN [facility] SERVICES [address] [place], [place], [address] Phone: [phone]   Fax:  [phone]  Physical Therapy Treatment  Patient Details  Name: Austin Richards MRN: [id number] Date of Birth: 1951-02-10 Referring Provider (PT): Dr. Caryl Bis, Angela Adam   Encounter Date: 07/15/2020   PT End of Session - 07/15/20 0821    Visit Number 24    Number of Visits 26    Date for PT Re-Evaluation 07/17/20    Authorization Type 3/10 PN 05/22/20    Authorization Time Period PT FOTO    PT Start Time 0800    PT Stop Time 0844    PT Time Calculation (min) 44 min    Equipment Utilized During Treatment Gait belt    Activity Tolerance Patient tolerated treatment well;No increased pain    Behavior During Therapy WFL for tasks assessed/performed           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   Subjective Assessment - 07/15/20 0804    Subjective Patient notes that he was able to do yard work this weekend with no near LOB. Patient states that he's feeling slightly off balance this morning, as he has a tendency to lean backwards. Denies falls or LOB since last session.    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 Patient has a PMH of arthritis, ataxia, CKD (one working kidney), colonic polyp, hyperlipidemia, migraine,  pre-diabetes, total shoulder arthroplasty (2019). Patient presents with his sister who reports he is very active, walks from trailer to [employer], gardens, Social research officer, government. Patient lives alone but sister lives down the road from him. Per patient report he has a hard time slowing down, falls frequently but is able to catch himself most of the time. At home he furniture surfs but recently got a rollator. Reports no dizziness. Symptoms began in 2015 and progressively worsened.    Limitations Lifting;Standing;Walking;House hold activities    How long can you sit comfortably? n/a    How long can you stand comfortably? a few minutes    How long can you walk comfortably? pain in hip limits him    Patient Stated Goals to get stronger and decrease falls    Currently in Pain? No/denies    Pain Onset In the past 7 days          Treatment: NuStep level 5, 5 minutes for BLE and BUE coordination  Standing in // Bars: Bosu lateral lunges (flat side down) 12x each direction  Bosu forward lunges (flat side down) 12x each leg. Single UE support on bar to challenge balance.  6" step with airex pad on top: step ups with PT cues for slow execution of task in order to increase control of BLE. No UE support to challenge task. 15x  each leg. Pt demos inc difficulty when leading with LLE, displaying posterior LOB with hip strategy to stabilize.   On Airex Pad: balance with other foot on 6" step   Side stepping over hurdles and cones. PT cues for exaggerated hip flexion to clear obstables. No UE support to challenge balance. Performed 6x in length of bars. No overt LOB noted.  Wobbleboard balance 45sec A/P, 45sec M/L  On Airex Pad: 2 cones in front for patient to tap in front and across midline. Performed 10x each leg with single UE support. Mod-max verbal and tactile cues for correct execution of task.   Pt educated throughout session about proper posture and technique with exercises. Improved exercise technique, movement  at target joints, use of target muscles after min to mod verbal, visual, tactile cues.                  PT Education - 07/15/20 0757    Education Details body mechanics, exercise technique, fall reduction    Person(s) Educated Patient    Methods Explanation;Demonstration;Tactile cues;Verbal cues    Comprehension Tactile cues required;Verbalized understanding;Returned demonstration;Verbal cues required            PT Short Term Goals - 05/22/20 0818      PT SHORT TERM GOAL #1   Title Patient will be independent in home exercise program to improve strength/mobility for better functional independence with ADLs.    Baseline 7/19: HEP given 8/25: HEP compliant    Time 2    Period Weeks    Status Achieved    Target Date 04/29/20             PT Long Term Goals - 06/26/20 0854      PT LONG TERM GOAL #1   Title Patient will increase FOTO score to equal to or greater than  51%   to demonstrate statistically significant improvement in mobility and quality of life.    Baseline 7/19: 29% 8/25: 62.8%    Time 8    Period Weeks    Status Achieved      PT LONG TERM GOAL #2   Title Patient will demonstrate an improved Berg Balance Score of > 33/56  as to demonstrate improved balance with ADLs such as sitting/standing and transfer balance and reduced fall risk.    Baseline 7/19: 27/56 8/25: 42/56    Time 8    Period Weeks    Status Achieved      PT LONG TERM GOAL #3   Title Patient will increase BLE gross strength to 4+/5 as to improve functional strength for independent gait, increased standing tolerance and increased ADL ability.    Baseline 7/19: see note 8/25: R knee flexion, df/pf 4-/5 ; L pf 4/5; rest 4+/5    Time 8    Period Weeks    Status Partially Met    Target Date 07/17/20      PT LONG TERM GOAL #4   Title Patient will deny any falls over past 4 weeks to demonstrate improved safety awareness at home and in the community.    Baseline 7/19: falls every day  /25: no falls in past month    Time 8    Period Weeks    Status Achieved      PT LONG TERM GOAL #5   Title Patient will increase dynamic gait index score to >19/24 as to demonstrate reduced fall risk and improved dynamic gait balance for better safety with community/home ambulation.  Baseline 8/25: 10/24, 9/29: 16/24    Time 8    Period Weeks    Status On-going    Target Date 07/17/20      PT LONG TERM GOAL #6   Title Patient will increase six minute walk test distance to >1500 for progression towards age norm community ambulator and improve gait ability with decreased episodes of LOB.    Baseline 8/25: 1125 with 4 near falls requiring mod A to return to COM, 9/29: 1280f with LOB x 2 pt able to self-stabilize    Time 8    Period Weeks    Status New    Target Date 07/17/20                 Plan - 07/15/20 0859    Clinical Impression Statement Patient demonstrates improvement in stabilizing independently using hip and step strategies after perturbations causing LOB. Patient displays ability to better recognize impulsivity of movement and how it can negatively impact balance. Patient is able to slow down during interventions without PT cues in order to regain balance. Discussed any barriers to discharge this session. Patient notes subjective improvements in balance and wishes to add more dynamic balance tasks to HEP prior to DC. Patient would benefit from additional skilled PT intervention to improve balance/gait safety and reduce fall risk.    Personal Factors and Comorbidities Comorbidity 3+;Finances;Past/Current Experience;Time since onset of injury/illness/exacerbation;Transportation    Comorbidities arthritis, ataxia, CKD (one working kidney), colonic polyp, hyperlipidemia, migraine, pre-diabetes, total shoulder arthroplasty (2019)    Examination-Activity Limitations Bathing;Bend;Caring for Others;Carry;Dressing;Hygiene/Grooming;Stairs;Squat;Reach Overhead;Locomotion  Level;Lift;Stand;Toileting;Transfers    Examination-Participation Restrictions Church;Cleaning;Community Activity;Driving;Laundry;Volunteer;Shop;Meal Prep;Yard Work    SMerchant navy officerEvolving/Moderate complexity    Rehab Potential Fair    PT Frequency 2x / week    PT Duration 8 weeks    PT Treatment/Interventions ADLs/Self Care Home Management;Aquatic Therapy;Biofeedback;Cryotherapy;Canalith Repostioning;Electrical Stimulation;Iontophoresis 422mml Dexamethasone;Moist Heat;Traction;Ultrasound;Therapeutic activities;Functional mobility training;Stair training;Gait training;DME Instruction;Therapeutic exercise;Balance training;Neuromuscular re-education;Patient/family education;Manual techniques;Passive range of motion;Dry needling;Energy conservation;Visual/perceptual remediation/compensation;Vestibular    PT Next Visit Plan frontal plane balance, coordination, reactive balance    PT Home Exercise Plan no updates this session    Consulted and Agree with Plan of Care Patient           Patient will benefit from skilled therapeutic intervention in order to improve the following deficits and impairments:  Abnormal gait, Decreased activity tolerance, Decreased balance, Decreased knowledge of precautions, Decreased endurance, Decreased coordination, Decreased cognition, Decreased knowledge of use of DME, Decreased mobility, Decreased safety awareness, Difficulty walking, Decreased strength, Impaired perceived functional ability, Postural dysfunction, Improper body mechanics  Visit Diagnosis: Unsteadiness on feet  Muscle weakness (generalized)  Other abnormalities of gait and mobility     Problem List Patient Active Problem List   Diagnosis Date Noted   GERD (gastroesophageal reflux disease) 05/13/2018   Depression, major, single episode, mild ([facility]08/16/2019   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[facility]04/05/2018   Rotator cuff tear arthropathy, right 11/10/2017   Decreased pedal  pulses 11/01/2017   Gait abnormality 0157/32/2025 Umbilical hernia without obstruction and without gangrene 09/13/2017   Diabetes ([facility]11/10/2016   Hyperlipidemia 07/30/2017   Ataxia 07/30/2017   Right shoulder pain 07/30/2017   Chronic neck pain 07/30/2017   Hypertension 07/30/2017   KaTonny BollmanSPT  07/15/2020, 2:00 PM  This entire session was performed under the direct supervision of a liscensed physical therapist. I have reviewed and agree with student's findings and recommendations.  2:40 PM, 07/15/20 AlEtta GrandchildPT,  DPT Physical Therapist - [facility]  Outpatient Physical Therapy- [place] MA[facility] SERVICES [address] [place], [place], [address] Phone: [phone]   Fax:  [phone]  Name: Austin Richards MRN: [id number] Date of Birth: 17-Aug-1951

## 2020-07-17 ENCOUNTER — Ambulatory Visit: Payer: Medicare Other

## 2020-07-17 ENCOUNTER — Other Ambulatory Visit: Payer: Self-pay

## 2020-07-17 DIAGNOSIS — R2689 Other abnormalities of gait and mobility: Secondary | ICD-10-CM | POA: Diagnosis not present

## 2020-07-17 DIAGNOSIS — M6281 Muscle weakness (generalized): Secondary | ICD-10-CM

## 2020-07-17 DIAGNOSIS — R2681 Unsteadiness on feet: Secondary | ICD-10-CM

## 2020-07-17 NOTE — Therapy (Signed)
[place] MAIN [facility] SERVICES [address] [place], [place], [address] Phone: [phone]   Fax:  [phone]  Physical Therapy Treatment  Patient Details  Name: Austin Richards MRN: [id number] Date of Birth: 03-14-1951 Referring Provider (PT): Dr. 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   Subjective Assessment - 07/17/20 0804    Subjective Patient is feeling very good today, denying falls or LOB since last session. Patient wishes to discuss d/c this session as he notes major improvments in balance. PT agreeable to discussion due to noted progress.    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 Patient has a PMH of arthritis, ataxia, CKD (one working kidney), colonic polyp, hyperlipidemia, migraine, pre-diabetes, total shoulder arthroplasty (2019). Patient  presents with his sister who reports he is very active, walks from trailer to [employer], gardens, Social research officer, government. Patient lives alone but sister lives down the road from him. Per patient report he has a hard time slowing down, falls frequently but is able to catch himself most of the time. At home he furniture surfs but recently got a rollator. Reports no dizziness. Symptoms began in 2015 and progressively worsened.    Limitations Lifting;Standing;Walking;House hold activities    How long can you sit comfortably? n/a    How long can you stand comfortably? a few minutes    How long can you walk comfortably? pain in hip limits him    Patient Stated Goals to get stronger and decrease falls    Currently in Pain? No/denies    Pain Onset In the past 7 days          Treatment: NuStep level 5, 4 minutes for BLE and BUE coordination. RPM >60 for cardiovascular challenge  Agility ladder: forward both feet in each square 4x, backward both feet in each square 4x, side stepping into/out of squares 4x. Speed of execution increased to challenge coordination. Patient demonstrates inc difficulty with backward stepping, near LOB x 3 noted d/t dec foot clearance and retropulsion.  Bosu balance (flat side up) 30 seconds x 2 bouts. CGA for safety  Side stepping on foam balance beam with rainbow ball chest  passes. CGA for safety. 4x length of bars. PT cues for patient to gain balance before catching/passing ball.  Patient demonstrated frequent posterior LOB requiring assistance/UE support to return to [facility]  Tandem walking on foam balance beam. 6x length of bars, occasional use of UE's to re-stabilize and return to COM.   Ambulation over red mat with cone pickups. PT cues for breakdown of task to reduce fall risk. Performed 3x. Patient demonstrates slight squat with hip hinge to pick up cone. PT educates on squatting while maintaining neutral spine when picking objects up off the floor. Patient performs 3 additional cone pickups  on compliant surface with no overt LOB.    Pt educated throughout session about proper posture and technique with exercises. Improved exercise technique, movement at target joints, use of target muscles after min to mod verbal, visual, tactile cues                        PT Education - 07/17/20 0749    Education Details exercise technique, body mechanics    Person(s) Educated Patient    Methods Explanation;Demonstration;Tactile cues;Verbal cues    Comprehension Tactile cues required;Verbalized understanding;Returned demonstration;Verbal cues required            PT Short Term Goals - 05/22/20 0818      PT SHORT TERM GOAL #1   Title Patient will be independent in home exercise program to improve strength/mobility for better functional independence with ADLs.    Baseline 7/19: HEP given 8/25: HEP compliant    Time 2    Period Weeks    Status Achieved    Target Date 04/29/20             PT Long Term Goals - 06/26/20 0854      PT LONG TERM GOAL #1   Title Patient will increase FOTO score to equal to or greater than  51%   to demonstrate statistically significant improvement in mobility and quality of life.    Baseline 7/19: 29% 8/25: 62.8%    Time 8    Period Weeks    Status Achieved      PT LONG TERM GOAL #2   Title Patient will demonstrate an improved Berg Balance Score of > 33/56  as to demonstrate improved balance with ADLs such as sitting/standing and transfer balance and reduced fall risk.    Baseline 7/19: 27/56 8/25: 42/56    Time 8    Period Weeks    Status Achieved      PT LONG TERM GOAL #3   Title Patient will increase BLE gross strength to 4+/5 as to improve functional strength for independent gait, increased standing tolerance and increased ADL ability.    Baseline 7/19: see note 8/25: R knee flexion, df/pf 4-/5 ; L pf 4/5; rest 4+/5    Time 8    Period Weeks    Status Partially Met    Target Date 07/17/20      PT LONG TERM GOAL #4    Title Patient will deny any falls over past 4 weeks to demonstrate improved safety awareness at home and in the community.    Baseline 7/19: falls every day /25: no falls in past month    Time 8    Period Weeks    Status Achieved      PT LONG TERM GOAL #5   Title Patient will increase dynamic gait index score to >19/24 as to demonstrate reduced fall risk  and improved dynamic gait balance for better safety with community/home ambulation.   ° Baseline 8/25: 10/24, 9/29: 16/24   ° Time 8   ° Period Weeks   ° Status On-going   ° Target Date 07/17/20   °  ° PT LONG TERM GOAL #6  ° Title Patient will increase six minute walk test distance to >1500 for progression towards age norm community ambulator and improve gait ability with decreased episodes of LOB.   ° Baseline 8/25: 1125 with 4 near falls requiring mod A to return to COM, 9/29: 1220ft with LOB x 2 pt able to self-stabilize   ° Time 8   ° Period Weeks   ° Status New   ° Target Date 07/17/20   °  °  °  ° ° ° ° ° ° ° ° Plan - 07/17/20 0855   ° Clinical Impression Statement Patient displays improved balance this session seen via performance on Bosu ball. Patient continues to demonstrate impulsivity of movement, specifically after mis-stepping during an exercise and increases speed of task to compensate, leading to further LOB. Patient requires cues to breakdown components of task in order to minimize risk of falls. Patient advised to bring HEP next session for review prior to d/c. Patient would benefit from additional skilled PT intervention to improve balance/gait safety and reduce fall risk.   ° Personal Factors and Comorbidities Comorbidity 3+;Finances;Past/Current Experience;Time since onset of injury/illness/exacerbation;Transportation   ° Comorbidities arthritis, ataxia, CKD (one working kidney), colonic polyp, hyperlipidemia, migraine, pre-diabetes, total shoulder arthroplasty (2019)   ° Examination-Activity Limitations Bathing;Bend;Caring for  Others;Carry;Dressing;Hygiene/Grooming;Stairs;Squat;Reach Overhead;Locomotion Level;Lift;Stand;Toileting;Transfers   ° Examination-Participation Restrictions Church;Cleaning;Community Activity;Driving;Laundry;Volunteer;Shop;Meal Prep;Yard Work   ° Stability/Clinical Decision Making Evolving/Moderate complexity   ° Rehab Potential Fair   ° PT Frequency 2x / week   ° PT Duration 8 weeks   ° PT Treatment/Interventions ADLs/Self Care Home Management;Aquatic Therapy;Biofeedback;Cryotherapy;Canalith Repostioning;Electrical Stimulation;Iontophoresis 4mg/ml Dexamethasone;Moist Heat;Traction;Ultrasound;Therapeutic activities;Functional mobility training;Stair training;Gait training;DME Instruction;Therapeutic exercise;Balance training;Neuromuscular re-education;Patient/family education;Manual techniques;Passive range of motion;Dry needling;Energy conservation;Visual/perceptual remediation/compensation;Vestibular   ° PT Next Visit Plan frontal plane balance, coordination, reactive balance   ° PT Home Exercise Plan no updates this session   ° Consulted and Agree with Plan of Care Patient   °  °  °  ° ° °Patient will benefit from skilled therapeutic intervention in order to improve the following deficits and impairments:  Abnormal gait, Decreased activity tolerance, Decreased balance, Decreased knowledge of precautions, Decreased endurance, Decreased coordination, Decreased cognition, Decreased knowledge of use of DME, Decreased mobility, Decreased safety awareness, Difficulty walking, Decreased strength, Impaired perceived functional ability, Postural dysfunction, Improper body mechanics ° °Visit Diagnosis: °Unsteadiness on feet ° °Muscle weakness (generalized) ° °Other abnormalities of gait and mobility ° ° ° ° °Problem List °Patient Active Problem List  ° Diagnosis Date Noted  °• GERD (gastroesophageal reflux disease) 05/13/2018  °• Depression, major, single episode, mild (HCC) 05/13/2018  °• 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HCC)  01/04/2018  °• Rotator cuff tear arthropathy, right 11/10/2017  °• Decreased pedal pulses 11/01/2017  °• Gait abnormality 10/21/2017  °• Umbilical hernia without obstruction and without gangrene 09/13/2017  °• Diabetes (HCC) 07/30/2017  °• Hyperlipidemia 07/30/2017  °• Ataxia 07/30/2017  °• Right shoulder pain 07/30/2017  °• Chronic neck pain 07/30/2017  °• Hypertension 07/30/2017  ° °Austin Richards, Austin Richards ° °This entire session was performed under direct supervision and direction of a licensed therapist/therapist assistant . I have personally read, edited and approve of the note as written. ° °Austin Richards, PT,   DPT ° ° °07/17/2020, 8:55 AM ° °[place] °[facility] °[address] °[place], [place], [address] °Phone: [phone]   Fax:  [phone] ° °Name: Taje Jerome Lozon °MRN: [id number] °Date of Birth: 06/29/1951 ° ° °

## 2020-07-22 ENCOUNTER — Ambulatory Visit: Payer: Medicare Other

## 2020-07-22 ENCOUNTER — Other Ambulatory Visit: Payer: Self-pay

## 2020-07-22 DIAGNOSIS — R2689 Other abnormalities of gait and mobility: Secondary | ICD-10-CM

## 2020-07-22 DIAGNOSIS — M6281 Muscle weakness (generalized): Secondary | ICD-10-CM | POA: Diagnosis not present

## 2020-07-22 DIAGNOSIS — R2681 Unsteadiness on feet: Secondary | ICD-10-CM

## 2020-07-22 NOTE — Therapy (Signed)
[place] MAIN [facility] SERVICES [address] [place], [place], [address] Phone: [phone]   Fax:  [phone]  Physical Therapy Treatment/ Discharge  Patient Details  Name: Austin Richards MRN: [id number] Date of Birth: 1951/04/29 Referring Provider (PT): Dr. Caryl Bis, Angela Adam   Encounter Date: 07/22/2020   PT End of Session - 07/22/20 0855    Visit Number 26    Number of Visits 26    Date for PT Re-Evaluation 07/22/20    Authorization Type 4/10 PN 05/22/20    PT Start Time 0800    PT Stop Time 0844    PT Time Calculation (min) 44 min    Equipment Utilized During Treatment Gait belt    Activity Tolerance Patient tolerated treatment well;No increased pain    Behavior During Therapy WFL for tasks assessed/performed           Past Medical History:  Diagnosis Date  . Arthritis   . Ataxia   . 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 Hx of colonic polyp   . Hyperlipidemia   . Migraine   . Pre-diabetes     Past Surgical History:  Procedure Laterality Date  . CHOLECYSTECTOMY    . TOTAL SHOULDER ARTHROPLASTY Right 11/10/2017   Procedure: TOTAL SHOULDER ARTHROPLASTY;  Surgeon: Hiram Gash, MD;  Location: [place];  Service: Orthopedics;  Laterality: Right;    There were no vitals filed for this visit.   Subjective Assessment - 07/22/20 0800    Subjective Patient notes feeling tired this morning, as pt drove back from the beach yesterday and couldn't get into his house for a while. Patient denies falls or LOB since last session. 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 Patient has a PMH of arthritis, ataxia, CKD (one working kidney), colonic polyp, hyperlipidemia, migraine, pre-diabetes,  total shoulder arthroplasty (2019). Patient presents with his sister who reports he is very active, walks from trailer to [employer], gardens, Social research officer, government. Patient lives alone but sister lives down the road from him. Per patient report he has a hard time slowing down, falls frequently but is able to catch himself most of the time. At home he furniture surfs but recently got a rollator. Reports no dizziness. Symptoms began in 2015 and progressively worsened.    Limitations Lifting;Standing;Walking;House hold activities    How long can you sit comfortably? n/a    How long can you stand comfortably? a few minutes    How long can you walk comfortably? pain in hip limits him    Patient Stated Goals to get stronger and decrease falls    Currently in Pain? No/denies    Pain Onset In the past 7 days           Treatment: NuStep level 5, 4 minutes for BLE and BUE coordination. RPM >60 for cardiovascular challenge   Goals:  FOTO: 90% DGI: 21/24 6MWT: 1264f without AD. Patient uses caution with turns when feeling unsteady. Patient demonstrates near LOB x 4 at ~10050fand uses step strategy to stabilize. LE MMT: R knee flexion, df/pf 4+/5, L knee flexion, df/pf 4+/5   Access Code: [email or web address]: [email or web address] Date: 07/22/2020  Prepared by: MaJanna Arch  Exercises  Standing Hamstring Curl with Chair Support - 1 x daily - 7 x weekly - 2 sets - 10 reps - 5 hold  Narrow Stance with Counter Support - 1 x daily - 7 x weekly - 2 sets - 2 reps - 30 hold  Standing Hip Extension with Counter Support - 1 x daily - 7 x weekly - 2 sets - 10 reps - 5 hold  Standing Hip Abduction with Counter Support - 1 x daily - 7 x weekly - 2 sets - 10 reps - 5 hold  Standing Eccentric Heel Raise - 1 x daily - 7 x weekly - 2 sets - 10 reps - 5 hold     One time recert for discharge visit.             PT Education - 07/22/20 0752    Education Details HEP, goals, exercise technique, body  mechanics    Person(s) Educated Patient    Methods Explanation;Demonstration;Tactile cues;Verbal cues    Comprehension Verbalized understanding;Tactile cues required;Returned demonstration;Verbal cues required            PT Short Term Goals - 05/22/20 0818      PT SHORT TERM GOAL #1   Title Patient will be independent in home exercise program to improve strength/mobility for better functional independence with ADLs.    Baseline 7/19: HEP given 8/25: HEP compliant    Time 2    Period Weeks    Status Achieved    Target Date 04/29/20             PT Long Term Goals - 07/22/20 0852      PT LONG TERM GOAL #1   Title Patient will increase FOTO score to equal to or greater than  51%   to demonstrate statistically significant improvement in mobility and quality of life.    Baseline 7/19: 29% 8/25: 62.8%, 10/25: 90%    Time 8    Period Weeks    Status Achieved      PT LONG TERM GOAL #2   Title Patient will demonstrate an improved Berg Balance Score of > 33/56  as to demonstrate improved balance with ADLs such as sitting/standing and transfer balance and reduced fall risk.    Baseline 7/19: 27/56 8/25: 42/56    Time 8    Period Weeks    Status Achieved      PT LONG TERM GOAL #3   Title Patient will increase BLE gross strength to 4+/5 as to improve functional strength for independent gait, increased standing tolerance and increased ADL ability.    Baseline 7/19: see note 8/25: R knee flexion, df/pf 4-/5 ; L pf 4/5; rest 4+/5, 10/25:v= R knee flexion, df/pf 4+/5, L knee flexion, df/pf 4+/5    Time 8    Period Weeks    Status Achieved      PT LONG TERM GOAL #4   Title Patient will deny any falls over past 4 weeks to demonstrate improved safety awareness at home and in the community.    Baseline 7/19: falls every day /25: no falls in past month    Time 8    Period Weeks    Status Achieved      PT LONG TERM GOAL #5   Title Patient will increase dynamic gait index score to  >19/24 as to demonstrate reduced fall risk and improved dynamic gait balance for better safety with community/home ambulation.    Baseline 8/25: 10/24, 9/29: 16/24, 10/25:  21/24    Time 8    Period Weeks    Status Achieved      PT LONG TERM GOAL #6   Title Patient will increase six minute walk test distance to >1500 for progression towards age norm community ambulator and improve gait ability with decreased episodes of LOB.    Baseline 8/25: 1125 with 4 near falls requiring mod A to return to [facility], 9/29: 1283f with LOB x 2 pt able to self-stabilize, 10/25: 12072fwithout AD. Near LOB x 4 at ~100065ft able to self stabilize    Time 8    Period Weeks    Status Not Met                 Plan - 07/22/20 0908    Clinical Impression Statement Will be a one time recert for discharge visit. Patient has made good progress in skilled physical therapy in balance and coordination. Patient will be away on vacation starting 08/03/20 and wishes to discharge from physical therapy. Patient has met most goals, evidenced by increased DGI, FOTO, and Berg scores. Patient is able to ambulate without AD and demonstrates improved mindfulness of impulsivity, reducing instances of LOB. Patient verbally acknowledges that he needs to slow down during dynamic activity in order to increase safety with tasks. I would be happy to treat this patient in the future as needed.    Personal Factors and Comorbidities Comorbidity 3+;Finances;Past/Current Experience;Time since onset of injury/illness/exacerbation;Transportation    Comorbidities arthritis, ataxia, CKD (one working kidney), colonic polyp, hyperlipidemia, migraine, pre-diabetes, total shoulder arthroplasty (2019)    Examination-Activity Limitations Bathing;Bend;Caring for Others;Carry;Dressing;Hygiene/Grooming;Stairs;Squat;Reach Overhead;Locomotion Level;Lift;Stand;Toileting;Transfers    Examination-Participation Restrictions Church;Cleaning;Community  Activity;Driving;Laundry;Volunteer;Shop;Meal Prep;Yard Work    Stability/Clinical Decision Making Evolving/Moderate complexity    Rehab Potential Fair    PT Frequency One time visit    PT Duration --    PT Treatment/Interventions ADLs/Self Care Home Management;Aquatic Therapy;Biofeedback;Cryotherapy;Canalith Repostioning;Electrical Stimulation;Iontophoresis 4mg23m Dexamethasone;Moist Heat;Traction;Ultrasound;Therapeutic activities;Functional mobility training;Stair training;Gait training;DME Instruction;Therapeutic exercise;Balance training;Neuromuscular re-education;Patient/family education;Manual techniques;Passive range of motion;Dry needling;Energy conservation;Visual/perceptual remediation/compensation;Vestibular    PT Next Visit Plan frontal plane balance, coordination, reactive balance    PT Home Exercise Plan no updates this session    Consulted and Agree with Plan of Care Patient           Patient will benefit from skilled therapeutic intervention in order to improve the following deficits and impairments:  Abnormal gait, Decreased activity tolerance, Decreased balance, Decreased knowledge of precautions, Decreased endurance, Decreased coordination, Decreased cognition, Decreased knowledge of use of DME, Decreased mobility, Decreased safety awareness, Difficulty walking, Decreased strength, Impaired perceived functional ability, Postural dysfunction, Improper body mechanics  Visit Diagnosis: Unsteadiness on feet  Muscle weakness (generalized)  Other abnormalities of gait and mobility     Problem List Patient Active Problem List   Diagnosis Date Noted  . GERD (gastroesophageal reflux disease) 05/13/2018  . Depression, major, single episode, mild (HCC)[name]/16/2019  . 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HCC)[place]/05/2018  . Rotator cuff tear arthropathy, right 11/10/2017  . Decreased pedal pulses 11/01/2017  . Gait abnormality 10/21/2017  . Umbilical hernia without obstruction and without  gangrene 09/13/2017  . Diabetes (HCC)[place]/10/2016  . Hyperlipidemia 07/30/2017  . 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 Right shoulder pain 07/30/2017  . Chronic neck pain 07/30/2017  . Hypertension 07/30/2017   KathTonny BollmanT  This entire session was performed under direct supervision and direction of a licensed therapist/therapist assistant . I have personally read, edited and approve of the note as  written.  Janna Arch, PT, DPT   07/22/2020, 3:28 PM  [place] MAIN [facility] SERVICES [address] [place], [place], [address] Phone: [phone]   Fax:  [phone]  Name: Austin Richards MRN: [id number] Date of Birth: 06/07/1951

## 2020-07-24 ENCOUNTER — Ambulatory Visit: Payer: Medicare Other

## 2020-08-06 ENCOUNTER — Ambulatory Visit: Payer: Medicare Other | Admitting: Family Medicine

## 2020-08-08 DIAGNOSIS — E119 Type 2 diabetes mellitus without complications: Secondary | ICD-10-CM | POA: Diagnosis not present

## 2020-08-08 LAB — HM DIABETES EYE EXAM

## 2020-08-16 ENCOUNTER — Encounter: Payer: Self-pay | Admitting: Family Medicine

## 2020-08-16 ENCOUNTER — Ambulatory Visit (INDEPENDENT_AMBULATORY_CARE_PROVIDER_SITE_OTHER): Payer: Medicare Other | Admitting: Family Medicine

## 2020-08-16 ENCOUNTER — Other Ambulatory Visit: Payer: Self-pay

## 2020-08-16 VITALS — BP 140/70 | HR 94 | Temp 98.5°F | Ht 68.0 in | Wt 166.0 lb

## 2020-08-16 DIAGNOSIS — E119 Type 2 diabetes mellitus without complications: Secondary | ICD-10-CM

## 2020-08-16 DIAGNOSIS — E782 Mixed hyperlipidemia: Secondary | ICD-10-CM

## 2020-08-16 DIAGNOSIS — K429 Umbilical hernia without obstruction or gangrene: Secondary | ICD-10-CM | POA: Diagnosis not present

## 2020-08-16 DIAGNOSIS — Z794 Long term (current) use of insulin: Secondary | ICD-10-CM

## 2020-08-16 DIAGNOSIS — I10 Essential (primary) hypertension: Secondary | ICD-10-CM | POA: Diagnosis not present

## 2020-08-16 DIAGNOSIS — Z23 Encounter for immunization: Secondary | ICD-10-CM | POA: Diagnosis not present

## 2020-08-16 LAB — LIPID PANEL
Cholesterol: 106 mg/dL (ref 0–200)
HDL: 29.4 mg/dL — ABNORMAL LOW (ref >39.00)
NonHDL: 76.93
Total CHOL/HDL Ratio: 4
Triglycerides: 347 mg/dL — ABNORMAL HIGH (ref 0.0–149.0)
VLDL: 69.4 mg/dL — ABNORMAL HIGH (ref 0.0–40.0)

## 2020-08-16 LAB — COMPREHENSIVE METABOLIC PANEL
ALT: 33 U/L (ref 0–53)
AST: 26 U/L (ref 0–37)
Albumin: 4.5 g/dL (ref 3.5–5.2)
Alkaline Phosphatase: 118 U/L — ABNORMAL HIGH (ref 39–117)
BUN: 25 mg/dL — ABNORMAL HIGH (ref 6–23)
CO2: 26 mEq/L (ref 19–32)
Calcium: 9.6 mg/dL (ref 8.4–10.5)
Chloride: 102 mEq/L (ref 96–112)
Creatinine, Ser: 1.29 mg/dL (ref 0.40–1.50)
GFR: 57.17 mL/min — ABNORMAL LOW (ref >60.00)
Glucose, Bld: 106 mg/dL — ABNORMAL HIGH (ref 70–99)
Potassium: 4.4 mEq/L (ref 3.5–5.1)
Sodium: 138 mEq/L (ref 135–145)
Total Bilirubin: 2 mg/dL — ABNORMAL HIGH (ref 0.2–1.2)
Total Protein: 7 g/dL (ref 6.0–8.3)

## 2020-08-16 LAB — HEMOGLOBIN A1C: Hgb A1c MFr Bld: 6.2 % (ref 4.6–6.5)

## 2020-08-16 LAB — LDL CHOLESTEROL, DIRECT: Direct LDL: 44 mg/dL

## 2020-08-16 NOTE — Patient Instructions (Signed)
Nice to see you. I would encourage you to get the COVID-19 booster when you are ready. We will get lab work today and contact you with the results.

## 2020-08-16 NOTE — Assessment & Plan Note (Signed)
Check lipid panel.  Continue Lipitor 80 mg once daily. 

## 2020-08-16 NOTE — Assessment & Plan Note (Addendum)
The patient notes no issues related to this.  It reduces when he lays down.  He has not had any pain.  He was given hernia return precautions.  He defers surgical evaluation at this time.

## 2020-08-16 NOTE — Progress Notes (Signed)
  Tommi Rumps, MD Phone: [phone]  Austin Richards is a 69 y.o. male who presents today for f/u.  HYPERTENSION  Disease Monitoring  Home BP Monitoring 114-130/60s-70s Chest pain- no    Dyspnea- no Medications  Compliance-  Taking amlodipine.   Edema- no  DIABETES Disease Monitoring: Blood Sugar ranges-120s-145 Polyuria/phagia/dipsia- no      Optho- UTD Medications: Compliance- taking metformin Hypoglycemic symptoms- no     Social History   Tobacco Use  Smoking Status Former Smoker  Smokeless Tobacco Never Used  Tobacco Comment   "smoked in my 53s for a short period of time"     ROS see history of present illness  Objective  Physical Exam Vitals:   08/16/20 1113  BP: 140/70  Pulse: 94  Temp: 98.5 F (36.9 C)  SpO2: 98%    BP Readings from Last 3 Encounters:  08/16/20 140/70  05/01/20 130/70  03/21/20 130/70   Wt Readings from Last 3 Encounters:  08/16/20 166 lb (75.3 kg)  05/01/20 178 lb 6.4 oz (80.9 kg)  03/21/20 176 lb 6.4 oz (80 kg)    Physical Exam Constitutional:      General: He is not in acute distress.    Appearance: He is not diaphoretic.  Cardiovascular:     Rate and Rhythm: Normal rate and regular rhythm.     Heart sounds: Normal heart sounds.  Pulmonary:     Effort: Pulmonary effort is normal.     Breath sounds: Normal breath sounds.  Musculoskeletal:     Right lower leg: No edema.     Left lower leg: No edema.  Skin:    General: Skin is warm and dry.  Neurological:     Mental Status: He is alert.      Assessment/Plan: Please see individual problem list.  Problem List Items Addressed This Visit    Diabetes ([place])    Decent control on home CBGs.  We will continue Metformin 500 mg twice daily.  Check A1c today.      Relevant Orders   HgB A1c   Hyperlipidemia    Check lipid panel.  Continue Lipitor 80 mg once daily.      Relevant Orders   Lipid panel   Hypertension    Well-controlled at home.  He will  continue amlodipine 5 mg daily.  Check labs.      Relevant Orders   Comp Met (CMET)   Umbilical hernia without obstruction and without gangrene    The patient notes no issues related to this.  It reduces when he lays down.  He has not had any pain.  He was given hernia return precautions.  He defers surgical evaluation at this time.       Other Visit Diagnoses    Need for immunization against influenza    -  Primary   Relevant Orders   Flu Vaccine QUAD High Dose(Fluad) (Completed)       Health Maintenance: requesting ophthalmology records.  I encouraged the patient to go ahead and get the COVID-19 booster.   This visit occurred during the SARS-CoV-2 public health emergency.  Safety protocols were in place, including screening questions prior to the visit, additional usage of staff PPE, and extensive cleaning of exam room while observing appropriate contact time as indicated for disinfecting solutions.    Tommi Rumps, MD [place]

## 2020-08-16 NOTE — Assessment & Plan Note (Signed)
Well-controlled at home.  He will continue amlodipine 5 mg daily.  Check labs.

## 2020-08-16 NOTE — Assessment & Plan Note (Signed)
Decent control on home CBGs.  We will continue Metformin 500 mg twice daily.  Check A1c today.

## 2020-08-21 ENCOUNTER — Telehealth: Payer: Self-pay

## 2020-08-26 NOTE — Telephone Encounter (Signed)
Noted. Will review

## 2020-08-27 DIAGNOSIS — U071 COVID-19: Secondary | ICD-10-CM

## 2020-08-27 DIAGNOSIS — Z20822 Contact with and (suspected) exposure to covid-19: Secondary | ICD-10-CM | POA: Diagnosis not present

## 2020-08-27 HISTORY — DX: COVID-19: U07.1

## 2020-08-29 ENCOUNTER — Telehealth: Payer: Self-pay

## 2020-08-29 NOTE — Telephone Encounter (Signed)
Pt tested positive for Covid. Pt was tested at [facility] in [place] and got the results yesterday. Pt is doing ok besides having cold symptoms. He  is taking mucinex, tussinex for cough. He reports having no fever. He said the website told them to let pcp know he tested positive. Pt wants to know if Dr. Caryl Bis has any other suggestions on what he needs to do?

## 2020-08-29 NOTE — Telephone Encounter (Signed)
I called the infusion line and left the patients information to be scheduled for antibody infusion.  Cherre Kothari,cma

## 2020-08-29 NOTE — Telephone Encounter (Signed)
Can you get more details?  When did his symptoms start?  If this started within the last 10 days we could get him referred for the monoclonal antibody infusion.  Would he be interested in this?  He needs to stay quarantined at home until he is at least 10 days from onset of symptoms and has had at least 24 hours of improved symptoms with no fevers.

## 2020-08-29 NOTE — Telephone Encounter (Signed)
  Pt tested positive for Covid. Pt was tested at [facility] in [place] and got the results yesterday. Pt is doing ok besides having cold symptoms. He  is taking mucinex, tussinex for cough. He reports having no fever. He said the website told them to let pcp know he tested positive. Pt wants to know if Dr. Caryl Bis has any other suggestions on what he needs to do? Jarquez Mestre,cma

## 2020-08-29 NOTE — Telephone Encounter (Signed)
Patient and sister both are quarantined together they both were positive and symptoms started on 08/23/2020.  The sister is calling me back in the morning about infusion.  Austin Richards,cma

## 2020-08-30 ENCOUNTER — Other Ambulatory Visit (HOSPITAL_COMMUNITY): Payer: Self-pay | Admitting: Family

## 2020-08-30 DIAGNOSIS — U071 COVID-19: Secondary | ICD-10-CM

## 2020-08-30 NOTE — Progress Notes (Signed)
I connected by phone with Austin Richards on 08/30/2020 at 6:08 PM to discuss the potential use of a new treatment for mild to moderate COVID-19 viral infection in non-hospitalized patients.  This patient is a 69 y.o. male that meets the FDA criteria for Emergency Use Authorization of COVID monoclonal antibody casirivimab/imdevimab, bamlanivimab/eteseviamb, or sotrovimab.  Has a (+) direct SARS-CoV-2 viral test result  Has mild or moderate COVID-19   Is NOT hospitalized due to COVID-19  Is within 10 days of symptom onset  Has at least one of the high risk factor(s) for progression to severe COVID-19 and/or hospitalization as defined in EUA.  Specific high risk criteria : Older age (>/= 68 yo)   Symptoms of stuffy nose, cough, fatigue, aches began 08/23/20.   I have spoken and communicated the following to the patient or parent/caregiver regarding COVID monoclonal antibody treatment:  1. FDA has authorized the emergency use for the treatment of mild to moderate COVID-19 in adults and pediatric patients with positive results of direct SARS-CoV-2 viral testing who are 22 years of age and older weighing at least 40 kg, and who are at high risk for progressing to severe COVID-19 and/or hospitalization.  2. The significant known and potential risks and benefits of COVID monoclonal antibody, and the extent to which such potential risks and benefits are unknown.  3. Information on available alternative treatments and the risks and benefits of those alternatives, including clinical trials.  4. Patients treated with COVID monoclonal antibody should continue to self-isolate and use infection control measures (e.g., wear mask, isolate, social distance, avoid sharing personal items, clean and disinfect high touch surfaces, and frequent handwashing) according to CDC guidelines.   5. The patient or parent/caregiver has the option to accept or refuse COVID monoclonal antibody  treatment.  After reviewing this information with the patient, the patient has agreed to receive one of the available covid 19 monoclonal antibodies and will be provided an appropriate fact sheet prior to infusion. Asencion Gowda, NP 08/30/2020 6:08 PM

## 2020-08-31 ENCOUNTER — Ambulatory Visit (HOSPITAL_COMMUNITY): Payer: Medicare Other

## 2020-08-31 ENCOUNTER — Ambulatory Visit (HOSPITAL_COMMUNITY)
Admission: RE | Admit: 2020-08-31 | Discharge: 2020-08-31 | Disposition: A | Discharge status: Home / Self Care | Payer: Medicare Other | Source: Ambulatory Visit | Attending: Pulmonary Disease | Admitting: Pulmonary Disease

## 2020-08-31 DIAGNOSIS — Z23 Encounter for immunization: Secondary | ICD-10-CM | POA: Insufficient documentation

## 2020-08-31 DIAGNOSIS — U071 COVID-19: Secondary | ICD-10-CM | POA: Diagnosis not present

## 2020-08-31 MED ORDER — METHYLPREDNISOLONE SODIUM SUCC 125 MG IJ SOLR: 125.0000 mg | Freq: Once | INTRAMUSCULAR | Status: DC | PRN

## 2020-08-31 MED ORDER — SODIUM CHLORIDE 0.9 % IV SOLN: Freq: Once | INTRAVENOUS | Status: AC

## 2020-08-31 MED ORDER — ALBUTEROL SULFATE HFA 108 (90 BASE) MCG/ACT IN AERS: 2.0000 | INHALATION_SPRAY | Freq: Once | RESPIRATORY_TRACT | Status: DC | PRN

## 2020-08-31 MED ORDER — FAMOTIDINE IN NACL 20-0.9 MG/50ML-% IV SOLN: 20.0000 mg | Freq: Once | INTRAVENOUS | Status: DC | PRN

## 2020-08-31 MED ORDER — DIPHENHYDRAMINE HCL 50 MG/ML IJ SOLN: 50.0000 mg | Freq: Once | INTRAMUSCULAR | Status: DC | PRN

## 2020-08-31 MED ORDER — SODIUM CHLORIDE 0.9 % IV SOLN: INTRAVENOUS | Status: DC | PRN

## 2020-08-31 MED ORDER — EPINEPHRINE 0.3 MG/0.3ML IJ SOAJ: 0.3000 mg | Freq: Once | INTRAMUSCULAR | Status: DC | PRN

## 2020-08-31 NOTE — Progress Notes (Signed)
Patient reviewed Fact Sheet for Patients, Parents, and Caregivers for Emergency Use Authorization (EUA) of Regen-COV for the Treatment of Coronavirus. Patient also reviewed and is agreeable to the estimated cost of treatment. Patient is agreeable to proceed.

## 2020-08-31 NOTE — Progress Notes (Signed)
  Diagnosis: COVID-19  Physician:Dr Joya Gaskins   Procedure: Covid Infusion Clinic Med: casirivimab\imdevimab infusion - Provided patient with casirivimab\imdevimab fact sheet for patients, parents and caregivers prior to infusion.  Complications: No immediate complications noted.  Discharge: Discharged home   Dewaine Oats 08/31/2020

## 2020-08-31 NOTE — Discharge Instructions (Signed)
10 Things You Can Do to Manage Your COVID-19 Symptoms at Home If you have possible or confirmed COVID-19: 1. Stay home from work and school. And stay away from other public places. If you must go out, avoid using any kind of public transportation, ridesharing, or taxis. 2. Monitor your symptoms carefully. If your symptoms get worse, call your healthcare provider immediately. 3. Get rest and stay hydrated. 4. If you have a medical appointment, call the healthcare provider ahead of time and tell them that you have or may have COVID-19. 5. For medical emergencies, call 911 and notify the dispatch personnel that you have or may have COVID-19. 6. Cover your cough and sneezes with a tissue or use the inside of your elbow. 7. Wash your hands often with soap and water for at least 20 seconds or clean your hands with an alcohol-based hand sanitizer that contains at least 60% alcohol. 8. As much as possible, stay in a specific room and away from other people in your home. Also, you should use a separate bathroom, if available. If you need to be around other people in or outside of the home, wear a mask. 9. Avoid sharing personal items with other people in your household, like dishes, towels, and bedding. 10. Clean all surfaces that are touched often, like counters, tabletops, and doorknobs. Use household cleaning sprays or wipes according to the label instructions. cdc.gov/coronavirus 03/29/2019 This information is not intended to replace advice given to you by your health care provider. Make sure you discuss any questions you have with your health care provider. Document Revised: 08/31/2019 Document Reviewed: 08/31/2019 Elsevier Patient Education  2020 Elsevier Inc. What types of side effects do monoclonal antibody drugs cause?  Common side effects  In general, the more common side effects caused by monoclonal antibody drugs include: . Allergic reactions, such as hives or itching . Flu-like signs and  symptoms, including chills, fatigue, fever, and muscle aches and pains . Nausea, vomiting . Diarrhea . Skin rashes . Low blood pressure   The CDC is recommending patients who receive monoclonal antibody treatments wait at least 90 days before being vaccinated.  Currently, there are no data on the safety and efficacy of mRNA COVID-19 vaccines in persons who received monoclonal antibodies or convalescent plasma as part of COVID-19 treatment. Based on the estimated half-life of such therapies as well as evidence suggesting that reinfection is uncommon in the 90 days after initial infection, vaccination should be deferred for at least 90 days, as a precautionary measure until additional information becomes available, to avoid interference of the antibody treatment with vaccine-induced immune responses. If you have any questions or concerns after the infusion please call the Advanced Practice Provider on call at [phone]. This number is ONLY intended for your use regarding questions or concerns about the infusion post-treatment side-effects.  Please do not provide this number to others for use. For return to work notes please contact your primary care provider.   If someone you know is interested in receiving treatment please have them call the COVID hotline at [phone].   

## 2020-09-16 ENCOUNTER — Other Ambulatory Visit: Payer: Self-pay | Admitting: Family Medicine

## 2020-09-29 ENCOUNTER — Other Ambulatory Visit: Payer: Self-pay | Admitting: Family Medicine

## 2020-09-29 DIAGNOSIS — E782 Mixed hyperlipidemia: Secondary | ICD-10-CM

## 2020-09-30 ENCOUNTER — Other Ambulatory Visit: Payer: Self-pay | Admitting: Family Medicine

## 2020-09-30 ENCOUNTER — Other Ambulatory Visit
Admission: RE | Admit: 2020-09-30 | Discharge: 2020-09-30 | Disposition: A | Discharge status: Home / Self Care | Payer: Medicare Other | Source: Ambulatory Visit | Attending: Pediatrics | Admitting: Pediatrics

## 2020-09-30 ENCOUNTER — Other Ambulatory Visit: Payer: Self-pay

## 2020-09-30 ENCOUNTER — Telehealth: Payer: Self-pay | Admitting: Family Medicine

## 2020-09-30 ENCOUNTER — Telehealth: Payer: Medicare Other | Admitting: Family Medicine

## 2020-09-30 DIAGNOSIS — J22 Unspecified acute lower respiratory infection: Secondary | ICD-10-CM | POA: Diagnosis not present

## 2020-09-30 DIAGNOSIS — U071 COVID-19: Secondary | ICD-10-CM | POA: Insufficient documentation

## 2020-09-30 DIAGNOSIS — R000 Tachycardia, unspecified: Secondary | ICD-10-CM | POA: Diagnosis not present

## 2020-09-30 DIAGNOSIS — J189 Pneumonia, unspecified organism: Secondary | ICD-10-CM | POA: Diagnosis not present

## 2020-09-30 DIAGNOSIS — E119 Type 2 diabetes mellitus without complications: Secondary | ICD-10-CM

## 2020-09-30 DIAGNOSIS — Z794 Long term (current) use of insulin: Secondary | ICD-10-CM

## 2020-09-30 DIAGNOSIS — Z03818 Encounter for observation for suspected exposure to other biological agents ruled out: Secondary | ICD-10-CM | POA: Diagnosis not present

## 2020-09-30 DIAGNOSIS — R059 Cough, unspecified: Secondary | ICD-10-CM | POA: Diagnosis not present

## 2020-09-30 LAB — FIBRIN DERIVATIVES D-DIMER (ARMC ONLY): Fibrin derivatives D-dimer (ARMC): 556.72 ng/mL (FEU) — ABNORMAL HIGH (ref 0.00–499.00)

## 2020-09-30 MED ORDER — ACCU-CHEK GUIDE VI STRP: 1.0000 | ORAL_STRIP | Freq: Every day | 2 refills | Status: DC

## 2020-09-30 NOTE — Telephone Encounter (Signed)
Noted  

## 2020-09-30 NOTE — Telephone Encounter (Signed)
Noted.  Plan for virtual visit later today.

## 2020-09-30 NOTE — Telephone Encounter (Signed)
Pt wife just cancelled his appointment. She said they went to [facility].

## 2020-09-30 NOTE — Telephone Encounter (Signed)
Has wheezing an shortness of breath reported but can speak in full sentences ,alert. just has terrible cough and sounds very congested coughing times 3 days. Patient is afebrile. Covid infusion on 08/30/20, sister is concerned maybe developing pneumonia. Scheduled virtual appointment for 3 today.

## 2020-10-01 NOTE — Telephone Encounter (Signed)
Pt tested positive again for covid. Pt had some cloudiness in lungs. They treated him for mild case of pneumonia. They put him on an albuterol inhaler and 10mg  predinose. She said his sugar was good this morning. He is also on Promethiazine cough syrup for his cough. Pt is doing better today but she wanted Dr. to know all of this.

## 2020-10-01 NOTE — Telephone Encounter (Signed)
Patients sister is aware of COVID protocols and quarantine time.

## 2020-10-01 NOTE — Telephone Encounter (Signed)
Thank you for the update. Please make sure they understand the quarantine protocol for him since he tested positive and for his sister if she has been around him since he has been sick. For him it would be #1 and for her it would be #2.  If you tested positive:  Everyone, regardless of vaccination status.  Stay home for 5 days. If you have no symptoms or your symptoms are resolving after 5 days, you can leave your house. Continue to wear a mask around others for 5 additional days. If you have a fever, continue to stay home until your fever resolves.  2.   If You Were Exposed to Someone with COVID-19 (Quarantine) If you:  Have been boosted OR Completed the primary series of Pfizer or Moderna vaccine within the last 6 months OR Completed the primary series of J&J vaccine within the last 2 months  Wear a mask around others for 10 days. Test on day 5, if possible. If you develop symptoms get a test and stay home.  If you:  Completed the primary series of Pfizer or Moderna vaccine over 6 months ago and are not boosted OR Completed the primary series of J&J over 2 months ago and are not boosted OR Are unvaccinated  Stay home for 5 days. After that continue to wear a mask around others for 5 additional days. If you can't quarantine you must wear a mask for 10 days. Test on day 5 if possible. If you develop symptoms get a test and stay home

## 2020-10-28 ENCOUNTER — Ambulatory Visit (INDEPENDENT_AMBULATORY_CARE_PROVIDER_SITE_OTHER): Payer: Medicare Other

## 2020-10-28 ENCOUNTER — Other Ambulatory Visit: Payer: Self-pay

## 2020-10-28 ENCOUNTER — Ambulatory Visit (INDEPENDENT_AMBULATORY_CARE_PROVIDER_SITE_OTHER): Payer: Medicare Other | Admitting: Family Medicine

## 2020-10-28 ENCOUNTER — Encounter: Payer: Self-pay | Admitting: Family Medicine

## 2020-10-28 VITALS — BP 130/70 | HR 99 | Temp 97.9°F | Ht 68.0 in | Wt 164.4 lb

## 2020-10-28 DIAGNOSIS — Z8701 Personal history of pneumonia (recurrent): Secondary | ICD-10-CM

## 2020-10-28 DIAGNOSIS — Z794 Long term (current) use of insulin: Secondary | ICD-10-CM

## 2020-10-28 DIAGNOSIS — E119 Type 2 diabetes mellitus without complications: Secondary | ICD-10-CM

## 2020-10-28 DIAGNOSIS — Z8616 Personal history of COVID-19: Secondary | ICD-10-CM | POA: Diagnosis not present

## 2020-10-28 DIAGNOSIS — I10 Essential (primary) hypertension: Secondary | ICD-10-CM | POA: Diagnosis not present

## 2020-10-28 DIAGNOSIS — J189 Pneumonia, unspecified organism: Secondary | ICD-10-CM | POA: Diagnosis not present

## 2020-10-28 NOTE — Assessment & Plan Note (Signed)
Patient has recovered quite well.  It is unclear if he had a second infection with COVID-19 or if he had a bacterial pneumonia and his positive Covid test was related to his initial COVID-19 infection.  Regardless he has recovered well.  He does need a follow-up chest x-ray which will be ordered today.

## 2020-10-28 NOTE — Assessment & Plan Note (Signed)
Generally well controlled.  Most recent A1c was adequately controlled.  Will defer rechecking an A1c today.  He will continue Metformin 500 mg twice daily.

## 2020-10-28 NOTE — Assessment & Plan Note (Signed)
Adequate control.  He will continue amlodipine 2.5 mg daily.

## 2020-10-28 NOTE — Progress Notes (Signed)
Tommi Rumps, MD Phone: [phone]  Jubal Rademaker Rather is a 70 y.o. male who presents today for f/u.  DIABETES Disease Monitoring: Blood Sugar ranges-120s-150s Polyuria/phagia/dipsia- no      Optho- UTD Medications: Compliance- taking metformin Hypoglycemic symptoms- no  HYPERTENSION  Disease Monitoring  Home BP Monitoring similar to today Chest pain- no    Dyspnea- no Medications  Compliance-  Taking amlodipine.  Edema- no  History of COVID-19: Patient was diagnosed with COVID-19 back in November.  He recovered from that and then subsequently became ill again in early January.  He was evaluated at the walk-in clinic and tested positive for COVID-19 again though it is unclear if that was a positive test related to his prior infection or a new positive.  He reports he had a negative D-dimer as well.  He did have a chest x-ray revealing concern for pneumonia and he was treated with prednisone and Levaquin.  He feels back to baseline.  He said no fever, cough, or pain.     Social History   Tobacco Use  Smoking Status Former Smoker  Smokeless Tobacco Never Used  Tobacco Comment   "smoked in my 46s for a short period of time"    Current Outpatient Medications on File Prior to Visit  Medication Sig Dispense Refill  . Accu-Chek Softclix Lancets lancets USE AS DIRECTED DAILY 100 each 1  . acetaminophen (TYLENOL) 500 MG tablet Take 500 mg by mouth as needed.    [name] Kitchen amLODipine (NORVASC) 5 MG tablet TAKE 1/2 TABLET BY MOUTH DAILY 45 tablet 3  . atorvastatin (LIPITOR) 80 MG tablet TAKE 1 TABLET BY MOUTH EVERY DAY FOR 6PM 90 tablet 1  . blood glucose meter kit and supplies KIT Dispense based on patient and insurance preference. Use once daily. (FOR ICD-10 E11.9). 1 each 0  . escitalopram (LEXAPRO) 10 MG tablet TAKE 1 TABLET BY MOUTH EVERY DAY 90 tablet 2  . glucose blood (ACCU-CHEK GUIDE) test strip 1 each by Other route daily at 12 noon. Use as instructed 200 strip 2  .  metFORMIN (GLUCOPHAGE) 500 MG tablet Take 1 tablet (500 mg total) by mouth 2 (two) times daily with a meal. 180 tablet 3  . Multiple Vitamins-Minerals (MULTIVITAMIN PO) Take 1 tablet by mouth daily.    [name] Kitchen albuterol (VENTOLIN HFA) 108 (90 Base) MCG/ACT inhaler Inhale into the lungs.    [name] Kitchen levofloxacin (LEVAQUIN) 250 MG tablet Take by mouth.     No current facility-administered medications on file prior to visit.     ROS see history of present illness  Objective  Physical Exam Vitals:   10/28/20 1043  BP: 130/70  Pulse: 99  Temp: 97.9 F (36.6 C)  SpO2: 99%    BP Readings from Last 3 Encounters:  10/28/20 130/70  08/31/20 (!) 148/81  08/16/20 140/70   Wt Readings from Last 3 Encounters:  10/28/20 164 lb 6.4 oz (74.6 kg)  08/16/20 166 lb (75.3 kg)  05/01/20 178 lb 6.4 oz (80.9 kg)    Physical Exam Constitutional:      General: He is not in acute distress.    Appearance: He is not diaphoretic.  Cardiovascular:     Rate and Rhythm: Normal rate and regular rhythm.     Heart sounds: Normal heart sounds.  Pulmonary:     Effort: Pulmonary effort is normal.     Breath sounds: Normal breath sounds.  Musculoskeletal:        General: No edema.  Right lower leg: No edema.     Left lower leg: No edema.  Skin:    General: Skin is warm and dry.  Neurological:     Mental Status: He is alert.      Assessment/Plan: Please see individual problem list.  Problem List Items Addressed This Visit    Diabetes ([place])    Generally well controlled.  Most recent A1c was adequately controlled.  Will defer rechecking an A1c today.  He will continue Metformin 500 mg twice daily.      History of COVID-19    Patient has recovered quite well.  It is unclear if he had a second infection with COVID-19 or if he had a bacterial pneumonia and his positive Covid test was related to his initial COVID-19 infection.  Regardless he has recovered well.  He does need a follow-up chest x-ray which will  be ordered today.      Relevant Orders   DG Chest 2 View   Hypertension    Adequate control.  He will continue amlodipine 2.5 mg daily.       Other Visit Diagnoses    History of pneumonia    -  Primary   Relevant Orders   DG Chest 2 View        This visit occurred during the SARS-CoV-2 public health emergency.  Safety protocols were in place, including screening questions prior to the visit, additional usage of staff PPE, and extensive cleaning of exam room while observing appropriate contact time as indicated for disinfecting solutions.    Tommi Rumps, MD [place]

## 2020-10-28 NOTE — Patient Instructions (Signed)
Nice to see you. We will recheck a chest x-ray today. Please continue to monitor your blood pressure and blood sugars.

## 2020-12-05 ENCOUNTER — Other Ambulatory Visit: Payer: Self-pay | Admitting: Family Medicine

## 2020-12-24 ENCOUNTER — Other Ambulatory Visit: Payer: Self-pay | Admitting: Family Medicine

## 2020-12-24 DIAGNOSIS — R7303 Prediabetes: Secondary | ICD-10-CM

## 2020-12-31 ENCOUNTER — Telehealth: Payer: Self-pay | Admitting: Family Medicine

## 2020-12-31 NOTE — Telephone Encounter (Signed)
Left message for patient to call back and schedule Medicare Annual Wellness Visit (AWV)   Please schedule on the [place] template  No hx of previous AWV. please schedule at anytime.  This should be a 45 minute visit!

## 2021-02-11 ENCOUNTER — Other Ambulatory Visit: Payer: Self-pay

## 2021-02-17 ENCOUNTER — Telehealth: Payer: Self-pay

## 2021-02-17 DIAGNOSIS — Z23 Encounter for immunization: Secondary | ICD-10-CM | POA: Diagnosis not present

## 2021-02-17 NOTE — Telephone Encounter (Signed)
Pt's wife called and states that pt is getting his covid booster today and wants to schedule his pneumonia and meningococcal vaccines in two weeks prior to him going out of town to [facility] on 03/17/21. Please advise. Pt would like this done as soon as possible.

## 2021-02-18 ENCOUNTER — Other Ambulatory Visit: Payer: Self-pay

## 2021-02-18 ENCOUNTER — Ambulatory Visit (INDEPENDENT_AMBULATORY_CARE_PROVIDER_SITE_OTHER): Payer: Medicare Other | Admitting: Family Medicine

## 2021-02-18 ENCOUNTER — Encounter: Payer: Self-pay | Admitting: Family Medicine

## 2021-02-18 VITALS — BP 130/70 | HR 80 | Temp 98.1°F | Ht 68.0 in | Wt 164.6 lb

## 2021-02-18 DIAGNOSIS — E119 Type 2 diabetes mellitus without complications: Secondary | ICD-10-CM | POA: Diagnosis not present

## 2021-02-18 DIAGNOSIS — Z7185 Encounter for immunization safety counseling: Secondary | ICD-10-CM | POA: Diagnosis not present

## 2021-02-18 DIAGNOSIS — I10 Essential (primary) hypertension: Secondary | ICD-10-CM | POA: Diagnosis not present

## 2021-02-18 DIAGNOSIS — Z23 Encounter for immunization: Secondary | ICD-10-CM

## 2021-02-18 DIAGNOSIS — M25552 Pain in left hip: Secondary | ICD-10-CM | POA: Insufficient documentation

## 2021-02-18 DIAGNOSIS — Z794 Long term (current) use of insulin: Secondary | ICD-10-CM

## 2021-02-18 LAB — POCT GLYCOSYLATED HEMOGLOBIN (HGB A1C): Hemoglobin A1C: 5.9 % — AB (ref 4.0–5.6)

## 2021-02-18 NOTE — Assessment & Plan Note (Signed)
Check A1c.  Continue metformin 500 mg twice daily. 

## 2021-02-18 NOTE — Assessment & Plan Note (Signed)
Adequate control.  He will continue amlodipine 5 mg once daily.

## 2021-02-18 NOTE — Assessment & Plan Note (Signed)
Possibly arthritic related.  Encouraged him to continue with his exercises and walking.  Offered referral to orthopedics though he declines.

## 2021-02-18 NOTE — Patient Instructions (Signed)
Nice to see you. We will let you know what your A1c result is.

## 2021-02-18 NOTE — Telephone Encounter (Signed)
The patient was seen today.  Discussed during his visit.

## 2021-02-18 NOTE — Assessment & Plan Note (Addendum)
I counseled the patient on the ongoing outbreak of meningococcal disease in [place].  The patient is a man who has sex with other men and meets criteria for meningococcal vaccine given his upcoming travel to [place].  I did discuss the potential that his insurance may not cover this and he is still opted to proceed with this.  He will also be given a Pneumovax.

## 2021-02-18 NOTE — Progress Notes (Signed)
Austin Rumps, MD Phone: [phone]  Austin Richards is a 70 y.o. male who presents today for f/u.  HYPERTENSION  Disease Monitoring  Home BP Monitoring unable to provide numbers Chest pain- no    Dyspnea- no Medications  Compliance-  Taking amlodipine.   DIABETES Disease Monitoring: Blood Sugar ranges-118-140s Polyuria/phagia/dipsia-patient notices symptoms more when his sugar runs up into the 140s, this typically occurs at night      Optho- UTD Medications: Compliance- taking metformin Hypoglycemic symptoms- no  Chronic left hip pain: Notes this is posterior.  He has seen physical therapy and has been doing exercises.  He walks a lot to try to help with this.  He notes his prior PCP gave him a steroid injection in the area.  He has not seen orthopedics.  The patient reports he is traveling to [place] in about a month.  He is requesting a meningitis vaccine.  Based on review of CDC guidelines this is recommended for gay, bisexual, and other men who have sex with men who live in [place] and is recommended for men who are traveling to [place] discussed with her PCP.   Social History   Tobacco Use  Smoking Status Former Smoker  Smokeless Tobacco Never Used  Tobacco Comment   "smoked in my 68s for a short period of time"    Current Outpatient Medications on File Prior to Visit  Medication Sig Dispense Refill  . Accu-Chek Softclix Lancets lancets USE AS DIRECTED 100 each 1  . acetaminophen (TYLENOL) 500 MG tablet Take 500 mg by mouth as needed.    [name] Kitchen albuterol (VENTOLIN HFA) 108 (90 Base) MCG/ACT inhaler Inhale into the lungs.    [name] Kitchen amLODipine (NORVASC) 5 MG tablet TAKE 1/2 TABLET BY MOUTH DAILY 45 tablet 3  . atorvastatin (LIPITOR) 80 MG tablet TAKE 1 TABLET BY MOUTH EVERY DAY FOR 6PM 90 tablet 1  . blood glucose meter kit and supplies KIT Dispense based on patient and insurance preference. Use once daily. (FOR ICD-10 E11.9). 1 each 0  . escitalopram (LEXAPRO) 10 MG  tablet TAKE 1 TABLET BY MOUTH EVERY DAY 90 tablet 2  . glucose blood (ACCU-CHEK GUIDE) test strip 1 each by Other route daily at 12 noon. Use as instructed 200 strip 2  . levofloxacin (LEVAQUIN) 250 MG tablet Take by mouth.    . metFORMIN (GLUCOPHAGE) 500 MG tablet TAKE 1 TABLET (500 MG TOTAL) BY MOUTH 2 (TWO) TIMES DAILY WITH A MEAL. 180 tablet 3  . Multiple Vitamins-Minerals (MULTIVITAMIN PO) Take 1 tablet by mouth daily.     No current facility-administered medications on file prior to visit.     ROS see history of present illness  Objective  Physical Exam Vitals:   02/18/21 1013  BP: 130/70  Pulse: 80  Temp: 98.1 F (36.7 C)  SpO2: 99%    BP Readings from Last 3 Encounters:  02/18/21 130/70  10/28/20 130/70  08/31/20 (!) 148/81   Wt Readings from Last 3 Encounters:  02/18/21 164 lb 9.6 oz (74.7 kg)  10/28/20 164 lb 6.4 oz (74.6 kg)  08/16/20 166 lb (75.3 kg)    Physical Exam Constitutional:      General: He is not in acute distress.    Appearance: He is not diaphoretic.  Cardiovascular:     Rate and Rhythm: Normal rate and regular rhythm.     Heart sounds: Normal heart sounds.  Pulmonary:     Effort: Pulmonary effort is normal.     Breath  sounds: Normal breath sounds.  Musculoskeletal:     Right lower leg: No edema.     Left lower leg: No edema.     Comments: Slight decreased internal range of motion left hip with discomfort in his posterior hip, otherwise good range of motion bilateral hips with no discomfort  Skin:    General: Skin is warm and dry.  Neurological:     Mental Status: He is alert.      Assessment/Plan: Please see individual problem list.  Problem List Items Addressed This Visit    Diabetes ([place]) - Primary    Check A1c.  Continue metformin 500 mg twice daily.      Relevant Orders   POCT HgB A1C (Completed)   Hypertension    Adequate control.  He will continue amlodipine 5 mg once daily.      Left hip pain    Possibly arthritic  related.  Encouraged him to continue with his exercises and walking.  Offered referral to orthopedics though he declines.      Vaccine counseling    I counseled the patient on the ongoing outbreak of meningococcal disease in [place].  The patient is a man who has sex with other men and meets criteria for meningococcal vaccine given his upcoming travel to [place].  I did discuss the potential that his insurance may not cover this and he is still opted to proceed with this.  He will also be given a Pneumovax.       Other Visit Diagnoses    Need for meningitis vaccination       Relevant Orders   Meningococcal MCV4O([place]) (Completed)   Need for pneumococcal vaccination       Relevant Orders   Pneumococcal polysaccharide vaccine 23-valent greater than or equal to 2yo subcutaneous/IM (Completed)       Health Maintenance: the patient was advised to go to the pharmacy for his second shingles vaccine.   Return in about 3 months (around 05/21/2021) for Diabetes.  This visit occurred during the SARS-CoV-2 public health emergency.  Safety protocols were in place, including screening questions prior to the visit, additional usage of staff PPE, and extensive cleaning of exam room while observing appropriate contact time as indicated for disinfecting solutions.    Austin Rumps, MD [place]

## 2021-04-17 ENCOUNTER — Other Ambulatory Visit: Payer: Self-pay | Admitting: Family Medicine

## 2021-04-17 DIAGNOSIS — E782 Mixed hyperlipidemia: Secondary | ICD-10-CM

## 2021-05-05 ENCOUNTER — Other Ambulatory Visit: Payer: Self-pay | Admitting: Family Medicine

## 2021-05-21 ENCOUNTER — Ambulatory Visit: Payer: Medicare Other | Admitting: Family Medicine

## 2021-06-03 ENCOUNTER — Encounter: Payer: Self-pay | Admitting: Family Medicine

## 2021-06-03 ENCOUNTER — Ambulatory Visit (INDEPENDENT_AMBULATORY_CARE_PROVIDER_SITE_OTHER): Payer: Medicare Other | Admitting: Family Medicine

## 2021-06-03 ENCOUNTER — Other Ambulatory Visit: Payer: Self-pay

## 2021-06-03 VITALS — BP 110/60 | HR 97 | Temp 98.6°F | Ht 68.0 in | Wt 166.6 lb

## 2021-06-03 DIAGNOSIS — Z794 Long term (current) use of insulin: Secondary | ICD-10-CM | POA: Diagnosis not present

## 2021-06-03 DIAGNOSIS — Z23 Encounter for immunization: Secondary | ICD-10-CM

## 2021-06-03 DIAGNOSIS — R0989 Other specified symptoms and signs involving the circulatory and respiratory systems: Secondary | ICD-10-CM

## 2021-06-03 DIAGNOSIS — G319 Degenerative disease of nervous system, unspecified: Secondary | ICD-10-CM | POA: Diagnosis not present

## 2021-06-03 DIAGNOSIS — E119 Type 2 diabetes mellitus without complications: Secondary | ICD-10-CM

## 2021-06-03 DIAGNOSIS — I10 Essential (primary) hypertension: Secondary | ICD-10-CM

## 2021-06-03 DIAGNOSIS — F320 Major depressive disorder, single episode, mild: Secondary | ICD-10-CM

## 2021-06-03 LAB — BASIC METABOLIC PANEL
BUN: 21 mg/dL (ref 6–23)
CO2: 26 mEq/L (ref 19–32)
Calcium: 9.6 mg/dL (ref 8.4–10.5)
Chloride: 104 mEq/L (ref 96–112)
Creatinine, Ser: 1.17 mg/dL (ref 0.40–1.50)
GFR: 63.91 mL/min (ref >60.00)
Glucose, Bld: 90 mg/dL (ref 70–99)
Potassium: 4.1 mEq/L (ref 3.5–5.1)
Sodium: 140 mEq/L (ref 135–145)

## 2021-06-03 LAB — MICROALBUMIN / CREATININE URINE RATIO
Creatinine,U: 158.9 mg/dL
Microalb Creat Ratio: 0.4 mg/g (ref 0.0–30.0)
Microalb, Ur: <0.7 mg/dL (ref 0.0–1.9)

## 2021-06-03 LAB — HEMOGLOBIN A1C: Hgb A1c MFr Bld: 6.3 % (ref 4.6–6.5)

## 2021-06-03 NOTE — Assessment & Plan Note (Signed)
He completed evaluation with neurology for this.  He will remain active.

## 2021-06-03 NOTE — Assessment & Plan Note (Signed)
Adequate control.  Continue amlodipine 5 mg once daily.

## 2021-06-03 NOTE — Assessment & Plan Note (Signed)
Seems to be well controlled.  We will check an A1c.  Also checking urine microalbumin.  I encouraged him to continue to monitor his diet and continue with his physical activity.

## 2021-06-03 NOTE — Assessment & Plan Note (Signed)
No symptoms.  He is currently not on medication.

## 2021-06-03 NOTE — Patient Instructions (Signed)
Nice to see you. We will get labs today. Somebody should contact you about the blood flow test for your legs.

## 2021-06-03 NOTE — Assessment & Plan Note (Signed)
ABIs ordered. 

## 2021-06-03 NOTE — Progress Notes (Signed)
Tommi Rumps, MD Phone: [phone]  Austin Richards is a 70 y.o. male who presents today for f/u.  DIABETES Disease Monitoring: Blood Sugar ranges-113s-120s Polyuria/phagia/dipsia- no      Optho- UTD Medications: Compliance- taking metformin Hypoglycemic symptoms- no Watching his diet with lower carbohydrate serving sizes.  HYPERTENSION Disease Monitoring Chest pain- NO    Dyspnea- no Medications Compliance-  taking amlodipine  Edema- no BMET    Component Value Date/Time   NA 138 08/16/2020 1132   K 4.4 08/16/2020 1132   CL 102 08/16/2020 1132   CO2 26 08/16/2020 1132   GLUCOSE 106 (H) 08/16/2020 1132   BUN 25 (H) 08/16/2020 1132   CREATININE 1.29 08/16/2020 1132   CALCIUM 9.6 08/16/2020 1132   GFRNONAA 51 (L) 10/29/2017 1350   GFRAA 59 (L) 10/29/2017 1350   Depression: no depression. No longer on meds.   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  He notes slight worsening but nothing significant.  He continues to do tai chi and yoga.  He has had no falls.  He has seen neurology for this.  Social History   Tobacco Use  Smoking Status Former  Smokeless Tobacco Never  Tobacco Comments   "smoked in my 63s for a short period of time"    Current Outpatient Medications on File Prior to Visit  Medication Sig Dispense Refill   Accu-Chek Softclix Lancets lancets USE AS DIRECTED 100 each 1   acetaminophen (TYLENOL) 500 MG tablet Take 500 mg by mouth as needed.     albuterol (VENTOLIN HFA) 108 (90 Base) MCG/ACT inhaler Inhale into the lungs.     amLODipine (NORVASC) 5 MG tablet TAKE 1/2 TABLET BY MOUTH DAILY 45 tablet 3   atorvastatin (LIPITOR) 80 MG tablet TAKE 1 TABLET BY MOUTH EVERY DAY FOR 6PM 90 tablet 1   blood glucose meter kit and supplies KIT Dispense based on patient and insurance preference. Use once daily. (FOR ICD-10 E11.9). 1 each 0   glucose blood (ACCU-CHEK GUIDE) test strip 1 each by Other route daily at 12 noon. Use as instructed 200 strip 2    levofloxacin (LEVAQUIN) 250 MG tablet Take by mouth.     metFORMIN (GLUCOPHAGE) 500 MG tablet TAKE 1 TABLET (500 MG TOTAL) BY MOUTH 2 (TWO) TIMES DAILY WITH A MEAL. 180 tablet 3   Multiple Vitamins-Minerals (MULTIVITAMIN PO) Take 1 tablet by mouth daily.     No current facility-administered medications on file prior to visit.     ROS see history of present illness  Objective  Physical Exam Vitals:   06/03/21 1118  BP: 110/60  Pulse: 97  Temp: 98.6 F (37 C)  SpO2: 99%    BP Readings from Last 3 Encounters:  06/03/21 110/60  02/18/21 130/70  10/28/20 130/70   Wt Readings from Last 3 Encounters:  06/03/21 166 lb 9.6 oz (75.6 kg)  02/18/21 164 lb 9.6 oz (74.7 kg)  10/28/20 164 lb 6.4 oz (74.6 kg)    Physical Exam Constitutional:      General: He is not in acute distress.    Appearance: He is not diaphoretic.  Cardiovascular:     Rate and Rhythm: Normal rate and regular rhythm.     Heart sounds: Normal heart sounds.  Pulmonary:     Effort: Pulmonary effort is normal.     Breath sounds: Normal breath sounds.  Musculoskeletal:     Right lower leg: No edema.     Left lower leg: No edema.  Skin:  General: Skin is warm and dry.  Neurological:     Mental Status: He is alert.     Assessment/Plan: Please see individual problem list.  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  He will remain active.      Decreased pedal pulses    ABIs ordered.      Relevant Orders   US ARTERIAL ABI (SCREENING LOWER EXTREMITY)   RESOLVED: Depression, major, single episode, mild (HCC)    No symptoms.  He is currently not on medication.      Diabetes ([place])    Seems to be well controlled.  We will check an A1c.  Also checking urine microalbumin.  I encouraged him to continue to monitor his diet and continue with his physical activity.      Relevant Orders   HgB A1c   Urine Microalbumin w/creat.  ratio   Hypertension    Adequate control.  Continue amlodipine 5 mg once daily.      Relevant Orders   Basic Metabolic Panel (BMET)   Other Visit Diagnoses     Need for immunization against influenza    -  Primary   Relevant Orders   Flu Vaccine QUAD High Dose(Fluad) (Completed)        Return in about 6 months (around 12/01/2021).  This visit occurred during the SARS-CoV-2 public health emergency.  Safety protocols were in place, including screening questions prior to the visit, additional usage of staff PPE, and extensive cleaning of exam room while observing appropriate contact time as indicated for disinfecting solutions.    Tommi Rumps, MD [place]

## 2021-06-16 ENCOUNTER — Ambulatory Visit
Admission: RE | Admit: 2021-06-16 | Discharge: 2021-06-16 | Disposition: A | Discharge status: Home / Self Care | Payer: Medicare Other | Source: Ambulatory Visit | Attending: Family Medicine | Admitting: Family Medicine

## 2021-06-16 ENCOUNTER — Other Ambulatory Visit: Payer: Self-pay

## 2021-06-16 DIAGNOSIS — R0989 Other specified symptoms and signs involving the circulatory and respiratory systems: Secondary | ICD-10-CM | POA: Diagnosis not present

## 2021-06-28 ENCOUNTER — Other Ambulatory Visit: Payer: Self-pay | Admitting: Family Medicine

## 2021-06-28 DIAGNOSIS — I739 Peripheral vascular disease, unspecified: Secondary | ICD-10-CM

## 2021-07-10 ENCOUNTER — Other Ambulatory Visit: Payer: Self-pay

## 2021-07-10 ENCOUNTER — Encounter (INDEPENDENT_AMBULATORY_CARE_PROVIDER_SITE_OTHER): Payer: Self-pay | Admitting: Nurse Practitioner

## 2021-07-10 ENCOUNTER — Ambulatory Visit (INDEPENDENT_AMBULATORY_CARE_PROVIDER_SITE_OTHER): Payer: Medicare Other | Admitting: Nurse Practitioner

## 2021-07-10 VITALS — BP 142/82 | HR 86 | Ht 67.0 in | Wt 170.0 lb

## 2021-07-10 DIAGNOSIS — Z794 Long term (current) use of insulin: Secondary | ICD-10-CM | POA: Diagnosis not present

## 2021-07-10 DIAGNOSIS — I739 Peripheral vascular disease, unspecified: Secondary | ICD-10-CM

## 2021-07-10 DIAGNOSIS — E782 Mixed hyperlipidemia: Secondary | ICD-10-CM

## 2021-07-10 DIAGNOSIS — I10 Essential (primary) hypertension: Secondary | ICD-10-CM

## 2021-07-10 DIAGNOSIS — E119 Type 2 diabetes mellitus without complications: Secondary | ICD-10-CM | POA: Diagnosis not present

## 2021-07-19 ENCOUNTER — Encounter (INDEPENDENT_AMBULATORY_CARE_PROVIDER_SITE_OTHER): Payer: Self-pay | Admitting: Nurse Practitioner

## 2021-07-19 NOTE — Progress Notes (Signed)
Continue antihypertensive medications as already ordered, these medications have been reviewed and there are no changes at this time.   Subjective:    Patient ID: [place], male    DOB: 10/10/1950, 70 y.o.   MRN: [id number] Chief Complaint  Patient presents with   New Patient (Initial Visit)    NP consult pad on abi in epic referred by Osa Craver Hone is a 70 year old male that presents today for evaluation from his primary care provider Dr. Caryl Bis in regards to decreased pedal pulses.  Mr. 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  Despite this it does not cause significant issues for him as it pertains to ambulation.  The patient remains fairly active.  He denies any significant claudication-like symptoms.  He denies any rest pain like symptoms.  There are no wounds or ulcerations.  The patient had ABIs done at outside office which show a right ABI 0.74 and a left of 0.79.  He appears to have monophasic/biphasic waveforms in the right lower extremity with monophasic/biphasic waveforms in the left lower extremity as well.   Review of Systems  HENT:  Positive for hearing loss.   Musculoskeletal:  Positive for gait problem.  Skin:  Negative for wound.  All other systems reviewed and are negative.     Objective:   Physical Exam Vitals reviewed.  HENT:     Head: Normocephalic.  Cardiovascular:     Rate and Rhythm: Normal rate.     Pulses: Decreased pulses.  Pulmonary:     Effort: Pulmonary effort is normal.  Skin:    General: Skin is warm and dry.  Neurological:     Mental Status: He is alert and oriented to person, place, and time.     Gait: Gait abnormal.  Psychiatric:        Mood and Affect: Mood normal.        Behavior: Behavior normal.        Thought Content: Thought content normal.        Judgment: Judgment normal.    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   Hx of colonic polyp    Hyperlipidemia    Migraine    Pre-diabetes     Social History   Socioeconomic History   Marital status: Single    Spouse name: Not on file   Number of children: 0   Years of education: some college   Highest education level: Not on file  Occupational History   Not on file  Tobacco Use   Smoking status: Former   Smokeless tobacco: Never   Tobacco comments:    "smoked in my 19s for a short period of time"  Vaping Use   Vaping Use: Never used  Substance and Sexual Activity   Alcohol use: Yes    Comment: rarely - social, "heavily in the past"   Drug use: No   Sexual activity: Not on file  Other Topics Concern   Not on file  Social History Narrative   Right-handed.   Lives alone (sister is close by).   1 cup  caffeine per day.   Social Determinants of Health   Financial Resource Strain: Not on file  Food Insecurity: Not on file  Transportation Needs: Not on file  Physical Activity: Not on file  Stress: Not on file  Social Connections: Not on file  Intimate Partner Violence: Not on file    Past Surgical History:  Procedure Laterality Date   CHOLECYSTECTOMY     TOTAL SHOULDER ARTHROPLASTY Right 11/10/2017   Procedure: TOTAL SHOULDER ARTHROPLASTY;  Surgeon: Hiram Gash, MD;  Location: [facility];  Service: Orthopedics;  Laterality: Right;    Family History  Adopted: Yes    Allergies  Allergen Reactions   Codeine Nausea And Vomiting   Penicillins Hives, Nausea And Vomiting and Other (See Comments)    Has patient had a PCN reaction causing immediate rash, facial/tongue/throat swelling, SOB or lightheadedness with hypotension: Yes Has patient had a PCN reaction causing severe rash involving mucus membranes or skin necrosis: No Has patient had a PCN reaction that  required hospitalization: Yes Has patient had a PCN reaction occurring within the last 10 years: No If all of the above answers are "NO", then may proceed with Cephalosporin use.    Latex Itching    itching    CBC Latest Ref Rng & Units 07/03/2019 10/29/2017 10/04/2017  WBC 4.0 - 10.5 K/uL 7.0 6.6 6.4  Hemoglobin 13.0 - 17.0 g/dL 16.0 15.4 17.0  Hematocrit 39.0 - 52.0 % 46.2 44.2 49.2  Platelets 150.0 - 400.0 K/uL 279.0 246 264.0      CMP     Component Value Date/Time   NA 140 06/03/2021 1156   K 4.1 06/03/2021 1156   CL 104 06/03/2021 1156   CO2 26 06/03/2021 1156   GLUCOSE 90 06/03/2021 1156   BUN 21 06/03/2021 1156   CREATININE 1.17 06/03/2021 1156   CALCIUM 9.6 06/03/2021 1156   PROT 7.0 08/16/2020 1132   ALBUMIN 4.5 08/16/2020 1132   AST 26 08/16/2020 1132   ALT 33 08/16/2020 1132   ALKPHOS 118 (H) 08/16/2020 1132   BILITOT 2.0 (H) 08/16/2020 1132   GFRNONAA 51 (L) 10/29/2017 1350   GFRAA 59 (L) 10/29/2017 1350     No results found.     Assessment & Plan:   1. PAD (peripheral artery disease) ([place]) After discussion with the patient and his sister, the patient very minimal claudication symptoms and they are not lifestyle limiting at this point.  We discussed the possibility of angiogram versus close follow-up.  At this time because the patient has no limb threatening symptoms and according to the patient's wishes we will proceed with close follow-up.  The patient will return in 3 months with noninvasive studies however if the patient should develop rest pain or having worsening pain in the lower extremities he should follow-up for a sooner evaluation. - VAS [place] ABI WITH/WO TBI; Future - VAS [place] LOWER EXTREMITY ARTERIAL DUPLEX; Future  2. Mixed hyperlipidemia Continue statin as ordered and reviewed, no changes at this time   3. Type 2 diabetes mellitus without complication, with long-term current use of insulin (HCC) Continue hypoglycemic medications as already  ordered, these medications have been reviewed and there are no changes at this time.  Hgb A1C to be monitored as already arranged by primary service   4. Primary hypertension Continue antihypertensive medications as already ordered, these medications have been reviewed and there are no changes at this time.    Current Outpatient Medications on File Prior to Visit  Medication Sig Dispense Refill   Accu-Chek Softclix Lancets lancets USE AS DIRECTED 100 each 1   acetaminophen (TYLENOL) 500 MG tablet Take 500 mg by mouth as needed.     albuterol (VENTOLIN HFA) 108 (90 Base) MCG/ACT inhaler Inhale into the lungs.     amLODipine (NORVASC) 5 MG tablet TAKE 1/2 TABLET BY MOUTH DAILY 45 tablet 3   atorvastatin (LIPITOR) 80 MG tablet TAKE 1 TABLET BY MOUTH EVERY DAY FOR 6PM 90 tablet 1   blood glucose meter kit and supplies KIT Dispense based on patient and insurance preference. Use once daily. (FOR ICD-10 E11.9). 1 each 0   glucose blood (ACCU-CHEK GUIDE) test strip 1 each by Other route daily at 12 noon. Use as instructed 200 strip 2   levofloxacin (LEVAQUIN) 250 MG tablet Take by mouth.     metFORMIN (GLUCOPHAGE) 500 MG tablet TAKE 1 TABLET (500 MG TOTAL) BY MOUTH 2 (TWO) TIMES DAILY WITH A MEAL. 180 tablet 3   Multiple Vitamins-Minerals (MULTIVITAMIN PO) Take 1 tablet by mouth daily.     No current facility-administered medications on file prior to visit.    There are no Patient Instructions on file for this visit. Return in about 3 months (around 10/10/2021) for PAD.   Kris Hartmann, NP

## 2021-07-29 ENCOUNTER — Telehealth: Payer: Self-pay | Admitting: Family Medicine

## 2021-07-29 NOTE — Telephone Encounter (Signed)
Pt sister called in stating that Pt tested positive on 07/27/2021. Pt is experiencing Covid symptoms. Pt sister stated that Pt has a bad cough, low fever, and sinus congestion. Pt sister stated that Pt had a fever of 101.0 3 days ago and scratchy throat. Pt sister stated that in January when the Pt caught Covid the first time, she had to take Pt to ED. ED prescribe medication called Promethazine 6.25/15mg  61ml by Dr. Doyle Askew. Pt sister  would like to have Promethazine 6.25/15mg  6ml  to be prescribe again to Pt. Pt sister stated that Pt is taking OTC tylenol and mucinex.

## 2021-07-29 NOTE — Telephone Encounter (Signed)
Patient has covid and has had covid before and was prescribed promethazine for his cough, patient just wants the promethazine sent to his pharmacy because it help him before.  Austin Richards,cma

## 2021-07-30 MED ORDER — BENZONATATE 200 MG PO CAPS: 200.0000 mg | ORAL_CAPSULE | Freq: Two times a day (BID) | ORAL | 0 refills | Status: DC | PRN

## 2021-07-30 NOTE — Telephone Encounter (Signed)
I called and spoke with the patients sister, patient is having a little SOB and using his inhaler from the prior covid in the past, no chest pain, had a fever last 2 days but has gone and te highest was 101.  The cough is the only thing that's bad and they wanted the cough medication from before prescribed at the ED.  Demontre Padin,cma

## 2021-07-30 NOTE — Telephone Encounter (Signed)
Patient's sister returned the call . She is unable to go where the patient is , because she is getting over Covid. She thinks if she goes to her brothers she may get Covid again.The patient, per his sister can't hear well. Therefore, the patient will not be able to do a virtual visit without his sister being present. She stated if he gets any worse she will take him to the ED.

## 2021-07-30 NOTE — Telephone Encounter (Signed)
He needs to complete a virtual visit to determine appropriate management of this. This could be through [facility] or with any available provider virtually.

## 2021-07-30 NOTE — Telephone Encounter (Signed)
I agree with her not going to be around him. If they are not going to do a virtual visit then I need more details on his symptoms. Is he having shortness of breath, chest pain, fevers >103 F? If so, he needs to be seen in person. What other symptoms is he having? When did his symptoms start?

## 2021-07-30 NOTE — Telephone Encounter (Signed)
Patient's sister returned the call . She is unable to go where the patient is , because she is getting over Covid. She thinks if she goes to her brothers she may get Covid again.The patient, per his sister can't hear well. Therefore, the patient will not be able to do a virtual visit without his sister being present. She stated if he gets any worse she will take him to the ED.  FYI.  Leah Skora,cma

## 2021-07-30 NOTE — Telephone Encounter (Signed)
Noted.  I sent in Tessalon for his cough.  If his breathing worsens at all or is not improving he really should be looked at in person at urgent care.

## 2021-07-31 NOTE — Telephone Encounter (Signed)
I called and LVM informing the patient that the provider sent in a cough medication for the patient.  Prinston Kynard,cma

## 2021-09-02 ENCOUNTER — Ambulatory Visit: Payer: Medicare Other | Admitting: Family Medicine

## 2021-09-30 ENCOUNTER — Ambulatory Visit (INDEPENDENT_AMBULATORY_CARE_PROVIDER_SITE_OTHER): Payer: Medicare Other

## 2021-09-30 ENCOUNTER — Ambulatory Visit (INDEPENDENT_AMBULATORY_CARE_PROVIDER_SITE_OTHER): Payer: Medicare Other | Admitting: Family

## 2021-09-30 ENCOUNTER — Encounter: Payer: Self-pay | Admitting: Family

## 2021-09-30 ENCOUNTER — Telehealth: Payer: Self-pay | Admitting: Family Medicine

## 2021-09-30 ENCOUNTER — Ambulatory Visit: Payer: Medicare Other | Admitting: Family

## 2021-09-30 ENCOUNTER — Other Ambulatory Visit: Payer: Self-pay

## 2021-09-30 VITALS — BP 110/60 | HR 112 | Ht 67.0 in | Wt 169.0 lb

## 2021-09-30 DIAGNOSIS — R109 Unspecified abdominal pain: Secondary | ICD-10-CM

## 2021-09-30 DIAGNOSIS — E119 Type 2 diabetes mellitus without complications: Secondary | ICD-10-CM

## 2021-09-30 DIAGNOSIS — Z794 Long term (current) use of insulin: Secondary | ICD-10-CM | POA: Diagnosis not present

## 2021-09-30 DIAGNOSIS — R070 Pain in throat: Secondary | ICD-10-CM

## 2021-09-30 DIAGNOSIS — R079 Chest pain, unspecified: Secondary | ICD-10-CM | POA: Diagnosis not present

## 2021-09-30 DIAGNOSIS — G44011 Episodic cluster headache, intractable: Secondary | ICD-10-CM

## 2021-09-30 DIAGNOSIS — I10 Essential (primary) hypertension: Secondary | ICD-10-CM

## 2021-09-30 NOTE — Telephone Encounter (Addendum)
° ° °  I called and spoke with patients sister and he is scheduled to see provider today..  Shae Hinnenkamp,cma

## 2021-09-30 NOTE — Telephone Encounter (Signed)
Pts sister called in regards to pt. Two days ago 09/28/21 at 1:30AM pt began to feel a strong cramp in his stomach which traveled to his chest then to his head and back to his neck. Pt is stating his pain is around an 8 and still continuing. Pt was sent to access nurse also holding 3:30 spot with P.Webb.

## 2021-10-01 LAB — COMPREHENSIVE METABOLIC PANEL
ALT: 26 U/L (ref 0–53)
AST: 15 U/L (ref 0–37)
Albumin: 4 g/dL (ref 3.5–5.2)
Alkaline Phosphatase: 95 U/L (ref 39–117)
BUN: 19 mg/dL (ref 6–23)
CO2: 27 mEq/L (ref 19–32)
Calcium: 9.2 mg/dL (ref 8.4–10.5)
Chloride: 100 mEq/L (ref 96–112)
Creatinine, Ser: 1.27 mg/dL (ref 0.40–1.50)
GFR: 57.79 mL/min — ABNORMAL LOW (ref >60.00)
Glucose, Bld: 118 mg/dL — ABNORMAL HIGH (ref 70–99)
Potassium: 4.4 mEq/L (ref 3.5–5.1)
Sodium: 138 mEq/L (ref 135–145)
Total Bilirubin: 2 mg/dL — ABNORMAL HIGH (ref 0.2–1.2)
Total Protein: 6.8 g/dL (ref 6.0–8.3)

## 2021-10-01 LAB — CBC WITH DIFFERENTIAL/PLATELET
Basophils Absolute: 0 10*3/uL (ref 0.0–0.1)
Basophils Relative: 0.3 % (ref 0.0–3.0)
Eosinophils Absolute: 0.2 10*3/uL (ref 0.0–0.7)
Eosinophils Relative: 1.8 % (ref 0.0–5.0)
HCT: 44.6 % (ref 39.0–52.0)
Hemoglobin: 15.3 g/dL (ref 13.0–17.0)
Lymphocytes Relative: 10.1 % — ABNORMAL LOW (ref 12.0–46.0)
Lymphs Abs: 1 10*3/uL (ref 0.7–4.0)
MCHC: 34.3 g/dL (ref 30.0–36.0)
MCV: 87.2 fl (ref 78.0–100.0)
Monocytes Absolute: 1 10*3/uL (ref 0.1–1.0)
Monocytes Relative: 10 % (ref 3.0–12.0)
Neutro Abs: 7.5 10*3/uL (ref 1.4–7.7)
Neutrophils Relative %: 77.8 % — ABNORMAL HIGH (ref 43.0–77.0)
Platelets: 280 10*3/uL (ref 150.0–400.0)
RBC: 5.11 Mil/uL (ref 4.22–5.81)
RDW: 13.9 % (ref 11.5–15.5)
WBC: 9.6 10*3/uL (ref 4.0–10.5)

## 2021-10-01 LAB — H. PYLORI BREATH TEST: H. pylori Breath Test: NOT DETECTED

## 2021-10-01 LAB — HEMOGLOBIN A1C: Hgb A1c MFr Bld: 6.5 % (ref 4.6–6.5)

## 2021-10-02 NOTE — Progress Notes (Signed)
Acute Office Visit  Subjective:    Patient ID: Austin Richards, male    DOB: 09/08/1951, 71 y.o.   MRN: [id number]  Chief Complaint  Patient presents with   stomach pain   elevated BP    HPI Patient is in today with c/o abdominal cramping that starts from the bottom of his abdomen and runs all the way up to the top of his head. Reports it lasting about 5 minutes then going away. It occurred several hours later the same day and he has not had the issues anymore in 1 week. Patient is death and is here accompanied by his sister who is helping to clarify questions. She reports he is dramatic and stresses a lot. He admits to burping more but denies heartburn and indigestion. Sister was concerned that he may have PNA because he has had it in the past. Sister also reports his glucose has been running in the 170s. She is concerned and wants to be sure he does not need a medication adjustment.  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   Hx of colonic polyp    Hyperlipidemia    Migraine    Pre-diabetes     Past Surgical History:  Procedure Laterality Date   CHOLECYSTECTOMY     TOTAL SHOULDER ARTHROPLASTY Right 11/10/2017   Procedure: TOTAL SHOULDER ARTHROPLASTY;  Surgeon: Hiram Gash, MD;  Location: [facility];  Service: Orthopedics;  Laterality: Right;    Family History  Adopted: Yes    Social History   Socioeconomic History   Marital status: Single    Spouse name: Not on file   Number of children: 0   Years of education: some college   Highest education level: Not on file  Occupational History   Not on file  Tobacco Use   Smoking status: Former   Smokeless tobacco: Never   Tobacco comments:    "smoked in my 36s for a short period of time"  Vaping Use   Vaping Use: Never used  Substance and Sexual Activity   Alcohol use: Yes     Comment: rarely - social, "heavily in the past"   Drug use: No   Sexual activity: Not on file  Other Topics Concern   Not on file  Social History Narrative   Right-handed.   Lives alone (sister is close by).   1 cup caffeine per day.   Social Determinants of Health   Financial Resource Strain: Not on file  Food Insecurity: Not on file  Transportation Needs: Not on file  Physical Activity: Not on file  Stress: Not on file  Social Connections: Not on file  Intimate Partner Violence: Not on file    Outpatient Medications Prior to Visit  Medication Sig Dispense Refill   Accu-Chek Softclix Lancets lancets USE AS DIRECTED 100 each 1   acetaminophen (TYLENOL) 500 MG tablet Take 500 mg by mouth as needed.     albuterol (VENTOLIN HFA) 108 (90 Base) MCG/ACT inhaler Inhale into the lungs.     amLODipine (NORVASC) 5 MG tablet TAKE 1/2 TABLET BY MOUTH DAILY 45 tablet 3   atorvastatin (LIPITOR) 80 MG tablet TAKE 1 TABLET BY MOUTH EVERY DAY FOR 6PM 90 tablet 1   benzonatate (TESSALON) 200 MG capsule Take 1 capsule (200 mg total) by mouth 2 (two) times daily  as needed for cough. 20 capsule 0   blood glucose meter kit and supplies KIT Dispense based on patient and insurance preference. Use once daily. (FOR ICD-10 E11.9). 1 each 0   glucose blood (ACCU-CHEK GUIDE) test strip 1 each by Other route daily at 12 noon. Use as instructed 200 strip 2   levofloxacin (LEVAQUIN) 250 MG tablet Take by mouth.     metFORMIN (GLUCOPHAGE) 500 MG tablet TAKE 1 TABLET (500 MG TOTAL) BY MOUTH 2 (TWO) TIMES DAILY WITH A MEAL. 180 tablet 3   Multiple Vitamins-Minerals (MULTIVITAMIN PO) Take 1 tablet by mouth daily.     No facility-administered medications prior to visit.    Allergies  Allergen Reactions   Codeine Nausea And Vomiting   Penicillins Hives, Nausea And Vomiting and Other (See Comments)    Has patient had a PCN reaction causing immediate rash, facial/tongue/throat swelling, SOB or  lightheadedness with hypotension: Yes Has patient had a PCN reaction causing severe rash involving mucus membranes or skin necrosis: No Has patient had a PCN reaction that required hospitalization: Yes Has patient had a PCN reaction occurring within the last 10 years: No If all of the above answers are "NO", then may proceed with Cephalosporin use.    Latex Itching    itching    Review of Systems  Gastrointestinal:  Positive for abdominal pain.       Increased burping  Skin: Negative.   Neurological:  Positive for light-headedness and headaches.  All other systems reviewed and are negative.     Objective:    Physical Exam Vitals and nursing note reviewed. Exam conducted with a chaperone present (sister present).  Constitutional:      Appearance: Normal appearance.  Cardiovascular:     Rate and Rhythm: Normal rate and regular rhythm.  Pulmonary:     Effort: Pulmonary effort is normal.     Breath sounds: Normal breath sounds.  Abdominal:     General: Abdomen is flat.     Palpations: Abdomen is soft.  Musculoskeletal:        General: Normal range of motion.     Cervical back: Normal range of motion and neck supple.  Skin:    General: Skin is warm and dry.  Neurological:     General: No focal deficit present.     Mental Status: He is alert and oriented to person, place, and time.  Psychiatric:        Mood and Affect: Mood normal.        Behavior: Behavior normal.   BP 110/60 (BP Location: Left Arm, Patient Position: Sitting, Cuff Size: Normal)    Pulse (!) 112    Ht 5' 7"  (1.702 m)    Wt 169 lb (76.7 kg)    SpO2 98%    BMI 26.47 kg/m  Wt Readings from Last 3 Encounters:  09/30/21 169 lb (76.7 kg)  07/10/21 170 lb (77.1 kg)  06/03/21 166 lb 9.6 oz (75.6 kg)    Health Maintenance Due  Topic Date Due   Hepatitis C Screening  Never done   TETANUS/TDAP  Never done   Zoster Vaccines- Shingrix (2 of 2) 07/22/2020   OPHTHALMOLOGY EXAM  08/08/2021    There are no  preventive care reminders to display for this patient.   Lab Results  Component Value Date   TSH 2.480 10/21/2017   Lab Results  Component Value Date   WBC 9.6 09/30/2021   HGB 15.3 09/30/2021   HCT 44.6 09/30/2021  MCV 87.2 09/30/2021   PLT 280.0 09/30/2021   Lab Results  Component Value Date   NA 138 09/30/2021   K 4.4 09/30/2021   CO2 27 09/30/2021   GLUCOSE 118 (H) 09/30/2021   BUN 19 09/30/2021   CREATININE 1.27 09/30/2021   BILITOT 2.0 (H) 09/30/2021   ALKPHOS 95 09/30/2021   AST 15 09/30/2021   ALT 26 09/30/2021   PROT 6.8 09/30/2021   ALBUMIN 4.0 09/30/2021   CALCIUM 9.2 09/30/2021   ANIONGAP 11 10/29/2017   GFR 57.79 (L) 09/30/2021   Lab Results  Component Value Date   CHOL 106 08/16/2020   Lab Results  Component Value Date   HDL 29.40 (L) 08/16/2020   No results found for: [facility] Lab Results  Component Value Date   TRIG 347.0 (H) 08/16/2020   Lab Results  Component Value Date   CHOLHDL 4 08/16/2020   Lab Results  Component Value Date   HGBA1C 6.5 09/30/2021       Assessment & Plan:   Problem List Items Addressed This Visit     Hypertension   Relevant Orders   Comp Met (CMET) (Completed)   CBC w/Diff (Completed)   Diabetes ([place])   Relevant Orders   Comp Met (CMET) (Completed)   HgB A1c (Completed)   CBC w/Diff (Completed)   Other Visit Diagnoses     Abdominal cramping    -  Primary   Relevant Orders   Comp Met (CMET) (Completed)   CBC w/Diff (Completed)   H. pylori breath test (Completed)   Intractable episodic cluster headache       Relevant Orders   CBC w/Diff (Completed)   Pain in throat and chest       Relevant Orders   DG Chest 2 View (Completed)        No orders of the defined types were placed in this encounter. Symptoms are very vague. Labs ordered, CXR, will notify patient pending results and discuss further treatment plan thereafter.    Kennyth Arnold, FNP

## 2021-10-09 ENCOUNTER — Ambulatory Visit (INDEPENDENT_AMBULATORY_CARE_PROVIDER_SITE_OTHER): Payer: Medicare Other | Admitting: Nurse Practitioner

## 2021-10-09 ENCOUNTER — Ambulatory Visit (INDEPENDENT_AMBULATORY_CARE_PROVIDER_SITE_OTHER): Payer: Medicare Other

## 2021-10-09 ENCOUNTER — Encounter (INDEPENDENT_AMBULATORY_CARE_PROVIDER_SITE_OTHER): Payer: Self-pay | Admitting: Nurse Practitioner

## 2021-10-09 ENCOUNTER — Other Ambulatory Visit: Payer: Self-pay

## 2021-10-09 VITALS — BP 127/78 | HR 77 | Resp 17 | Ht 67.0 in | Wt 168.0 lb

## 2021-10-09 DIAGNOSIS — I10 Essential (primary) hypertension: Secondary | ICD-10-CM | POA: Diagnosis not present

## 2021-10-09 DIAGNOSIS — I739 Peripheral vascular disease, unspecified: Secondary | ICD-10-CM

## 2021-10-09 DIAGNOSIS — E119 Type 2 diabetes mellitus without complications: Secondary | ICD-10-CM | POA: Diagnosis not present

## 2021-10-09 DIAGNOSIS — Z794 Long term (current) use of insulin: Secondary | ICD-10-CM

## 2021-10-09 DIAGNOSIS — E782 Mixed hyperlipidemia: Secondary | ICD-10-CM | POA: Diagnosis not present

## 2021-10-19 ENCOUNTER — Encounter (INDEPENDENT_AMBULATORY_CARE_PROVIDER_SITE_OTHER): Payer: Self-pay | Admitting: Nurse Practitioner

## 2021-10-19 NOTE — Progress Notes (Signed)
Subjective:    Patient ID: Austin Richards, male    DOB: [birth year], 71 y.o.   MRN: [id number] Chief Complaint  Patient presents with   Follow-up    Austin Richards is a 71 year old male that presents today for evaluation from his primary care provider Dr. Caryl Bis in regards to decreased pedal pulses.  Mr. 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  Despite this it does not cause significant issues for him as it pertains to ambulation.  However he does note that he has to stop and refocus every so often so that he can maintain his balance.  He has occasional left-sided pain but not consistent.  The patient remains fairly active.  He denies any significant claudication-like symptoms.  He denies any rest pain like symptoms.  There are no wounds or ulcerations.   Today the patient has an ABI of 1.00 on the right and 0.86 on the left.  There is a TBI 1.00 on the right and 0.97 on the left.  He has biphasic waveforms in the right with triphasic/biphasic waveforms on the left.  Lower extremity arterial duplexes show biphasic waveforms throughout the left lower extremity.  The right lower extremity has monophasic waveforms in the femoral artery however beginning at the proximal SFA and distally it is biphasic.   Review of Systems  HENT:  Positive for hearing loss.   Musculoskeletal:  Positive for gait problem.  All other systems reviewed and are negative.     Objective:   Physical Exam Vitals reviewed.  HENT:     Head: Normocephalic.  Cardiovascular:     Rate and Rhythm: Normal rate.     Pulses:          Dorsalis pedis pulses are 1+ on the right side and 1+ on the left side.  Pulmonary:     Effort: Pulmonary effort is normal.  Skin:    General: Skin is warm and dry.  Neurological:     Mental Status: He is alert and oriented to person, place, and time.  Psychiatric:        Mood and Affect: Mood normal.         Behavior: Behavior normal.        Thought Content: Thought content normal.        Judgment: Judgment normal.    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   Hx of colonic polyp    Hyperlipidemia    Migraine    Pre-diabetes     Social History   Socioeconomic History   Marital status: Single    Spouse name: Not on file   Number of children: 0   Years of education: some college   Highest education level: Not on file  Occupational History   Not on file  Tobacco Use   Smoking status: Former   Smokeless tobacco: Never   Tobacco comments:    "smoked in my 15s for a short period of time"  Vaping Use   Vaping Use: Never used  Substance and  Sexual Activity   Alcohol use: Yes    Comment: rarely - social, "heavily in the past"   Drug use: No   Sexual activity: Not on file  Other Topics Concern   Not on file  Social History Narrative   Right-handed.   Lives alone (sister is close by).   1 cup caffeine per day.   Social Determinants of Health   Financial Resource Strain: Not on file  Food Insecurity: Not on file  Transportation Needs: Not on file  Physical Activity: Not on file  Stress: Not on file  Social Connections: Not on file  Intimate Partner Violence: Not on file    Past Surgical History:  Procedure Laterality Date   CHOLECYSTECTOMY     TOTAL SHOULDER ARTHROPLASTY Right 11/10/2017   Procedure: TOTAL SHOULDER ARTHROPLASTY;  Surgeon: Hiram Gash, MD;  Location: [facility];  Service: Orthopedics;  Laterality: Right;    Family History  Adopted: Yes    Allergies  Allergen Reactions   Codeine Nausea And Vomiting   Penicillins Hives, Nausea And Vomiting and Other (See Comments)    Has patient had a PCN reaction causing immediate  rash, facial/tongue/throat swelling, SOB or lightheadedness with hypotension: Yes Has patient had a PCN reaction causing severe rash involving mucus membranes or skin necrosis: No Has patient had a PCN reaction that required hospitalization: Yes Has patient had a PCN reaction occurring within the last 10 years: No If all of the above answers are "NO", then may proceed with Cephalosporin use.    Latex Itching    itching    CBC Latest Ref Rng & Units 09/30/2021 07/03/2019 10/29/2017  WBC 4.0 - 10.5 K/uL 9.6 7.0 6.6  Hemoglobin 13.0 - 17.0 g/dL 15.3 16.0 15.4  Hematocrit 39.0 - 52.0 % 44.6 46.2 44.2  Platelets 150.0 - 400.0 K/uL 280.0 279.0 246      CMP     Component Value Date/Time   NA 138 09/30/2021 1611   K 4.4 09/30/2021 1611   CL 100 09/30/2021 1611   CO2 27 09/30/2021 1611   GLUCOSE 118 (H) 09/30/2021 1611   BUN 19 09/30/2021 1611   CREATININE 1.27 09/30/2021 1611   CALCIUM 9.2 09/30/2021 1611   PROT 6.8 09/30/2021 1611   ALBUMIN 4.0 09/30/2021 1611   AST 15 09/30/2021 1611   ALT 26 09/30/2021 1611   ALKPHOS 95 09/30/2021 1611   BILITOT 2.0 (H) 09/30/2021 1611   GFRNONAA 51 (L) 10/29/2017 1350   GFRAA 59 (L) 10/29/2017 1350     VAS [place] ABI WITH/WO TBI  Result Date: 10/09/2021  [place] STUDY Patient Name:  Austin Richards  Date of Exam:   10/09/2021 Medical Rec #: [id number]                Accession #:    [id number] Date of Birth: [birth year]                Patient Gender: M Patient Age:   89 years Exam Location:  [facility] Procedure:      VAS [place] ABI WITH/WO TBI Referring Phys: --------------------------------------------------------------------------------   Performing Technologist: Concha Norway RVT  Examination Guidelines: A complete evaluation includes at minimum, Doppler waveform signals and systolic blood pressure reading at the level of bilateral brachial, anterior tibial, and posterior tibial arteries, when vessel segments are  accessible. Bilateral testing is considered an integral part of a complete examination. Photoelectric Plethysmograph (PPG) waveforms and toe systolic pressure readings are included  as required and additional duplex testing as needed. Limited examinations for reoccurring indications may be performed as noted.  ABI Findings: +---------+------------------+-----+--------+--------+  Right     Rt Pressure (mmHg) Index Waveform Comment   +---------+------------------+-----+--------+--------+  Brachial  118                                         +---------+------------------+-----+--------+--------+  ATA       112                      biphasic .95       +---------+------------------+-----+--------+--------+  PTA       118                1.00  biphasic           +---------+------------------+-----+--------+--------+  Great Toe 118                1.00  Abnormal           +---------+------------------+-----+--------+--------+ +---------+------------------+-----+---------+-------+  Left      Lt Pressure (mmHg) Index Waveform  Comment  +---------+------------------+-----+---------+-------+  ATA       95                       triphasic .81      +---------+------------------+-----+---------+-------+  PTA       101                0.86  biphasic           +---------+------------------+-----+---------+-------+  Great Toe 115                0.97  Abnormal           +---------+------------------+-----+---------+-------+  Summary: Right: Resting right ankle-brachial index is within normal range. No evidence of significant right lower extremity arterial disease. The right toe-brachial index is normal. Although ankle brachial indices are within normal limits (0.95-1.29), arterial Doppler waveforms at the ankle suggest some component of arterial occlusive disease. Left: Resting left ankle-brachial index is within normal range. No evidence of significant left lower extremity arterial disease. The left toe-brachial index is normal. Although  ankle brachial indices are within normal limits (0.95-1.29), arterial Doppler waveforms at the ankle suggest some component of arterial occlusive disease.  *See table(s) above for measurements and observations.  Electronically signed by Hortencia Pilar MD on 10/09/2021 at 2:36:52 PM.    Final        Assessment & Plan:   1. PAD (peripheral artery disease) ([place]) After discussion with the patient and his sister, the patient very minimal claudication symptoms and they are not lifestyle limiting at this point.  We discussed the possibility of angiogram versus close follow-up.  At this time because the patient has no limb threatening symptoms and according to the patient's wishes we will proceed with close follow-up.  The patient will return in 6 months with noninvasive studies however if the patient should develop rest pain or having worsening pain in the lower extremities he should follow-up for a sooner evaluation.  At follow-up we will obtain an aortoiliac duplex to evaluate possible inflow disease.  2. Mixed hyperlipidemia Continue statin as ordered and reviewed, no changes at this time   3. Type 2 diabetes mellitus without complication, with long-term current use of insulin (HCC) Continue hypoglycemic medications as already ordered, these medications  have been reviewed and there are no changes at this time.  Hgb A1C to be monitored as already arranged by primary service   4. Primary hypertension Continue antihypertensive medications as already ordered, these medications have been reviewed and there are no changes at this time.    Current Outpatient Medications on File Prior to Visit  Medication Sig Dispense Refill   Accu-Chek Softclix Lancets lancets USE AS DIRECTED 100 each 1   acetaminophen (TYLENOL) 500 MG tablet Take 500 mg by mouth as needed.     albuterol (VENTOLIN HFA) 108 (90 Base) MCG/ACT inhaler Inhale into the lungs.     amLODipine (NORVASC) 5 MG tablet TAKE 1/2 TABLET BY MOUTH  DAILY 45 tablet 3   atorvastatin (LIPITOR) 80 MG tablet TAKE 1 TABLET BY MOUTH EVERY DAY FOR 6PM 90 tablet 1   benzonatate (TESSALON) 200 MG capsule Take 1 capsule (200 mg total) by mouth 2 (two) times daily as needed for cough. 20 capsule 0   blood glucose meter kit and supplies KIT Dispense based on patient and insurance preference. Use once daily. (FOR ICD-10 E11.9). 1 each 0   glucose blood (ACCU-CHEK GUIDE) test strip 1 each by Other route daily at 12 noon. Use as instructed 200 strip 2   metFORMIN (GLUCOPHAGE) 500 MG tablet TAKE 1 TABLET (500 MG TOTAL) BY MOUTH 2 (TWO) TIMES DAILY WITH A MEAL. 180 tablet 3   Multiple Vitamins-Minerals (MULTIVITAMIN PO) Take 1 tablet by mouth daily.     levofloxacin (LEVAQUIN) 250 MG tablet Take by mouth. (Patient not taking: Reported on 10/09/2021)     No current facility-administered medications on file prior to visit.    There are no Patient Instructions on file for this visit. No follow-ups on file.   Kris Hartmann, NP

## 2021-10-21 ENCOUNTER — Other Ambulatory Visit: Payer: Self-pay | Admitting: Family Medicine

## 2021-10-21 DIAGNOSIS — E782 Mixed hyperlipidemia: Secondary | ICD-10-CM

## 2021-10-22 ENCOUNTER — Other Ambulatory Visit: Payer: Self-pay | Admitting: Family Medicine

## 2021-10-22 DIAGNOSIS — Z794 Long term (current) use of insulin: Secondary | ICD-10-CM

## 2021-10-28 DIAGNOSIS — M9902 Segmental and somatic dysfunction of thoracic region: Secondary | ICD-10-CM | POA: Diagnosis not present

## 2021-10-28 DIAGNOSIS — M461 Sacroiliitis, not elsewhere classified: Secondary | ICD-10-CM | POA: Diagnosis not present

## 2021-10-28 DIAGNOSIS — M456 Ankylosing spondylitis lumbar region: Secondary | ICD-10-CM | POA: Diagnosis not present

## 2021-10-28 DIAGNOSIS — M5451 Vertebrogenic low back pain: Secondary | ICD-10-CM | POA: Diagnosis not present

## 2021-10-28 DIAGNOSIS — M9904 Segmental and somatic dysfunction of sacral region: Secondary | ICD-10-CM | POA: Diagnosis not present

## 2021-10-28 DIAGNOSIS — M9903 Segmental and somatic dysfunction of lumbar region: Secondary | ICD-10-CM | POA: Diagnosis not present

## 2021-10-28 DIAGNOSIS — M542 Cervicalgia: Secondary | ICD-10-CM | POA: Diagnosis not present

## 2021-10-28 DIAGNOSIS — M9901 Segmental and somatic dysfunction of cervical region: Secondary | ICD-10-CM | POA: Diagnosis not present

## 2021-10-30 DIAGNOSIS — M9902 Segmental and somatic dysfunction of thoracic region: Secondary | ICD-10-CM | POA: Diagnosis not present

## 2021-10-30 DIAGNOSIS — M461 Sacroiliitis, not elsewhere classified: Secondary | ICD-10-CM | POA: Diagnosis not present

## 2021-10-30 DIAGNOSIS — M9901 Segmental and somatic dysfunction of cervical region: Secondary | ICD-10-CM | POA: Diagnosis not present

## 2021-10-30 DIAGNOSIS — M9903 Segmental and somatic dysfunction of lumbar region: Secondary | ICD-10-CM | POA: Diagnosis not present

## 2021-10-30 DIAGNOSIS — M456 Ankylosing spondylitis lumbar region: Secondary | ICD-10-CM | POA: Diagnosis not present

## 2021-10-30 DIAGNOSIS — M5451 Vertebrogenic low back pain: Secondary | ICD-10-CM | POA: Diagnosis not present

## 2021-10-30 DIAGNOSIS — M542 Cervicalgia: Secondary | ICD-10-CM | POA: Diagnosis not present

## 2021-10-30 DIAGNOSIS — M9904 Segmental and somatic dysfunction of sacral region: Secondary | ICD-10-CM | POA: Diagnosis not present

## 2021-11-04 DIAGNOSIS — M9901 Segmental and somatic dysfunction of cervical region: Secondary | ICD-10-CM | POA: Diagnosis not present

## 2021-11-04 DIAGNOSIS — M542 Cervicalgia: Secondary | ICD-10-CM | POA: Diagnosis not present

## 2021-11-04 DIAGNOSIS — M9903 Segmental and somatic dysfunction of lumbar region: Secondary | ICD-10-CM | POA: Diagnosis not present

## 2021-11-04 DIAGNOSIS — M5451 Vertebrogenic low back pain: Secondary | ICD-10-CM | POA: Diagnosis not present

## 2021-11-04 DIAGNOSIS — M9904 Segmental and somatic dysfunction of sacral region: Secondary | ICD-10-CM | POA: Diagnosis not present

## 2021-11-04 DIAGNOSIS — M9902 Segmental and somatic dysfunction of thoracic region: Secondary | ICD-10-CM | POA: Diagnosis not present

## 2021-11-04 DIAGNOSIS — M456 Ankylosing spondylitis lumbar region: Secondary | ICD-10-CM | POA: Diagnosis not present

## 2021-11-04 DIAGNOSIS — M461 Sacroiliitis, not elsewhere classified: Secondary | ICD-10-CM | POA: Diagnosis not present

## 2021-11-11 DIAGNOSIS — M5451 Vertebrogenic low back pain: Secondary | ICD-10-CM | POA: Diagnosis not present

## 2021-11-11 DIAGNOSIS — M9901 Segmental and somatic dysfunction of cervical region: Secondary | ICD-10-CM | POA: Diagnosis not present

## 2021-11-11 DIAGNOSIS — M456 Ankylosing spondylitis lumbar region: Secondary | ICD-10-CM | POA: Diagnosis not present

## 2021-11-11 DIAGNOSIS — M9903 Segmental and somatic dysfunction of lumbar region: Secondary | ICD-10-CM | POA: Diagnosis not present

## 2021-11-11 DIAGNOSIS — M9904 Segmental and somatic dysfunction of sacral region: Secondary | ICD-10-CM | POA: Diagnosis not present

## 2021-11-11 DIAGNOSIS — M461 Sacroiliitis, not elsewhere classified: Secondary | ICD-10-CM | POA: Diagnosis not present

## 2021-11-11 DIAGNOSIS — M9902 Segmental and somatic dysfunction of thoracic region: Secondary | ICD-10-CM | POA: Diagnosis not present

## 2021-11-11 DIAGNOSIS — M542 Cervicalgia: Secondary | ICD-10-CM | POA: Diagnosis not present

## 2021-11-25 DIAGNOSIS — M9901 Segmental and somatic dysfunction of cervical region: Secondary | ICD-10-CM | POA: Diagnosis not present

## 2021-11-25 DIAGNOSIS — M9904 Segmental and somatic dysfunction of sacral region: Secondary | ICD-10-CM | POA: Diagnosis not present

## 2021-11-25 DIAGNOSIS — M456 Ankylosing spondylitis lumbar region: Secondary | ICD-10-CM | POA: Diagnosis not present

## 2021-11-25 DIAGNOSIS — M5451 Vertebrogenic low back pain: Secondary | ICD-10-CM | POA: Diagnosis not present

## 2021-11-25 DIAGNOSIS — M9902 Segmental and somatic dysfunction of thoracic region: Secondary | ICD-10-CM | POA: Diagnosis not present

## 2021-11-25 DIAGNOSIS — M9903 Segmental and somatic dysfunction of lumbar region: Secondary | ICD-10-CM | POA: Diagnosis not present

## 2021-11-25 DIAGNOSIS — M461 Sacroiliitis, not elsewhere classified: Secondary | ICD-10-CM | POA: Diagnosis not present

## 2021-11-25 DIAGNOSIS — M542 Cervicalgia: Secondary | ICD-10-CM | POA: Diagnosis not present

## 2021-12-02 ENCOUNTER — Ambulatory Visit (INDEPENDENT_AMBULATORY_CARE_PROVIDER_SITE_OTHER): Payer: Medicare Other

## 2021-12-02 ENCOUNTER — Telehealth: Payer: Self-pay

## 2021-12-02 ENCOUNTER — Other Ambulatory Visit: Payer: Self-pay

## 2021-12-02 ENCOUNTER — Ambulatory Visit (INDEPENDENT_AMBULATORY_CARE_PROVIDER_SITE_OTHER): Payer: Medicare Other | Admitting: Family Medicine

## 2021-12-02 ENCOUNTER — Encounter: Payer: Self-pay | Admitting: Family Medicine

## 2021-12-02 VITALS — BP 120/70 | HR 86 | Temp 97.8°F | Ht 67.0 in | Wt 165.6 lb

## 2021-12-02 DIAGNOSIS — R519 Headache, unspecified: Secondary | ICD-10-CM

## 2021-12-02 DIAGNOSIS — E119 Type 2 diabetes mellitus without complications: Secondary | ICD-10-CM | POA: Diagnosis not present

## 2021-12-02 DIAGNOSIS — M5412 Radiculopathy, cervical region: Secondary | ICD-10-CM | POA: Insufficient documentation

## 2021-12-02 DIAGNOSIS — R0789 Other chest pain: Secondary | ICD-10-CM

## 2021-12-02 DIAGNOSIS — Z794 Long term (current) use of insulin: Secondary | ICD-10-CM

## 2021-12-02 DIAGNOSIS — R270 Ataxia, unspecified: Secondary | ICD-10-CM

## 2021-12-02 LAB — SEDIMENTATION RATE: Sed Rate: 12 mm/hr (ref 0–20)

## 2021-12-02 NOTE — Assessment & Plan Note (Signed)
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  Possibly mildly worse at times.  He will continue to use a walking stick.  Handicap placard forms given to the patient. ?

## 2021-12-02 NOTE — Assessment & Plan Note (Signed)
Symptoms seem most consistent with cervical radiculopathy.  This may have been related to him lifting things previously.  The numbness only occurred in the radial aspect of his right arm and thumb.  We will get a neck x-ray today.  He will monitor for recurrence and if it does recur he will seek medical attention. ?

## 2021-12-02 NOTE — Progress Notes (Signed)
?Tommi Rumps, MD ?Phone: [phone] ? ?Austin Richards is a 71 y.o. male who presents today for follow-up. ? ?Diabetes: Sugars have been running 130s-140s.  Taking metformin. ? ?Ataxia: Generally stable though does report at times worse than previously.  He has been using a cane.  He requests a handicap placard. ? ?Headache: Patient reports having had a couple of headaches recently.  Notes they occur in his temples.  Notes recently he had a left-sided headache in his temple and noted black spots in his vision when this occurred.  He notes this has not recurred.  He notes the headache lasted for 3 minutes and went away.  Around the same time he also had numbness radiating from his shoulder down to his left thumb.  There is no numbness elsewhere in his left upper extremity.  No numbness in his left lower extremity or his right side.  He notes prior to the onset of the numbness in his arm he had been doing lifting for his sister. ? ?Chest pain: Patient reports an episode of pinching chest discomfort in his upper chest that radiated up into his head.  He was evaluated in clinic and had a reassuring work-up.  He noted that lasted for about 5 minutes and he was sitting on the bed.  He had no diaphoresis.  It has not recurred.  He had been doing a lot of physical activity in the days prior to that helping his sister. ? ?Social History  ? ?Tobacco Use  ?Smoking Status Former  ?Smokeless Tobacco Never  ?Tobacco Comments  ? "smoked in my 16s for a short period of time"  ? ? ?Current Outpatient Medications on File Prior to Visit  ?Medication Sig Dispense Refill  ? ACCU-CHEK GUIDE test strip 1 EACH BY OTHER ROUTE DAILY AT 12 NOON. USE AS INSTRUCTED 100 strip 5  ? Accu-Chek Softclix Lancets lancets USE AS DIRECTED 100 each 1  ? acetaminophen (TYLENOL) 500 MG tablet Take 500 mg by mouth as needed.    ? albuterol (VENTOLIN HFA) 108 (90 Base) MCG/ACT inhaler Inhale into the lungs.    ? amLODipine (NORVASC) 5 MG  tablet TAKE 1/2 TABLET BY MOUTH DAILY 45 tablet 3  ? atorvastatin (LIPITOR) 80 MG tablet TAKE 1 TABLET BY MOUTH EVERY DAY FOR 6PM 90 tablet 1  ? benzonatate (TESSALON) 200 MG capsule Take 1 capsule (200 mg total) by mouth 2 (two) times daily as needed for cough. 20 capsule 0  ? blood glucose meter kit and supplies KIT Dispense based on patient and insurance preference. Use once daily. (FOR ICD-10 E11.9). 1 each 0  ? levofloxacin (LEVAQUIN) 250 MG tablet Take by mouth.    ? metFORMIN (GLUCOPHAGE) 500 MG tablet TAKE 1 TABLET (500 MG TOTAL) BY MOUTH 2 (TWO) TIMES DAILY WITH A MEAL. 180 tablet 3  ? Multiple Vitamins-Minerals (MULTIVITAMIN PO) Take 1 tablet by mouth daily.    ? ?No current facility-administered medications on file prior to visit.  ? ? ? ?ROS see history of present illness ? ?Objective ? ?Physical Exam ?Vitals:  ? 12/02/21 0913  ?BP: 120/70  ?Pulse: 86  ?Temp: 97.8 ?F (36.6 ?C)  ?SpO2: 98%  ? ? ?BP Readings from Last 3 Encounters:  ?12/02/21 120/70  ?10/09/21 127/78  ?09/30/21 110/60  ? ?Wt Readings from Last 3 Encounters:  ?12/02/21 165 lb 9.6 oz (75.1 kg)  ?10/09/21 168 lb (76.2 kg)  ?09/30/21 169 lb (76.7 kg)  ? ? ?Physical Exam ?Constitutional:   ?  General: He is not in acute distress. ?   Appearance: He is not diaphoretic.  ?Cardiovascular:  ?   Rate and Rhythm: Normal rate and regular rhythm.  ?   Heart sounds: Normal heart sounds.  ?Pulmonary:  ?   Effort: Pulmonary effort is normal.  ?   Breath sounds: Normal breath sounds.  ?Skin: ?   General: Skin is warm and dry.  ?Neurological:  ?   Mental Status: He is alert.  ?   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 ? ? ?Assessment/Plan: Please see  individual problem list. ? ?Problem List Items Addressed This Visit   ? ? Ataxia (Chronic)  ?  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  Possibly mildly worse at times.  He will continue to use a walking stick.  Handicap placard forms given to the patient. ?  ?  ? Diabetes ([place]) (Chronic)  ?  A1c previously well controlled.  He will continue metformin 500 mg twice daily. ?  ?  ? Acute nonintractable headache  ?  The patient has had a couple episodes of brief headaches.  Location is concerning for temporal arteritis and thus we will get an ESR today.  It is also possible he could have had a migraine type headache.  We will determine further work-up once the ESR returns. ?  ?  ? Relevant Orders  ? Sedimentation rate  ? Atypical chest pain  ?  The patient had seemingly musculoskeletal chest discomfort.  His symptoms occurred at rest and were described as a pinching sensation which would not be consistent with a cardiac cause.  He had a reassuring chest x-ray.  It does not sound as though it was related to reflux.  Discussed monitoring and if it recurs he would need to be reevaluated. ?  ?  ? Cervical radiculopathy - Primary  ?  Symptoms seem most consistent with cervical radiculopathy.  This may have been related to him lifting things previously.  The numbness only occurred in the radial aspect of his right arm and thumb.  We will get a neck x-ray today.  He will monitor for recurrence and if it does recur he will seek medical attention. ?  ?  ? Relevant Orders  ? DG Cervical Spine Complete  ? ? ? ?Return in about 3 months (around 03/04/2022). ? ?This visit occurred during the SARS-CoV-2 public health emergency.  Safety protocols were in place, including screening questions prior to the visit, additional usage of staff PPE, and extensive cleaning of exam room while observing appropriate contact time as indicated for disinfecting solutions.  ? ? ?Tommi Rumps, MD ?[place] ? ?

## 2021-12-02 NOTE — Assessment & Plan Note (Signed)
The patient had seemingly musculoskeletal chest discomfort.  His symptoms occurred at rest and were described as a pinching sensation which would not be consistent with a cardiac cause.  He had a reassuring chest x-ray.  It does not sound as though it was related to reflux.  Discussed monitoring and if it recurs he would need to be reevaluated. ?

## 2021-12-02 NOTE — Patient Instructions (Signed)
Nice to see you. ?We will get lab work today and contact you with the results. ?If you have recurrence of the chest discomfort you need to be evaluated. ?If you have recurrence of numbness you should be evaluated right away. ?

## 2021-12-02 NOTE — Assessment & Plan Note (Signed)
The patient has had a couple episodes of brief headaches.  Location is concerning for temporal arteritis and thus we will get an ESR today.  It is also possible he could have had a migraine type headache.  We will determine further work-up once the ESR returns. ?

## 2021-12-02 NOTE — Assessment & Plan Note (Signed)
A1c previously well controlled.  He will continue metformin 500 mg twice daily. ?

## 2021-12-04 ENCOUNTER — Encounter: Payer: Self-pay | Admitting: Family Medicine

## 2021-12-11 ENCOUNTER — Telehealth: Payer: Self-pay | Admitting: Family Medicine

## 2021-12-11 ENCOUNTER — Telehealth: Payer: Self-pay

## 2021-12-11 NOTE — Telephone Encounter (Signed)
Lvm for pt to return call to see if they are agreeable in being referred to PT. ?

## 2021-12-11 NOTE — Telephone Encounter (Signed)
Pt sister called stating pt does not want to go to physical therapy ?

## 2021-12-12 NOTE — Telephone Encounter (Signed)
Noted  

## 2021-12-17 DIAGNOSIS — H2513 Age-related nuclear cataract, bilateral: Secondary | ICD-10-CM | POA: Diagnosis not present

## 2021-12-17 LAB — HM DIABETES EYE EXAM

## 2021-12-24 ENCOUNTER — Encounter: Payer: Self-pay | Admitting: *Deleted

## 2021-12-24 ENCOUNTER — Other Ambulatory Visit: Payer: Self-pay | Admitting: Family Medicine

## 2021-12-24 DIAGNOSIS — R7303 Prediabetes: Secondary | ICD-10-CM

## 2022-01-13 DIAGNOSIS — H2511 Age-related nuclear cataract, right eye: Secondary | ICD-10-CM | POA: Diagnosis not present

## 2022-01-15 ENCOUNTER — Other Ambulatory Visit: Payer: Self-pay

## 2022-01-15 ENCOUNTER — Encounter: Payer: Self-pay | Admitting: Ophthalmology

## 2022-01-22 NOTE — Discharge Instructions (Signed)

## 2022-01-26 ENCOUNTER — Ambulatory Visit: Payer: Medicare Other | Admitting: Anesthesiology

## 2022-01-26 ENCOUNTER — Other Ambulatory Visit: Payer: Self-pay

## 2022-01-26 ENCOUNTER — Encounter
Admission: RE | Disposition: A | Discharge status: Home / Self Care | Payer: Self-pay | Source: Home / Self Care | Attending: Ophthalmology

## 2022-01-26 ENCOUNTER — Encounter: Payer: Self-pay | Admitting: Ophthalmology

## 2022-01-26 ENCOUNTER — Ambulatory Visit
Admission: RE | Admit: 2022-01-26 | Discharge: 2022-01-26 | Disposition: A | Discharge status: Home / Self Care | Payer: Medicare Other | Attending: Ophthalmology | Admitting: Ophthalmology

## 2022-01-26 DIAGNOSIS — I129 Hypertensive chronic kidney disease with stage 1 through stage 4 chronic kidney disease, or unspecified chronic kidney disease: Secondary | ICD-10-CM | POA: Insufficient documentation

## 2022-01-26 DIAGNOSIS — E1136 Type 2 diabetes mellitus with diabetic cataract: Secondary | ICD-10-CM | POA: Diagnosis not present

## 2022-01-26 DIAGNOSIS — M1990 Unspecified osteoarthritis, unspecified site: Secondary | ICD-10-CM | POA: Diagnosis not present

## 2022-01-26 DIAGNOSIS — E1122 Type 2 diabetes mellitus with diabetic chronic kidney disease: Secondary | ICD-10-CM | POA: Insufficient documentation

## 2022-01-26 DIAGNOSIS — Z87891 Personal history of nicotine dependence: Secondary | ICD-10-CM | POA: Insufficient documentation

## 2022-01-26 DIAGNOSIS — M5412 Radiculopathy, cervical region: Secondary | ICD-10-CM | POA: Diagnosis not present

## 2022-01-26 DIAGNOSIS — N189 Chronic kidney disease, unspecified: Secondary | ICD-10-CM | POA: Insufficient documentation

## 2022-01-26 DIAGNOSIS — H25811 Combined forms of age-related cataract, right eye: Secondary | ICD-10-CM | POA: Diagnosis not present

## 2022-01-26 DIAGNOSIS — H2511 Age-related nuclear cataract, right eye: Secondary | ICD-10-CM | POA: Diagnosis not present

## 2022-01-26 HISTORY — PX: CATARACT EXTRACTION W/PHACO: SHX586

## 2022-01-26 HISTORY — DX: Essential (primary) hypertension: I10

## 2022-01-26 HISTORY — DX: Unspecified hearing loss, unspecified ear: H91.90

## 2022-01-26 HISTORY — DX: Cervicalgia: M54.2

## 2022-01-26 LAB — GLUCOSE, CAPILLARY
Glucose-Capillary: 114 mg/dL — ABNORMAL HIGH (ref 70–99)
Glucose-Capillary: 129 mg/dL — ABNORMAL HIGH (ref 70–99)

## 2022-01-26 SURGERY — PHACOEMULSIFICATION, CATARACT, WITH IOL INSERTION
Anesthesia: Monitor Anesthesia Care | Site: Eye | Laterality: Right

## 2022-01-26 MED ORDER — TETRACAINE HCL 0.5 % OP SOLN
1.0000 [drp] | OPHTHALMIC | Status: DC | PRN
  Administered 2022-01-26 (×3): 1 [drp] via OPHTHALMIC

## 2022-01-26 MED ORDER — SIGHTPATH DOSE#1 BSS IO SOLN
INTRAOCULAR | Status: DC | PRN
  Administered 2022-01-26: 15 mL

## 2022-01-26 MED ORDER — LIDOCAINE HCL (PF) 2 % IJ SOLN
INTRAOCULAR | Status: DC | PRN
  Administered 2022-01-26: 1 mL via INTRAOCULAR

## 2022-01-26 MED ORDER — FENTANYL CITRATE (PF) 100 MCG/2ML IJ SOLN
INTRAMUSCULAR | Status: DC | PRN
  Administered 2022-01-26: 50 ug via INTRAVENOUS

## 2022-01-26 MED ORDER — ARMC OPHTHALMIC DILATING DROPS
1.0000 "application " | OPHTHALMIC | Status: DC | PRN
  Administered 2022-01-26 (×3): 1 via OPHTHALMIC

## 2022-01-26 MED ORDER — MOXIFLOXACIN HCL 0.5 % OP SOLN
OPHTHALMIC | Status: DC | PRN
  Administered 2022-01-26: 0.2 mL via OPHTHALMIC

## 2022-01-26 MED ORDER — SIGHTPATH DOSE#1 SODIUM HYALURONATE 23 MG/ML IO SOLUTION
PREFILLED_SYRINGE | INTRAOCULAR | Status: DC | PRN
  Administered 2022-01-26: 0.6 mL via INTRAOCULAR

## 2022-01-26 MED ORDER — SIGHTPATH DOSE#1 BSS IO SOLN
INTRAOCULAR | Status: DC | PRN
  Administered 2022-01-26: 67 mL via OPHTHALMIC

## 2022-01-26 MED ORDER — MIDAZOLAM HCL 2 MG/2ML IJ SOLN
INTRAMUSCULAR | Status: DC | PRN
Start: 2022-01-26 — End: 2022-01-26
  Administered 2022-01-26: 2 mg via INTRAVENOUS

## 2022-01-26 MED ORDER — SIGHTPATH DOSE#1 SODIUM HYALURONATE 10 MG/ML IO SOLUTION
PREFILLED_SYRINGE | INTRAOCULAR | Status: DC | PRN
  Administered 2022-01-26: 0.85 mL via INTRAOCULAR

## 2022-01-26 MED ORDER — LACTATED RINGERS IV SOLN: INTRAVENOUS | Status: DC

## 2022-01-26 SURGICAL SUPPLY — 14 items
CATARACT SUITE SIGHTPATH (MISCELLANEOUS) ×2 IMPLANT
DISSECTOR HYDRO NUCLEUS 50X22 (MISCELLANEOUS) ×2 IMPLANT
FEE CATARACT SUITE SIGHTPATH (MISCELLANEOUS) ×1 IMPLANT
GLOVE SURG GAMMEX PI TX LF 7.5 (GLOVE) ×2 IMPLANT
GLOVE SURG SYN 8.5  E (GLOVE) ×1
GLOVE SURG SYN 8.5 E (GLOVE) ×1 IMPLANT
GLOVE SURG SYN 8.5 PF PI (GLOVE) ×1 IMPLANT
LENS IOL TECNIS EYHANCE 23.5 (Intraocular Lens) ×1 IMPLANT
NDL FILTER BLUNT 18X1 1/2 (NEEDLE) ×1 IMPLANT
NEEDLE FILTER BLUNT 18X 1/2SAF (NEEDLE) ×1
NEEDLE FILTER BLUNT 18X1 1/2 (NEEDLE) ×1 IMPLANT
SYR 3ML LL SCALE MARK (SYRINGE) ×2 IMPLANT
SYR 5ML LL (SYRINGE) ×2 IMPLANT
WATER STERILE IRR 250ML POUR (IV SOLUTION) ×2 IMPLANT

## 2022-01-26 NOTE — H&P (Signed)
[place]  ? ?Primary Care Physician:  Leone Haven, MD ?Ophthalmologist: Dr. Benay Pillow ? ?Pre-Procedure History & Physical: ?HPI:  Austin Richards is a 71 y.o. male here for cataract surgery. ?  ?Past Medical History:  ?Diagnosis Date  ? Arthritis   ? Ataxia   ? Chronic kidney disease   ? 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 Deaf   ? wears hearing aides  ? Diabetes mellitus without complication ([place])   ? Type 2  ? Hx of colonic polyp   ? Hyperlipidemia   ? Hypertension   ? Migraine   ? hx of  ? Neck pain   ? sees chiropracter / possible from whiplash in past  ? Pre-diabetes   ? ? ?Past Surgical History:  ?Procedure Laterality Date  ? CHOLECYSTECTOMY    ? 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 Procedure: TOTAL SHOULDER ARTHROPLASTY;  Surgeon: Hiram Gash, MD;  Location: [facility];  Service: Orthopedics;  Laterality: Right;  ? ? ?Prior to Admission medications   ?Medication Sig Start Date End Date Taking? Authorizing Provider  ?ACCU-CHEK GUIDE test strip 1 EACH BY OTHER ROUTE DAILY AT 12 NOON. USE AS INSTRUCTED 10/22/21  Yes Leone Haven, MD  ?Accu-Chek Softclix Lancets lancets USE AS DIRECTED 12/05/20  Yes Leone Haven, MD  ?amLODipine (NORVASC) 5 MG tablet TAKE 1/2 TABLET BY MOUTH DAILY 05/05/21  Yes Leone Haven, MD  ?APPLE CIDER VINEGAR PO Take by mouth.   Yes [provider]  ?atorvastatin (LIPITOR) 80 MG tablet TAKE 1 TABLET BY MOUTH EVERY DAY FOR 6PM 10/21/21  Yes Leone Haven, MD  ?metFORMIN (GLUCOPHAGE) 500 MG tablet TAKE 1 TABLET BY MOUTH 2 TIMES DAILY WITH A MEAL. 12/24/21  Yes Leone Haven, MD  ?Multiple Vitamins-Minerals (MULTIVITAMIN PO) Take 1 tablet by mouth daily.   Yes [provider]  ?Protein POWD Take by mouth.   Yes [provider]  ?acetaminophen (TYLENOL) 500 MG tablet Take 500 mg by mouth as needed.    [provider]  ?albuterol (VENTOLIN HFA) 108 (90 Base) MCG/ACT  inhaler Inhale into the lungs. ?Patient not taking: Reported on 01/15/2022 09/30/20   [provider]  ?benzonatate (TESSALON) 200 MG capsule Take 1 capsule (200 mg total) by mouth 2 (two) times daily as needed for cough. ?Patient not taking: Reported on 01/15/2022 07/30/21   Leone Haven, MD  ?blood glucose meter kit and supplies KIT Dispense based on patient and insurance preference. Use once daily. (FOR ICD-10 E11.9). 05/17/20   Leone Haven, MD  ?levofloxacin (LEVAQUIN) 250 MG tablet Take by mouth. ?Patient not taking: Reported on 01/15/2022 09/30/20   [provider]  ? ? ?Allergies as of 12/22/2021 - Review Complete 12/02/2021  ?Allergen Reaction Noted  ? Codeine Nausea And Vomiting 12/29/2016  ? Penicillins Hives, Nausea And Vomiting, and Other (See Comments) 12/29/2016  ? Latex Itching 10/29/2017  ? ? ?Family History  ?Adopted: Yes  ? ? ?Social History  ? ?Socioeconomic History  ? Marital status: Single  ?  Spouse name: Not on file  ? Number of children: 0  ? Years of education: some college  ? Highest education level: Not on file  ?Occupational History  ? Not on file  ?Tobacco Use  ? Smoking status: Former  ?  Types: Cigarettes  ? Smokeless tobacco: Never  ? Tobacco comments:  ?  "smoked in my 69s  for a short period of time"  ?Vaping Use  ? Vaping Use: Never used  ?Substance and Sexual Activity  ? Alcohol use: Yes  ?  Comment: rarely - social, "heavily in the past"  ? Drug use: No  ? Sexual activity: Not on file  ?Other Topics Concern  ? Not on file  ?Social History Narrative  ? Right-handed.  ? Lives alone (sister is close by).  ? 1 cup caffeine per day.  ? ?Social Determinants of Health  ? ?Financial Resource Strain: Not on file  ?Food Insecurity: Not on file  ?Transportation Needs: Not on file  ?Physical Activity: Not on file  ?Stress: Not on file  ?Social Connections: Not on file  ?Intimate Partner Violence: Not on file  ? ? ?Review of Systems: ?See HPI, otherwise negative  ROS ? ?Physical Exam: ?BP 129/87   Pulse 79   Temp (!) 97.4 ?F (36.3 ?C) (Temporal)   Ht 5' 7.5" (1.715 m)   Wt 75.3 kg   SpO2 97%   BMI 25.60 kg/m?  ?General:   Alert, cooperative in NAD ?Head:  Normocephalic and atraumatic. ?Respiratory:  Normal work of breathing. ?Cardiovascular:  RRR ? ?Impression/Plan: ?Austin Richards is here for cataract surgery. ? ?Risks, benefits, limitations, and alternatives regarding cataract surgery have been reviewed with the patient.  Questions have been answered.  All parties agreeable. ? ? ?Benay Pillow, MD  01/26/2022, 7:52 AM ? ? ?

## 2022-01-26 NOTE — Anesthesia Preprocedure Evaluation (Signed)
Anesthesia Evaluation  ?Patient identified by MRN, date of birth, ID band ?Patient awake ? ? ? ?Reviewed: ?Allergy & Precautions, NPO status , Patient's Chart, lab work & pertinent test results, reviewed documented beta blocker date and time  ? ?History of Anesthesia Complications ?Negative for: history of anesthetic complications ? ?Airway ?Mallampati: III ? ?TM Distance: >3 FB ?Neck ROM: Limited ? ? ? Dental ? ?(+)  ?  ?Pulmonary ?former smoker,  ?  ?breath sounds clear to auscultation ? ? ? ? ? ? Cardiovascular ?hypertension, (-) angina(-) DOE  ?Rhythm:Regular Rate:Normal ? ? ?HLD ?  ?Neuro/Psych ? Headaches,  ?Deaf ? Neuromuscular disease (Cervical radiculopathy)   ? GI/Hepatic ?GERD  Controlled,  ?Endo/Other  ?diabetes, Type 2 ? Renal/GU ?CRFRenal disease  ? ?  ?Musculoskeletal ? ?(+) Arthritis ,  ? Abdominal ?  ?Peds ? Hematology ?  ?Anesthesia Other Findings ? ? Reproductive/Obstetrics ? ?  ? ? ? ? ? ? ? ? ? ? ? ? ? ?  ?  ? ? ? ? ? ? ? ? ?Anesthesia Physical ? ?Anesthesia Plan ? ?ASA: 2 ? ?Anesthesia Plan: MAC  ? ?Post-op Pain Management:   ? ?Induction: Intravenous ? ?PONV Risk Score and Plan: 1 and TIVA, Midazolam and Treatment may vary due to age or medical condition ? ?Airway Management Planned: Nasal Cannula ? ?Additional Equipment:  ? ?Intra-op Plan:  ? ?Post-operative Plan:  ? ?Informed Consent: I have reviewed the patients History and Physical, chart, labs and discussed the procedure including the risks, benefits and alternatives for the proposed anesthesia with the patient or authorized representative who has indicated his/her understanding and acceptance.  ? ? ? ? ? ?Plan Discussed with: CRNA and Anesthesiologist ? ?Anesthesia Plan Comments:   ? ? ? ? ? ? ?Anesthesia Quick Evaluation ? ?

## 2022-01-26 NOTE — Anesthesia Procedure Notes (Signed)
Procedure Name: [place] ?Date/Time: 01/26/2022 8:03 AM ?Performed by: Jeannene Patella, CRNA ?Pre-anesthesia Checklist: Patient identified, Emergency Drugs available, Suction available, Timeout performed and Patient being monitored ?Patient Re-evaluated:Patient Re-evaluated prior to induction ?Oxygen Delivery Method: Nasal cannula ?Placement Confirmation: positive ETCO2 ? ? ? ? ?

## 2022-01-26 NOTE — Transfer of Care (Signed)
Immediate Anesthesia Transfer of Care Note ? ?Patient: Austin Richards ? ?Procedure(s) Performed: CATARACT EXTRACTION PHACO AND INTRAOCULAR LENS PLACEMENT (IOC) RIGHT DIABETIC 5.90 00:35.7 (Right: Eye) ? ?Patient Location: PACU ? ?Anesthesia Type: MAC ? ?Level of Consciousness: awake, alert  and patient cooperative ? ?Airway and Oxygen Therapy: Patient Spontanous Breathing and Patient connected to supplemental oxygen ? ?Post-op Assessment: Post-op Vital signs reviewed, Patient's Cardiovascular Status Stable, Respiratory Function Stable, Patent Airway and No signs of Nausea or vomiting ? ?Post-op Vital Signs: Reviewed and stable ? ?Complications: No notable events documented. ? ?

## 2022-01-26 NOTE — Op Note (Signed)
OPERATIVE NOTE ? ?[place] ?[id number] ?01/26/2022 ? ? ?PREOPERATIVE DIAGNOSIS:  Nuclear sclerotic cataract right eye.  H25.11 ?  ?POSTOPERATIVE DIAGNOSIS:    Nuclear sclerotic cataract right eye.   ?  ?PROCEDURE:  Phacoemusification with posterior chamber intraocular lens placement of the right eye  ? ?LENS:   ?Implant Name Type Inv. Item Serial No. Manufacturer Lot No. LRB No. Used Action  ?LENS IOL TECNIS EYHANCE 23.5 - [id number] Intraocular Lens LENS IOL TECNIS EYHANCE 23.5 [id number] [employer]  Right 1 Implanted  ?    ? ?Procedure(s): ?CATARACT EXTRACTION PHACO AND INTRAOCULAR LENS PLACEMENT (IOC) RIGHT DIABETIC 5.90 00:35.7 (Right) ? ?DIB00 +23.5 ?  ?ULTRASOUND TIME: 0 minutes 35 seconds.  CDE 5.90 ?  ?SURGEON:  Benay Pillow, MD, MPH ? ?ANESTHESIOLOGIST: Anesthesiologist: Heniser, Fredric Dine, MD ?CRNA: Jeannene Patella, CRNA ?  ?ANESTHESIA:  Topical with tetracaine drops augmented with 1% preservative-free intracameral lidocaine. ? ?ESTIMATED BLOOD LOSS: less than 1 mL. ?  ?COMPLICATIONS:  None. ?  ?DESCRIPTION OF PROCEDURE:  The patient was identified in the holding room and transported to the operating room and placed in the supine position under the operating microscope.  The right eye was identified as the operative eye and it was prepped and draped in the usual sterile ophthalmic fashion. ?  ?A 1.0 millimeter clear-corneal paracentesis was made at the 10:30 position. 0.5 ml of preservative-free 1% lidocaine with epinephrine was injected into the anterior chamber. ? The anterior chamber was filled with Healon 5 viscoelastic.  A 2.4 millimeter keratome was used to make a near-clear corneal incision at the 8:00 position.  A curvilinear capsulorrhexis was made with a cystotome and capsulorrhexis forceps.  Balanced salt solution was used to hydrodissect and hydrodelineate the nucleus. ?  ?Phacoemulsification was then used in stop and chop fashion to remove the lens nucleus and epinucleus.  The  remaining cortex was then removed using the irrigation and aspiration handpiece. Healon was then placed into the capsular bag to distend it for lens placement.  A lens was then injected into the capsular bag.  The remaining viscoelastic was aspirated. ?  ?Wounds were hydrated with balanced salt solution.  The anterior chamber was inflated to a physiologic pressure with balanced salt solution.  ? ?Intracameral vigamox 0.1 mL undiluted was injected into the eye and a drop placed onto the ocular surface. ? ?No wound leaks were noted.  The patient was taken to the recovery room in stable condition without complications of anesthesia or surgery ? ?Benay Pillow ?01/26/2022, 8:19 AM ? ?

## 2022-01-26 NOTE — Anesthesia Postprocedure Evaluation (Signed)
Anesthesia Post Note ? ?Patient: Austin Richards ? ?Procedure(s) Performed: CATARACT EXTRACTION PHACO AND INTRAOCULAR LENS PLACEMENT (IOC) RIGHT DIABETIC 5.90 00:35.7 (Right: Eye) ? ? ?  ?Patient location during evaluation: PACU ?Anesthesia Type: MAC ?Level of consciousness: awake and alert ?Pain management: pain level controlled ?Vital Signs Assessment: post-procedure vital signs reviewed and stable ?Respiratory status: spontaneous breathing, nonlabored ventilation, respiratory function stable and patient connected to nasal cannula oxygen ?Cardiovascular status: stable and blood pressure returned to baseline ?Postop Assessment: no apparent nausea or vomiting ?Anesthetic complications: no ? ? ?No notable events documented. ? ?Quintan Saldivar A  Elonda Giuliano ? ? ? ? ? ?

## 2022-01-27 ENCOUNTER — Encounter: Payer: Self-pay | Admitting: Ophthalmology

## 2022-01-30 IMAGING — DX DG CHEST 2V
2 series · 2 of 2 positions shown · non-contrast
Comparison: October 28, 2020

CLINICAL DATA: Chest pain.

EXAM:
CHEST - 2 VIEW

[chest pa]
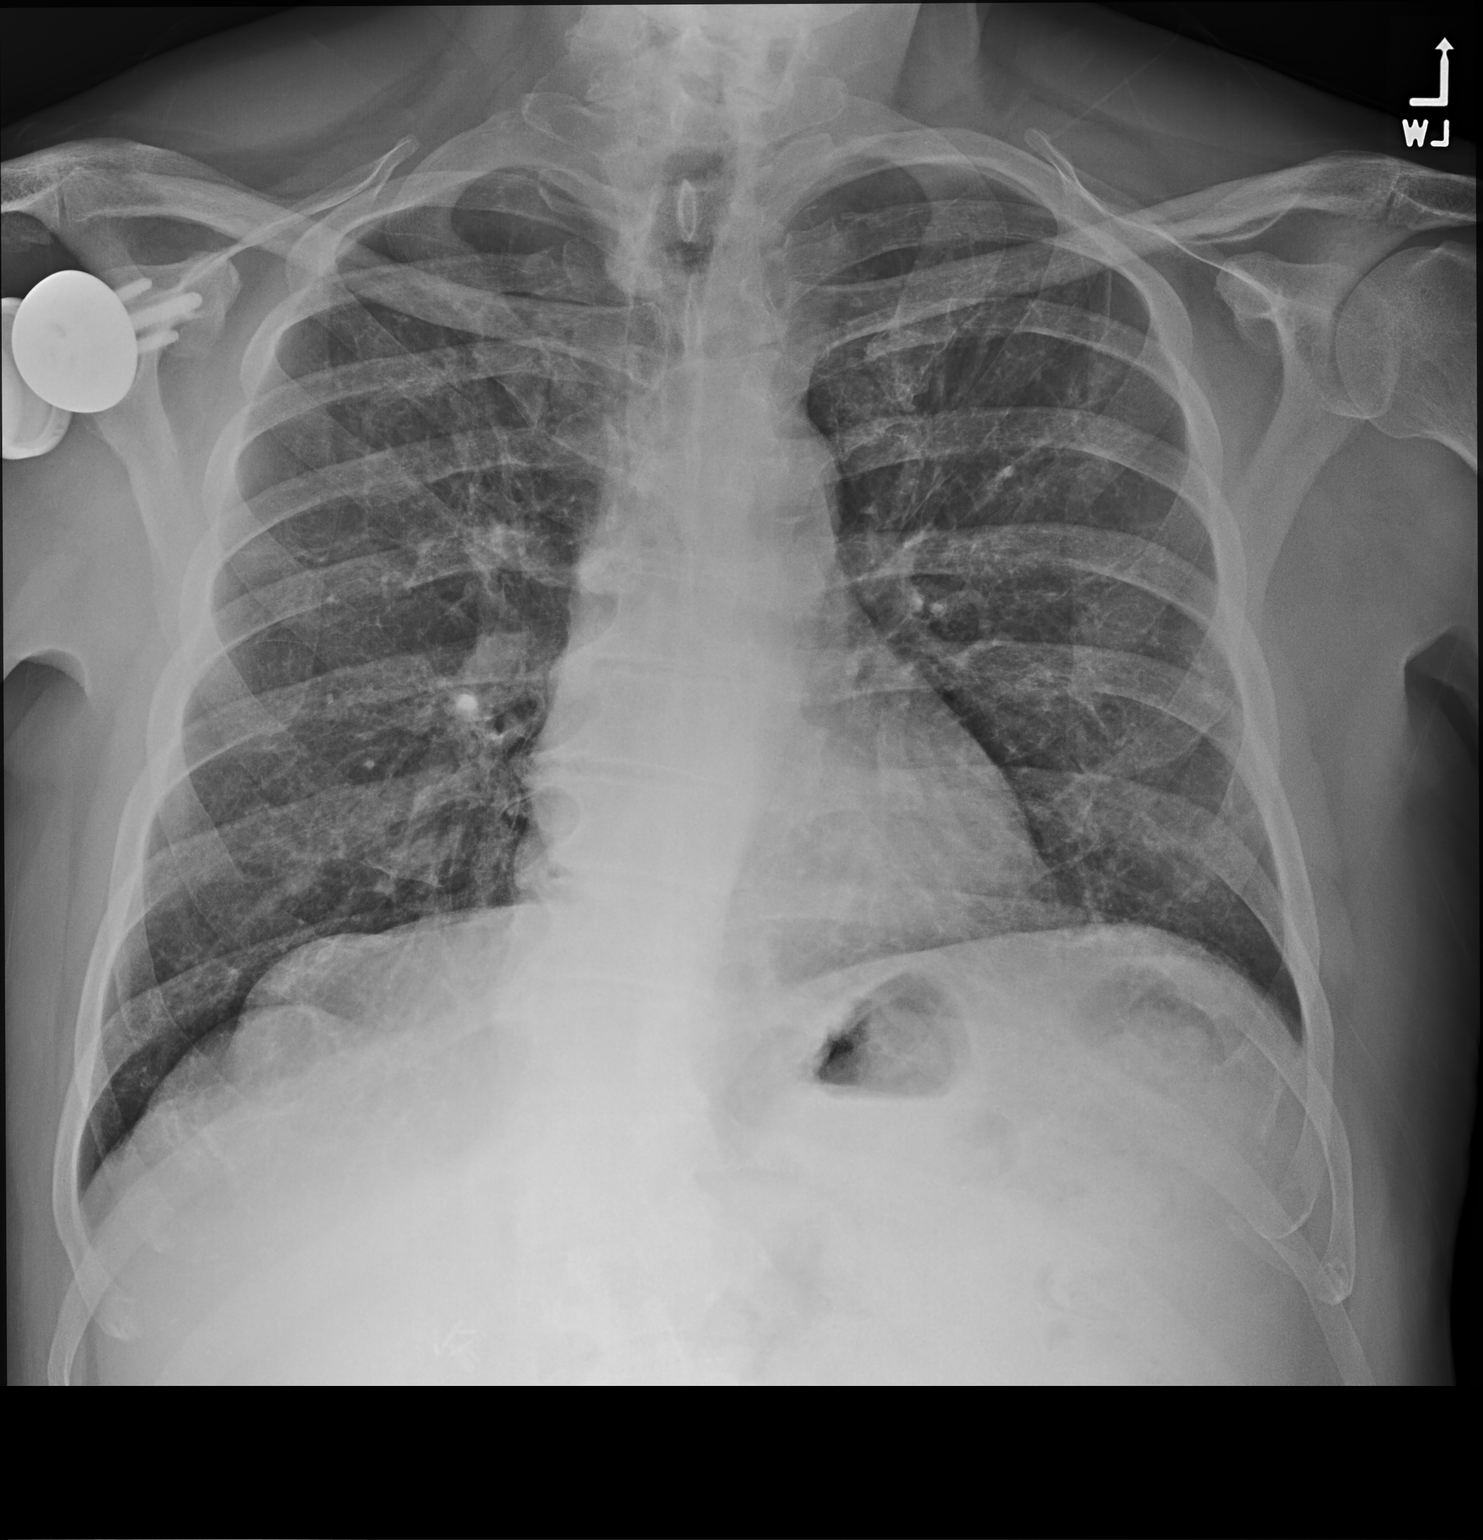

[chest lat]
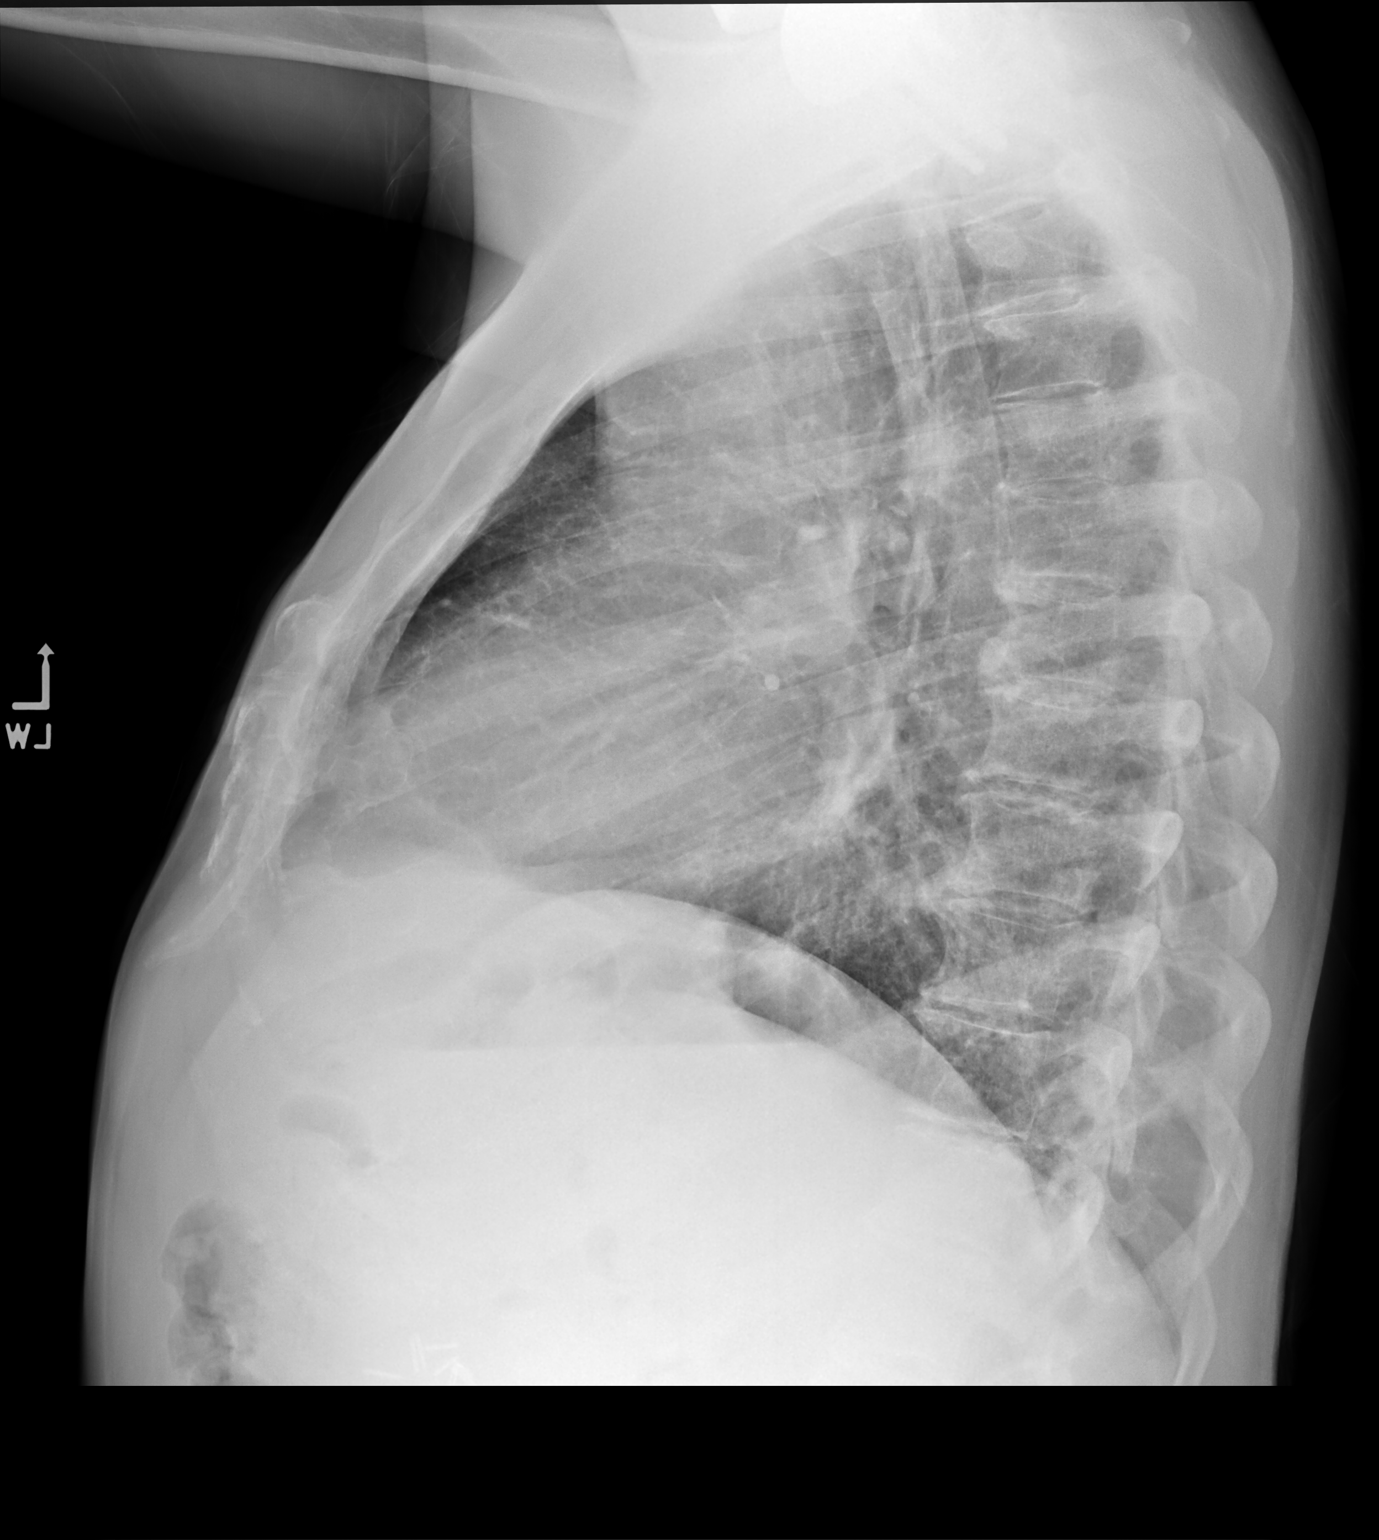

[2 of 2 positions shown; findings below may reference images not displayed]

FINDINGS: Mild, diffuse, chronic appearing increased lung markings are seen.
There is no evidence of acute infiltrate, pleural effusion or
pneumothorax. The heart size and mediastinal contours are within
normal limits. Radiopaque surgical clips are seen within the right
upper quadrant. A right shoulder replacement is noted. Multilevel
degenerative changes seen throughout the thoracic spine.
IMPRESSION: No active cardiopulmonary disease.

## 2022-02-02 ENCOUNTER — Encounter: Payer: Self-pay | Admitting: Ophthalmology

## 2022-02-05 NOTE — Discharge Instructions (Signed)

## 2022-02-09 ENCOUNTER — Ambulatory Visit
Admission: RE | Admit: 2022-02-09 | Discharge: 2022-02-09 | Disposition: A | Discharge status: Home / Self Care | Payer: Medicare Other | Attending: Ophthalmology | Admitting: Ophthalmology

## 2022-02-09 ENCOUNTER — Encounter: Payer: Self-pay | Admitting: Ophthalmology

## 2022-02-09 ENCOUNTER — Ambulatory Visit: Payer: Medicare Other | Admitting: Anesthesiology

## 2022-02-09 ENCOUNTER — Encounter
Admission: RE | Disposition: A | Discharge status: Home / Self Care | Payer: Self-pay | Source: Home / Self Care | Attending: Ophthalmology

## 2022-02-09 ENCOUNTER — Other Ambulatory Visit: Payer: Self-pay

## 2022-02-09 DIAGNOSIS — E1122 Type 2 diabetes mellitus with diabetic chronic kidney disease: Secondary | ICD-10-CM | POA: Insufficient documentation

## 2022-02-09 DIAGNOSIS — N189 Chronic kidney disease, unspecified: Secondary | ICD-10-CM | POA: Insufficient documentation

## 2022-02-09 DIAGNOSIS — K219 Gastro-esophageal reflux disease without esophagitis: Secondary | ICD-10-CM | POA: Diagnosis not present

## 2022-02-09 DIAGNOSIS — H2512 Age-related nuclear cataract, left eye: Secondary | ICD-10-CM | POA: Diagnosis not present

## 2022-02-09 DIAGNOSIS — E1136 Type 2 diabetes mellitus with diabetic cataract: Secondary | ICD-10-CM | POA: Diagnosis not present

## 2022-02-09 DIAGNOSIS — H25812 Combined forms of age-related cataract, left eye: Secondary | ICD-10-CM | POA: Diagnosis not present

## 2022-02-09 DIAGNOSIS — I129 Hypertensive chronic kidney disease with stage 1 through stage 4 chronic kidney disease, or unspecified chronic kidney disease: Secondary | ICD-10-CM | POA: Insufficient documentation

## 2022-02-09 DIAGNOSIS — R269 Unspecified abnormalities of gait and mobility: Secondary | ICD-10-CM

## 2022-02-09 DIAGNOSIS — Z87891 Personal history of nicotine dependence: Secondary | ICD-10-CM | POA: Insufficient documentation

## 2022-02-09 HISTORY — PX: CATARACT EXTRACTION W/PHACO: SHX586

## 2022-02-09 LAB — GLUCOSE, CAPILLARY
Glucose-Capillary: 119 mg/dL — ABNORMAL HIGH (ref 70–99)
Glucose-Capillary: 128 mg/dL — ABNORMAL HIGH (ref 70–99)

## 2022-02-09 SURGERY — PHACOEMULSIFICATION, CATARACT, WITH IOL INSERTION
Anesthesia: Monitor Anesthesia Care | Site: Eye | Laterality: Left

## 2022-02-09 MED ORDER — LACTATED RINGERS IV SOLN: INTRAVENOUS | Status: DC

## 2022-02-09 MED ORDER — CYCLOPENTOLATE HCL 2 % OP SOLN
1.0000 [drp] | OPHTHALMIC | Status: AC | PRN
  Administered 2022-02-09 (×3): 1 [drp] via OPHTHALMIC

## 2022-02-09 MED ORDER — ACETAMINOPHEN 325 MG PO TABS: 650.0000 mg | ORAL_TABLET | ORAL | Status: DC | PRN

## 2022-02-09 MED ORDER — BALANCED SALT IO SOLN
INTRAOCULAR | Status: DC | PRN
  Administered 2022-02-09: 1 mL via INTRAOCULAR

## 2022-02-09 MED ORDER — ACETAMINOPHEN 160 MG/5ML PO SOLN: 325.0000 mg | ORAL | Status: DC | PRN

## 2022-02-09 MED ORDER — SIGHTPATH DOSE#1 SODIUM HYALURONATE 23 MG/ML IO SOLUTION
PREFILLED_SYRINGE | INTRAOCULAR | Status: DC | PRN
  Administered 2022-02-09: 0.6 mL via INTRAOCULAR

## 2022-02-09 MED ORDER — MIDAZOLAM HCL 2 MG/2ML IJ SOLN
INTRAMUSCULAR | Status: DC | PRN
  Administered 2022-02-09: 1 mg via INTRAVENOUS

## 2022-02-09 MED ORDER — SIGHTPATH DOSE#1 BSS IO SOLN
INTRAOCULAR | Status: DC | PRN
Start: 2022-02-09 — End: 2022-02-09
  Administered 2022-02-09: 15 mL

## 2022-02-09 MED ORDER — TETRACAINE HCL 0.5 % OP SOLN
1.0000 [drp] | OPHTHALMIC | Status: DC | PRN
  Administered 2022-02-09 (×3): 1 [drp] via OPHTHALMIC

## 2022-02-09 MED ORDER — PHENYLEPHRINE HCL 10 % OP SOLN
1.0000 [drp] | OPHTHALMIC | Status: AC | PRN
Start: 2022-02-09 — End: 2022-02-09
  Administered 2022-02-09 (×3): 1 [drp] via OPHTHALMIC

## 2022-02-09 MED ORDER — SIGHTPATH DOSE#1 BSS IO SOLN
INTRAOCULAR | Status: DC | PRN
  Administered 2022-02-09: 61 mL via OPHTHALMIC

## 2022-02-09 MED ORDER — SIGHTPATH DOSE#1 SODIUM HYALURONATE 10 MG/ML IO SOLUTION
PREFILLED_SYRINGE | INTRAOCULAR | Status: DC | PRN
  Administered 2022-02-09: 0.85 mL via INTRAOCULAR

## 2022-02-09 MED ORDER — ONDANSETRON HCL 4 MG/2ML IJ SOLN: 4.0000 mg | Freq: Once | INTRAMUSCULAR | Status: DC | PRN

## 2022-02-09 MED ORDER — MOXIFLOXACIN HCL 0.5 % OP SOLN
OPHTHALMIC | Status: DC | PRN
  Administered 2022-02-09: 0.2 mL via OPHTHALMIC

## 2022-02-09 MED ORDER — FENTANYL CITRATE (PF) 100 MCG/2ML IJ SOLN
INTRAMUSCULAR | Status: DC | PRN
  Administered 2022-02-09: 50 ug via INTRAVENOUS

## 2022-02-09 SURGICAL SUPPLY — 14 items
CATARACT SUITE SIGHTPATH (MISCELLANEOUS) ×2 IMPLANT
DISSECTOR HYDRO NUCLEUS 50X22 (MISCELLANEOUS) ×2 IMPLANT
FEE CATARACT SUITE SIGHTPATH (MISCELLANEOUS) ×1 IMPLANT
GLOVE SURG GAMMEX PI TX LF 7.5 (GLOVE) ×2 IMPLANT
GLOVE SURG SYN 8.5  E (GLOVE) ×1
GLOVE SURG SYN 8.5 E (GLOVE) ×1 IMPLANT
GLOVE SURG SYN 8.5 PF PI (GLOVE) ×1 IMPLANT
LENS IOL TECNIS EYHANCE 24.0 (Intraocular Lens) ×1 IMPLANT
NDL FILTER BLUNT 18X1 1/2 (NEEDLE) ×1 IMPLANT
NEEDLE FILTER BLUNT 18X 1/2SAF (NEEDLE) ×1
NEEDLE FILTER BLUNT 18X1 1/2 (NEEDLE) ×1 IMPLANT
SYR 3ML LL SCALE MARK (SYRINGE) ×2 IMPLANT
SYR 5ML LL (SYRINGE) ×2 IMPLANT
WATER STERILE IRR 250ML POUR (IV SOLUTION) ×2 IMPLANT

## 2022-02-09 NOTE — Transfer of Care (Signed)
Immediate Anesthesia Transfer of Care Note ? ?Patient: Austin Richards ? ?Procedure(s) Performed: CATARACT EXTRACTION PHACO AND INTRAOCULAR LENS PLACEMENT (IOC) LEFT 3.70 00:23.5 (Left: Eye) ? ?Patient Location: PACU ? ?Anesthesia Type: MAC ? ?Level of Consciousness: awake, alert  and patient cooperative ? ?Airway and Oxygen Therapy: Patient Spontanous Breathing and Patient connected to supplemental oxygen ? ?Post-op Assessment: Post-op Vital signs reviewed, Patient's Cardiovascular Status Stable, Respiratory Function Stable, Patent Airway and No signs of Nausea or vomiting ? ?Post-op Vital Signs: Reviewed and stable ? ?Complications: No notable events documented. ? ?

## 2022-02-09 NOTE — H&P (Signed)
Austin Richards  ? ?Primary Care Physician:  Austin Haven, MD ?Ophthalmologist: Dr. Benay Pillow ? ?Pre-Procedure History & Physical: ?HPI:  Austin Richards is a 71 y.o. male here for cataract surgery. ?  ?Past Medical History:  ?Diagnosis Date  ? Arthritis   ? Ataxia   ? Chronic kidney disease   ? 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 COVID-19 08/27/2020  ? Deaf   ? wears hearing aides  ? Diabetes mellitus without complication ([place])   ? Type 2  ? Hx of colonic polyp   ? Hyperlipidemia   ? Hypertension   ? Migraine   ? hx of  ? Neck pain   ? sees chiropracter / possible from whiplash in past  ? Pre-diabetes   ? ? ?Past Surgical History:  ?Procedure Laterality Date  ? CATARACT EXTRACTION W/PHACO Right 01/26/2022  ? Procedure: CATARACT EXTRACTION PHACO AND INTRAOCULAR LENS PLACEMENT (IOC) RIGHT DIABETIC 5.90 00:35.7;  Surgeon: Eulogio Bear, MD;  Location: [facility];  Service: Ophthalmology;  Laterality: Right;  ? CHOLECYSTECTOMY    ? 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 Procedure: TOTAL SHOULDER ARTHROPLASTY;  Surgeon: Hiram Gash, MD;  Location: [facility];  Service: Orthopedics;  Laterality: Right;  ? ? ?Prior to Admission medications   ?Medication Sig Start Date End Date Taking? Authorizing Provider  ?ACCU-CHEK GUIDE test strip 1 EACH BY OTHER ROUTE DAILY AT 12 NOON. USE AS INSTRUCTED 10/22/21  Yes Austin Haven, MD  ?Accu-Chek Softclix Lancets lancets USE AS DIRECTED 12/05/20  Yes Austin Haven, MD  ?acetaminophen (TYLENOL) 500 MG tablet Take 500 mg by mouth as needed.   Yes [provider]  ?amLODipine (NORVASC) 5 MG tablet TAKE 1/2 TABLET BY MOUTH DAILY ?Patient taking differently: Take 2.5 mg by mouth daily. 05/05/21  Yes Austin Haven, MD  ?APPLE CIDER VINEGAR PO Take by mouth.   Yes [provider]  ?atorvastatin (LIPITOR) 80 MG tablet TAKE 1 TABLET BY MOUTH EVERY DAY FOR 6PM 10/21/21  Yes Austin Haven, MD   ?blood glucose meter kit and supplies KIT Dispense based on patient and insurance preference. Use once daily. (FOR ICD-10 E11.9). 05/17/20  Yes Austin Haven, MD  ?Cyanocobalamin (VITAMIN B-12 PO) Take by mouth.   Yes [provider]  ?metFORMIN (GLUCOPHAGE) 500 MG tablet TAKE 1 TABLET BY MOUTH 2 TIMES DAILY WITH A MEAL. 12/24/21  Yes Austin Haven, MD  ?Multiple Vitamins-Minerals (MULTIVITAMIN PO) Take 1 tablet by mouth daily.   Yes [provider]  ?Omega-3 Fatty Acids (FISH OIL PO) Take by mouth.   Yes [provider]  ?Protein POWD Take by mouth.   Yes [provider]  ? ? ?Allergies as of 12/22/2021 - Review Complete 12/02/2021  ?Allergen Reaction Noted  ? Codeine Nausea And Vomiting 12/29/2016  ? Penicillins Hives, Nausea And Vomiting, and Other (See Comments) 12/29/2016  ? Latex Itching 10/29/2017  ? ? ?Family History  ?Adopted: Yes  ? ? ?Social History  ? ?Socioeconomic History  ? Marital status: Single  ?  Spouse name: Not on file  ? Number of children: 0  ? Years of education: some college  ? Highest education level: Not on file  ?Occupational History  ? Not on file  ?Tobacco Use  ? Smoking status: Former  ?  Types: Cigarettes  ? Smokeless tobacco: Never  ? Tobacco comments:  ?  "smoked in my  20s for a short period of time"  ?Vaping Use  ? Vaping Use: Never used  ?Substance and Sexual Activity  ? Alcohol use: Yes  ?  Comment: rarely - social, "heavily in the past"  ? Drug use: No  ? Sexual activity: Not on file  ?Other Topics Concern  ? Not on file  ?Social History Narrative  ? Right-handed.  ? Lives alone (sister is close by).  ? 1 cup caffeine per day.  ? ?Social Determinants of Health  ? ?Financial Resource Strain: Not on file  ?Food Insecurity: Not on file  ?Transportation Needs: Not on file  ?Physical Activity: Not on file  ?Stress: Not on file  ?Social Connections: Not on file  ?Intimate Partner Violence: Not on file  ? ? ?Review of Systems: ?See HPI,  otherwise negative ROS ? ?Physical Exam: ?BP (!) 151/71   Pulse 80   Resp 18   Ht 5' 7.5" (1.715 m)   Wt 74.8 kg   SpO2 98%   BMI 25.46 kg/m?  ?General:   Alert, cooperative in NAD ?Head:  Normocephalic and atraumatic. ?Respiratory:  Normal work of breathing. ?Cardiovascular:  RRR ? ?Impression/Plan: ?Austin Richards is here for cataract surgery. ? ?Risks, benefits, limitations, and alternatives regarding cataract surgery have been reviewed with the patient.  Questions have been answered.  All parties agreeable. ? ? ?Benay Pillow, MD  02/09/2022, 10:28 AM ? ? ?

## 2022-02-09 NOTE — Anesthesia Procedure Notes (Signed)
Procedure Name: [place] ?Date/Time: 02/09/2022 10:48 AM ?Performed by: Cameron Ali, CRNA ?Pre-anesthesia Checklist: Patient identified, Emergency Drugs available, Suction available, Timeout performed and Patient being monitored ?Patient Re-evaluated:Patient Re-evaluated prior to induction ?Oxygen Delivery Method: Nasal cannula ?Placement Confirmation: positive ETCO2 ? ? ? ? ?

## 2022-02-09 NOTE — Anesthesia Preprocedure Evaluation (Signed)
Anesthesia Evaluation  ?Patient identified by MRN, date of birth, ID band ?Patient awake ? ? ? ?Reviewed: ?Allergy & Precautions, NPO status , Patient's Chart, lab work & pertinent test results, reviewed documented beta blocker date and time  ? ?History of Anesthesia Complications ?Negative for: history of anesthetic complications ? ?Airway ?Mallampati: III ? ?TM Distance: >3 FB ?Neck ROM: Limited ? ? ? Dental ? ?(+)  ?  ?Pulmonary ?former smoker,  ?  ?breath sounds clear to auscultation ? ? ? ? ? ? Cardiovascular ?hypertension, (-) angina(-) DOE  ?Rhythm:Regular Rate:Normal ? ? ?HLD ?  ?Neuro/Psych ? Headaches,  ?Deaf ? Neuromuscular disease (Cervical radiculopathy)   ? GI/Hepatic ?GERD  Controlled,  ?Endo/Other  ?diabetes, Type 2 ? Renal/GU ?CRFRenal disease  ? ?  ?Musculoskeletal ? ?(+) Arthritis ,  ? Abdominal ?  ?Peds ? Hematology ?  ?Anesthesia Other Findings ? ? Reproductive/Obstetrics ? ?  ? ? ? ? ? ? ? ? ? ? ? ? ? ?  ?  ? ? ? ? ? ? ? ? ?Anesthesia Physical ? ?Anesthesia Plan ? ?ASA: 2 ? ?Anesthesia Plan: MAC  ? ?Post-op Pain Management:   ? ?Induction: Intravenous ? ?PONV Risk Score and Plan: 1 and TIVA, Midazolam and Treatment may vary due to age or medical condition ? ?Airway Management Planned: Nasal Cannula ? ?Additional Equipment:  ? ?Intra-op Plan:  ? ?Post-operative Plan:  ? ?Informed Consent: I have reviewed the patients History and Physical, chart, labs and discussed the procedure including the risks, benefits and alternatives for the proposed anesthesia with the patient or authorized representative who has indicated his/her understanding and acceptance.  ? ? ? ? ? ?Plan Discussed with: CRNA and Anesthesiologist ? ?Anesthesia Plan Comments:   ? ? ? ? ? ? ?Anesthesia Quick Evaluation ? ?

## 2022-02-09 NOTE — Anesthesia Postprocedure Evaluation (Signed)
Anesthesia Post Note ? ?Patient: Austin Richards ? ?Procedure(s) Performed: CATARACT EXTRACTION PHACO AND INTRAOCULAR LENS PLACEMENT (IOC) LEFT 3.70 00:23.5 (Left: Eye) ? ? ?  ?Patient location during evaluation: PACU ?Anesthesia Type: MAC ?Level of consciousness: awake and alert ?Pain management: pain level controlled ?Vital Signs Assessment: post-procedure vital signs reviewed and stable ?Respiratory status: spontaneous breathing, nonlabored ventilation, respiratory function stable and patient connected to nasal cannula oxygen ?Cardiovascular status: stable and blood pressure returned to baseline ?Postop Assessment: no apparent nausea or vomiting ?Anesthetic complications: no ? ? ?No notable events documented. ? ?Kali Ambler A  Ivyonna Hoelzel ? ? ? ? ? ?

## 2022-02-09 NOTE — Op Note (Signed)
OPERATIVE NOTE ? ?[place] ?[id number] ?02/09/2022 ? ? ?PREOPERATIVE DIAGNOSIS:  Nuclear sclerotic cataract left eye.  H25.12 ?  ?POSTOPERATIVE DIAGNOSIS:    Nuclear sclerotic cataract left eye.   ?  ?PROCEDURE:  Phacoemusification with posterior chamber intraocular lens placement of the left eye  ? ?LENS:   ?Implant Name Type Inv. Item Serial No. Manufacturer Lot No. LRB No. Used Action  ?LENS IOL TECNIS EYHANCE 24.0 - [id number] Intraocular Lens LENS IOL TECNIS EYHANCE 24.0 [id number] [employer]  Left 1 Implanted  ?    ?Procedure(s) with comments: ?CATARACT EXTRACTION PHACO AND INTRAOCULAR LENS PLACEMENT (IOC) LEFT (Left) - Diabetic ? ?DIB00 +24.0 ?  ?ULTRASOUND TIME: 0 minutes 23.5 seconds.  CDE 3.70 ?  ?SURGEON:  Benay Pillow, MD, MPH ?  ?ANESTHESIA:  Topical with tetracaine drops augmented with 1% preservative-free intracameral lidocaine. ? ?ESTIMATED BLOOD LOSS: <1 mL ?  ?COMPLICATIONS:  None. ?  ?DESCRIPTION OF PROCEDURE:  The patient was identified in the holding room and transported to the operating room and placed in the supine position under the operating microscope.  The left eye was identified as the operative eye and it was prepped and draped in the usual sterile ophthalmic fashion. ?  ?A 1.0 millimeter clear-corneal paracentesis was made at the 5:00 position. 0.5 ml of preservative-free 1% lidocaine with epinephrine was injected into the anterior chamber. ? The anterior chamber was filled with Healon 5 viscoelastic.  A 2.4 millimeter keratome was used to make a near-clear corneal incision at the 2:00 position.  A curvilinear capsulorrhexis was made with a cystotome and capsulorrhexis forceps.  Balanced salt solution was used to hydrodissect and hydrodelineate the nucleus. ?  ?Phacoemulsification was then used in stop and chop fashion to remove the lens nucleus and epinucleus.  The remaining cortex was then removed using the irrigation and aspiration handpiece. Healon was then placed into  the capsular bag to distend it for lens placement.  A lens was then injected into the capsular bag.  The remaining viscoelastic was aspirated. ?  ?Wounds were hydrated with balanced salt solution.  The anterior chamber was inflated to a physiologic pressure with balanced salt solution. ? ?Intracameral vigamox 0.1 mL undiltued was injected into the eye and a drop placed onto the ocular surface. ? No wound leaks were noted.  The patient was taken to the recovery room in stable condition without complications of anesthesia or surgery ? ?Benay Pillow ?02/09/2022, 10:56 AM ? ?

## 2022-03-09 ENCOUNTER — Ambulatory Visit: Payer: Medicare Other | Admitting: Family Medicine

## 2022-03-18 ENCOUNTER — Ambulatory Visit (INDEPENDENT_AMBULATORY_CARE_PROVIDER_SITE_OTHER): Payer: Medicare Other | Admitting: Family Medicine

## 2022-03-18 ENCOUNTER — Encounter: Payer: Self-pay | Admitting: Family Medicine

## 2022-03-18 DIAGNOSIS — R5383 Other fatigue: Secondary | ICD-10-CM | POA: Insufficient documentation

## 2022-03-18 DIAGNOSIS — Z794 Long term (current) use of insulin: Secondary | ICD-10-CM

## 2022-03-18 DIAGNOSIS — J4520 Mild intermittent asthma, uncomplicated: Secondary | ICD-10-CM | POA: Diagnosis not present

## 2022-03-18 DIAGNOSIS — I10 Essential (primary) hypertension: Secondary | ICD-10-CM

## 2022-03-18 DIAGNOSIS — E782 Mixed hyperlipidemia: Secondary | ICD-10-CM

## 2022-03-18 DIAGNOSIS — E119 Type 2 diabetes mellitus without complications: Secondary | ICD-10-CM

## 2022-03-18 DIAGNOSIS — J45909 Unspecified asthma, uncomplicated: Secondary | ICD-10-CM | POA: Insufficient documentation

## 2022-03-18 MED ORDER — ALBUTEROL SULFATE HFA 108 (90 BASE) MCG/ACT IN AERS: 2.0000 | INHALATION_SPRAY | Freq: Four times a day (QID) | RESPIRATORY_TRACT | 0 refills | Status: DC | PRN

## 2022-03-18 NOTE — Assessment & Plan Note (Signed)
Possibly post-COVID fatigue.  We will check lab work to evaluate for other causes.

## 2022-03-18 NOTE — Assessment & Plan Note (Signed)
Adequately controlled.  He will continue amlodipine 2.5 mg daily.

## 2022-03-18 NOTE — Assessment & Plan Note (Signed)
Check A1c.  Continue metformin 500 mg twice daily. 

## 2022-03-18 NOTE — Assessment & Plan Note (Signed)
Refill albuterol.  I suspect his mild intermittent dyspnea is reactive airway disease related.  He reports having done PFTs in the distant past.  He can continue as needed use of albuterol.

## 2022-03-18 NOTE — Assessment & Plan Note (Signed)
Check lipid panel.  Continue Lipitor 80 mg daily. 

## 2022-03-18 NOTE — Patient Instructions (Signed)
Nice to see you. We will check lab work today and contact you with the results. 

## 2022-03-18 NOTE — Progress Notes (Signed)
Tommi Rumps, MD Phone: [phone]  Austin Richards is a 71 y.o. male who presents today for f/u  DIABETES Disease Monitoring: Blood Sugar ranges-122-133 Polyuria/phagia/dipsia- no      Optho- UTD Medications: Compliance- taking metformin Hypoglycemic symptoms- no  HYPERTENSION Disease Monitoring Home BP Monitoring similar to today Chest pain- no    Dyspnea- notes chronic stable dyspnea that occurs infrequently, none when exercising, notes this has been an issue since he was a child.  Responds well to albuterol. Medications Compliance-  taking amlodipine.  Edema- no BMET    Component Value Date/Time   NA 138 09/30/2021 1611   K 4.4 09/30/2021 1611   CL 100 09/30/2021 1611   CO2 27 09/30/2021 1611   GLUCOSE 118 (H) 09/30/2021 1611   BUN 19 09/30/2021 1611   CREATININE 1.27 09/30/2021 1611   CALCIUM 9.2 09/30/2021 1611   GFRNONAA 51 (L) 10/29/2017 1350   GFRAA 59 (L) 10/29/2017 1350   HYPERLIPIDEMIA Symptoms Chest pain on exertion:  no   Medications: Compliance- taking lipitor Right upper quadrant pain- no  Muscle aches- no Lipid Panel     Component Value Date/Time   CHOL 106 08/16/2020 1132   TRIG 347.0 (H) 08/16/2020 1132   HDL 29.40 (L) 08/16/2020 1132   CHOLHDL 4 08/16/2020 1132   VLDL 69.4 (H) 08/16/2020 1132   LDLDIRECT 44.0 08/16/2020 1132   Tiredness: Patient notes tiredness and fatigue since having COVID.  Notes it does not occur all the time.  He wonders about taking any supplements that may help with this.   Social History   Tobacco Use  Smoking Status Former   Types: Cigarettes  Smokeless Tobacco Never  Tobacco Comments   "smoked in my 73s for a short period of time"    Current Outpatient Medications on File Prior to Visit  Medication Sig Dispense Refill   ACCU-CHEK GUIDE test strip 1 EACH BY OTHER ROUTE DAILY AT 12 NOON. USE AS INSTRUCTED 100 strip 5   Accu-Chek Softclix Lancets lancets USE AS DIRECTED 100 each 1   acetaminophen  (TYLENOL) 500 MG tablet Take 500 mg by mouth as needed.     amLODipine (NORVASC) 5 MG tablet TAKE 1/2 TABLET BY MOUTH DAILY (Patient taking differently: Take 2.5 mg by mouth daily.) 45 tablet 3   APPLE CIDER VINEGAR PO Take by mouth.     atorvastatin (LIPITOR) 80 MG tablet TAKE 1 TABLET BY MOUTH EVERY DAY FOR 6PM 90 tablet 1   blood glucose meter kit and supplies KIT Dispense based on patient and insurance preference. Use once daily. (FOR ICD-10 E11.9). 1 each 0   Cyanocobalamin (VITAMIN B-12 PO) Take by mouth.     metFORMIN (GLUCOPHAGE) 500 MG tablet TAKE 1 TABLET BY MOUTH 2 TIMES DAILY WITH A MEAL. 180 tablet 3   Multiple Vitamins-Minerals (MULTIVITAMIN PO) Take 1 tablet by mouth daily.     Omega-3 Fatty Acids (FISH OIL PO) Take by mouth.     Protein POWD Take by mouth.     No current facility-administered medications on file prior to visit.     ROS see history of present illness  Objective  Physical Exam Vitals:   03/18/22 1547  BP: 115/80  Pulse: 87  Temp: 98.1 F (36.7 C)  SpO2: 99%    BP Readings from Last 3 Encounters:  03/18/22 115/80  02/09/22 (!) 153/84  01/26/22 130/73   Wt Readings from Last 3 Encounters:  03/18/22 170 lb 9.6 oz (77.4 kg)  02/09/22  165 lb (74.8 kg)  01/26/22 165 lb 14.4 oz (75.3 kg)    Physical Exam Constitutional:      General: He is not in acute distress.    Appearance: He is not diaphoretic.  Cardiovascular:     Rate and Rhythm: Normal rate and regular rhythm.     Heart sounds: Normal heart sounds.  Pulmonary:     Effort: Pulmonary effort is normal.     Breath sounds: Normal breath sounds.  Musculoskeletal:     Right lower leg: No edema.     Left lower leg: No edema.  Skin:    General: Skin is warm and dry.  Neurological:     Mental Status: He is alert.      Assessment/Plan: Please see individual problem list.  Problem List Items Addressed This Visit     Diabetes ([place]) (Chronic)    Check A1c.  Continue metformin 500 mg  twice daily.      Relevant Orders   Comp Met (CMET)   HgB A1c   Hyperlipidemia (Chronic)    Check lipid panel.  Continue Lipitor 80 mg daily.      Relevant Orders   Comp Met (CMET)   Lipid panel   Hypertension (Chronic)    Adequately controlled.  He will continue amlodipine 2.5 mg daily.      Relevant Orders   Comp Met (CMET)   Fatigue    Possibly post-COVID fatigue.  We will check lab work to evaluate for other causes.      Relevant Orders   Comp Met (CMET)   CBC   TSH   B12   Reactive airway disease    Refill albuterol.  I suspect his mild intermittent dyspnea is reactive airway disease related.  He reports having done PFTs in the distant past.  He can continue as needed use of albuterol.      Relevant Medications   albuterol (VENTOLIN HFA) 108 (90 Base) MCG/ACT inhaler    Return in about 3 months (around 06/18/2022) for Diabetes.   Tommi Rumps, MD [place]

## 2022-03-19 LAB — COMPREHENSIVE METABOLIC PANEL
ALT: 36 U/L (ref 0–53)
AST: 21 U/L (ref 0–37)
Albumin: 4.3 g/dL (ref 3.5–5.2)
Alkaline Phosphatase: 107 U/L (ref 39–117)
BUN: 20 mg/dL (ref 6–23)
CO2: 27 mEq/L (ref 19–32)
Calcium: 9 mg/dL (ref 8.4–10.5)
Chloride: 104 mEq/L (ref 96–112)
Creatinine, Ser: 1.22 mg/dL (ref 0.40–1.50)
GFR: 60.45 mL/min (ref >60.00)
Glucose, Bld: 115 mg/dL — ABNORMAL HIGH (ref 70–99)
Potassium: 4.2 mEq/L (ref 3.5–5.1)
Sodium: 139 mEq/L (ref 135–145)
Total Bilirubin: 1 mg/dL (ref 0.2–1.2)
Total Protein: 6.7 g/dL (ref 6.0–8.3)

## 2022-03-19 LAB — CBC
HCT: 45.6 % (ref 39.0–52.0)
Hemoglobin: 15.1 g/dL (ref 13.0–17.0)
MCHC: 33 g/dL (ref 30.0–36.0)
MCV: 89.2 fl (ref 78.0–100.0)
Platelets: 268 10*3/uL (ref 150.0–400.0)
RBC: 5.11 Mil/uL (ref 4.22–5.81)
RDW: 14.3 % (ref 11.5–15.5)
WBC: 8.2 10*3/uL (ref 4.0–10.5)

## 2022-03-19 LAB — LIPID PANEL
Cholesterol: 127 mg/dL (ref 0–200)
HDL: 33.9 mg/dL — ABNORMAL LOW (ref >39.00)
NonHDL: 92.8
Total CHOL/HDL Ratio: 4
Triglycerides: 257 mg/dL — ABNORMAL HIGH (ref 0.0–149.0)
VLDL: 51.4 mg/dL — ABNORMAL HIGH (ref 0.0–40.0)

## 2022-03-19 LAB — HEMOGLOBIN A1C: Hgb A1c MFr Bld: 6.5 % (ref 4.6–6.5)

## 2022-03-19 LAB — TSH: TSH: 1.56 u[IU]/mL (ref 0.35–5.50)

## 2022-03-19 LAB — LDL CHOLESTEROL, DIRECT: Direct LDL: 49 mg/dL

## 2022-03-19 LAB — VITAMIN B12: Vitamin B-12: 726 pg/mL (ref 211–911)

## 2022-04-03 IMAGING — DX DG CERVICAL SPINE COMPLETE 4+V
6 series · 6 of 6 positions shown · non-contrast
Comparison: None.

CLINICAL DATA: Neck pain with numbness along radial aspect of left
hand.

EXAM:
CERVICAL SPINE - COMPLETE 4+ VIEW

[cervical spine ap]
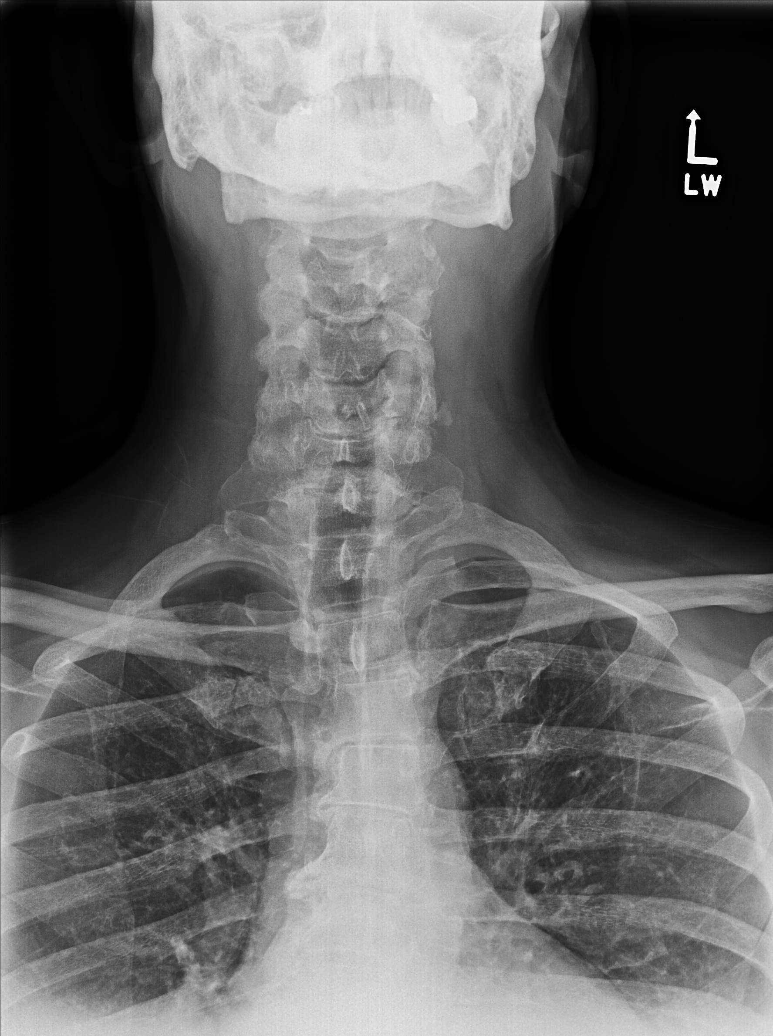

[cervical spine oblique (1 of 2)]
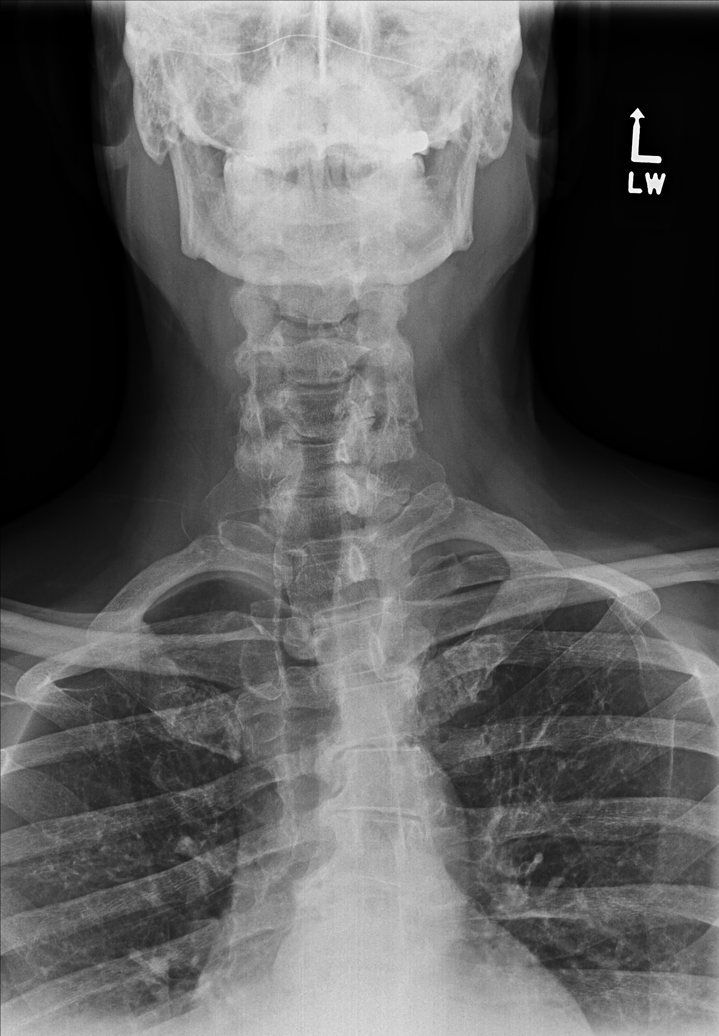

[cervical spine oblique (2 of 2)]
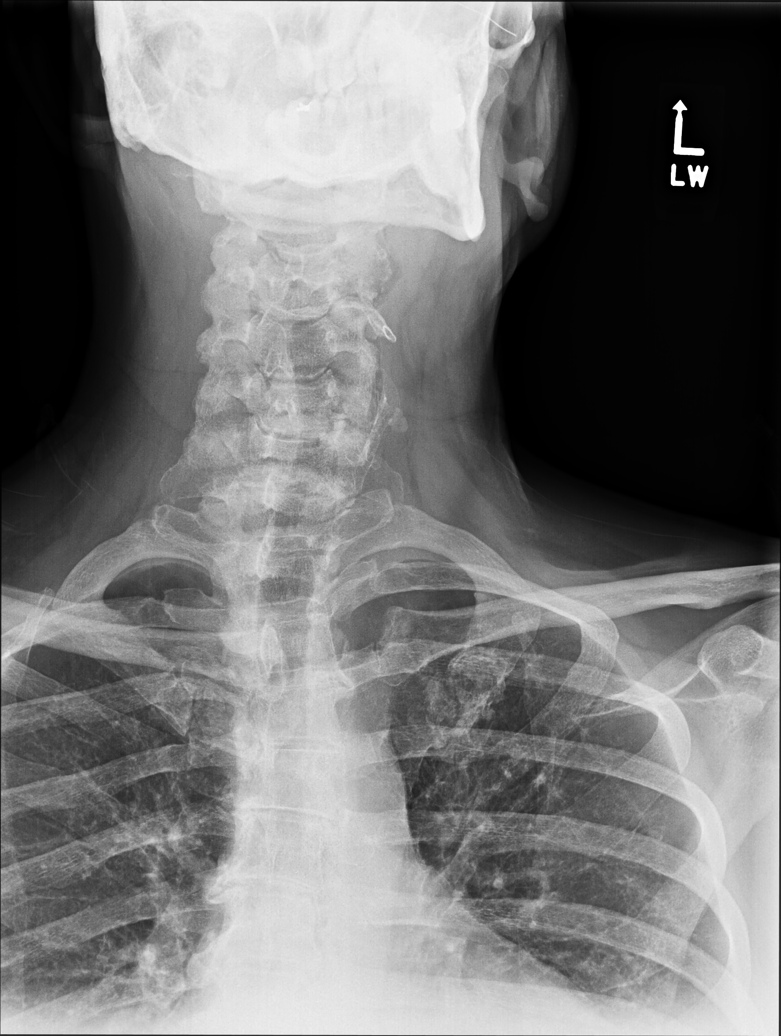

[cervical spine lat]
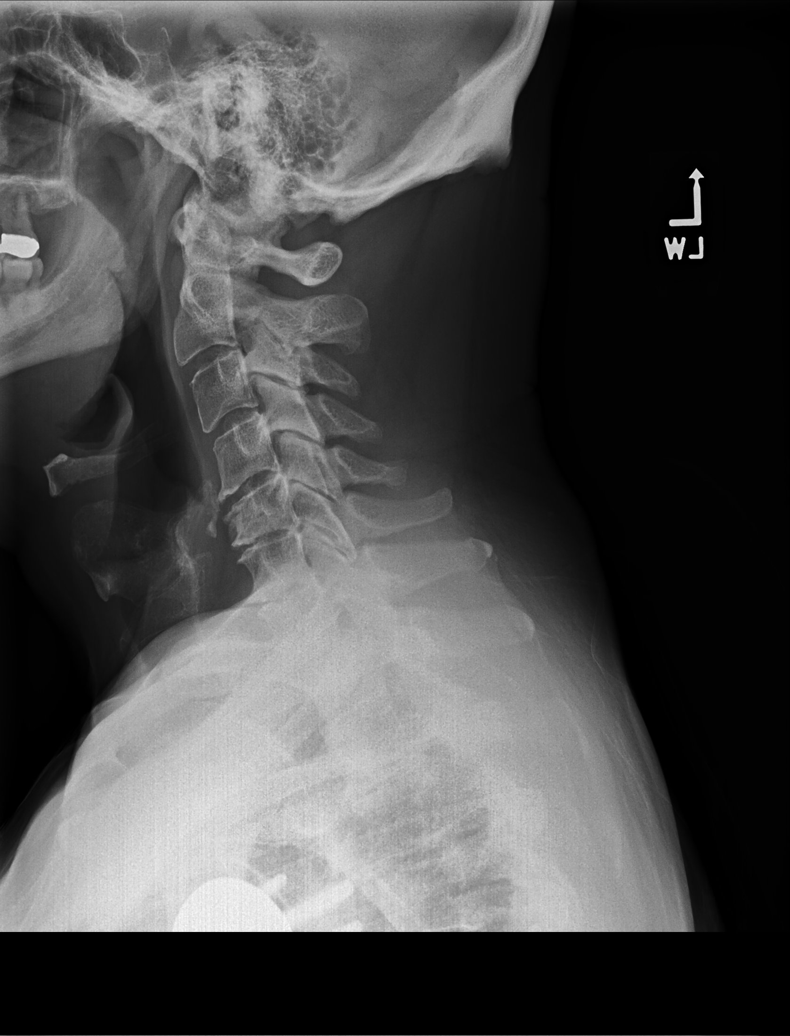

[swimmers lat]
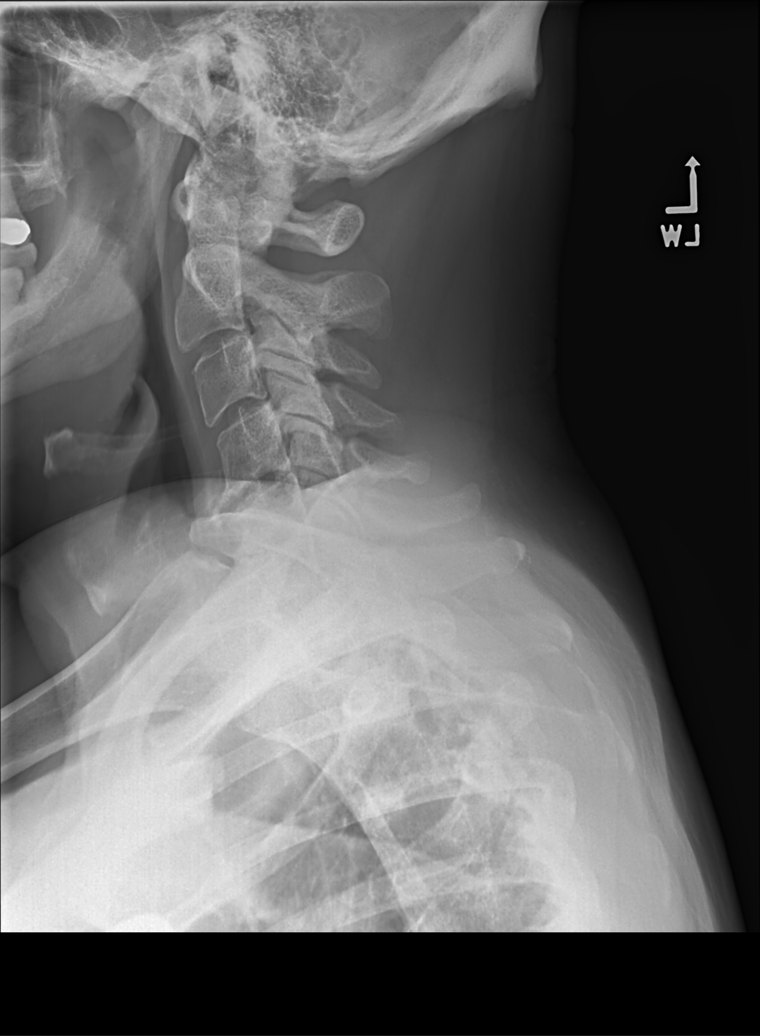

[cervical spine open mouth ap]
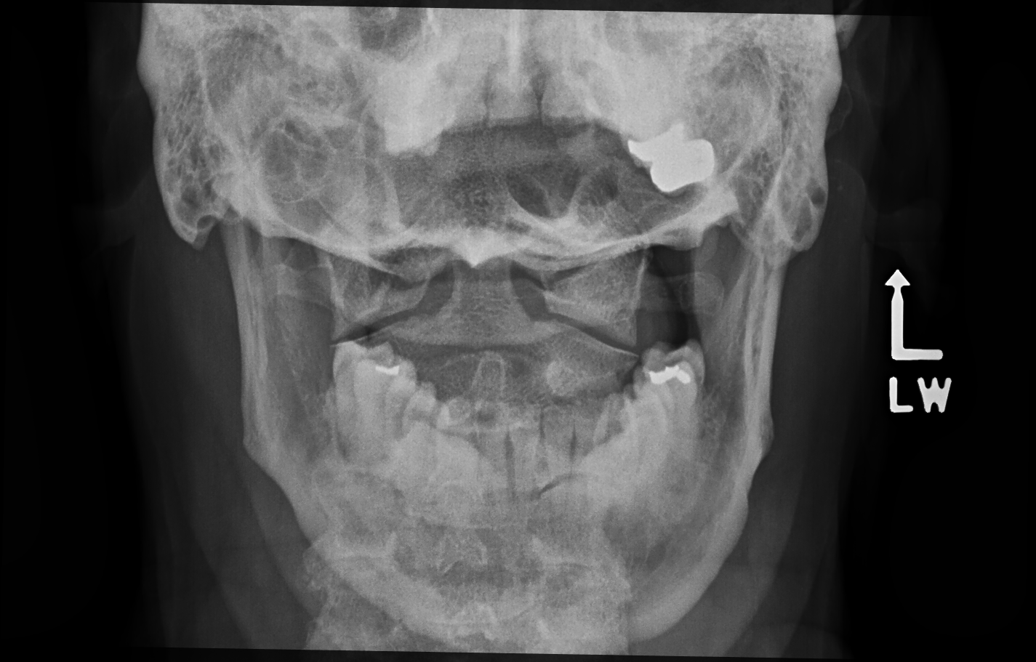

[6 of 6 positions shown; findings below may reference images not displayed]

FINDINGS: There is straightening of the normal cervical lordosis. No sagittal
spondylolisthesis. Minimal anterior C5 vertebral body height loss
with moderate C5-6 and mild C4-5 disc space narrowing. Moderate
anterior C4-5 and C5-6 endplate osteophytosis. Moderate to severe
C6-7 disc space narrowing and moderate anterior endplate
osteophytes.

The atlantodens interval is intact. No prevertebral soft tissue
swelling. Partial visualization of shoulder arthroplasty hardware.
Mild dextrocurvature of the midthoracic spine, partially visualized.
IMPRESSION: Moderate to severe C6-7, moderate C5-6, and mild C4-5 degenerative
disc and endplate changes.

## 2022-04-07 ENCOUNTER — Other Ambulatory Visit (INDEPENDENT_AMBULATORY_CARE_PROVIDER_SITE_OTHER): Payer: Self-pay | Admitting: Nurse Practitioner

## 2022-04-07 DIAGNOSIS — I739 Peripheral vascular disease, unspecified: Secondary | ICD-10-CM

## 2022-04-08 ENCOUNTER — Encounter (INDEPENDENT_AMBULATORY_CARE_PROVIDER_SITE_OTHER): Payer: Self-pay | Admitting: Nurse Practitioner

## 2022-04-08 ENCOUNTER — Ambulatory Visit (INDEPENDENT_AMBULATORY_CARE_PROVIDER_SITE_OTHER): Payer: Medicare Other

## 2022-04-08 ENCOUNTER — Ambulatory Visit (INDEPENDENT_AMBULATORY_CARE_PROVIDER_SITE_OTHER): Payer: Medicare Other | Admitting: Nurse Practitioner

## 2022-04-08 VITALS — BP 129/72 | HR 88 | Resp 16 | Wt 164.4 lb

## 2022-04-08 DIAGNOSIS — E782 Mixed hyperlipidemia: Secondary | ICD-10-CM | POA: Diagnosis not present

## 2022-04-08 DIAGNOSIS — I739 Peripheral vascular disease, unspecified: Secondary | ICD-10-CM | POA: Diagnosis not present

## 2022-04-08 DIAGNOSIS — E119 Type 2 diabetes mellitus without complications: Secondary | ICD-10-CM | POA: Diagnosis not present

## 2022-04-08 DIAGNOSIS — Z794 Long term (current) use of insulin: Secondary | ICD-10-CM

## 2022-04-08 DIAGNOSIS — I10 Essential (primary) hypertension: Secondary | ICD-10-CM | POA: Diagnosis not present

## 2022-04-08 NOTE — Progress Notes (Incomplete)
Subjective:    Patient ID: Mancos, male    DOB: Mar 17, 1951, 71 y.o.   MRN: [id number] Chief Complaint  Patient presents with  . Follow-up    Ultrasound follow up    Austin Richards is a 71 year old male that returns today for follow-up evaluation of his peripheral arterial disease.  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  The patient notes that this causes him to focus on his balance.  He does note left-sided hip pain but is not consistent.  He notes that he is able to remain fairly active by going out working in his garden.  He denies any open wounds or ulcerations.  Denies any symptoms that are consistent with anything like breast pain.  His previous ABI was 1.0 on the right and 0.6 on the left.  Previous waveforms suggesting inflow disease.  Today noninvasive studies show an ABI of 0.93 on the right and 1.00 on the left.  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     Review of Systems  HENT:  Negative for hearing loss.   Cardiovascular:  Negative for leg swelling.  Skin:  Negative for wound.  All other systems reviewed and are negative.      Objective:   Physical Exam Vitals reviewed.  HENT:     Head: Normocephalic.  Cardiovascular:     Rate and Rhythm: Normal rate.  Pulmonary:     Effort: Pulmonary effort is normal.  Skin:    General: Skin is warm and dry.  Neurological:     Mental Status: He is alert and oriented to person, place, and time.  Psychiatric:        Mood and Affect: Mood normal.        Behavior: Behavior normal.        Thought Content: Thought content normal.        Judgment: Judgment normal.     BP 129/72 (BP Location: Right Arm)   Pulse 88   Resp 16   Wt 164 lb 6.4 oz (74.6 kg)   BMI 25.75 kg/m   Past Medical History:  Diagnosis Date  . Arthritis   . Ataxia   . 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 COVID-19 08/27/2020  . Deaf    wears hearing aides  . Diabetes mellitus without complication (HCC)    Type 2  . Hx of colonic polyp   . Hyperlipidemia   . Hypertension   . Migraine    hx of  . Neck pain    sees chiropracter / possible from whiplash in past  . Pre-diabetes     Social History   Socioeconomic History  . Marital status: Single    Spouse name: Not on file  . Number of children: 0  . Years of education: some college  . Highest education level: Not on file  Occupational History  . Not on file  Tobacco Use  . Smoking status: Former    Types: Cigarettes  . Smokeless tobacco: Never  . Tobacco comments:    "smoked in my 27s for a short period of time"  Vaping Use  . Vaping Use: Never used  Substance and Sexual Activity  . Alcohol use: Yes    Comment: rarely - social, "heavily in the past"  . Drug  use: No  . Sexual activity: Not on file  Other Topics Concern  . Not on file  Social History Narrative   Right-handed.   Lives alone (sister is close by).   1 cup caffeine per day.   Social Determinants of Health   Financial Resource Strain: Not on file  Food Insecurity: Not on file  Transportation Needs: Not on file  Physical Activity: Not on file  Stress: Not on file  Social Connections: Not on file  Intimate Partner Violence: Not on file    Past Surgical History:  Procedure Laterality Date  . CATARACT EXTRACTION W/PHACO Right 01/26/2022   Procedure: CATARACT EXTRACTION PHACO AND INTRAOCULAR LENS PLACEMENT (IOC) RIGHT DIABETIC 5.90 00:35.7;  Surgeon: Eulogio Bear, MD;  Location: [facility];  Service: Ophthalmology;  Laterality: Right;  . CATARACT EXTRACTION W/PHACO Left 02/09/2022   Procedure: CATARACT EXTRACTION PHACO AND INTRAOCULAR LENS PLACEMENT (IOC) LEFT 3.70 00:23.5;  Surgeon: Eulogio Bear, MD;  Location: [facility];  Service: Ophthalmology;  Laterality: Left;   Diabetic  . CHOLECYSTECTOMY    . TOTAL SHOULDER ARTHROPLASTY Right 11/10/2017   Procedure: TOTAL SHOULDER ARTHROPLASTY;  Surgeon: Hiram Gash, MD;  Location: [facility];  Service: Orthopedics;  Laterality: Right;    Family History  Adopted: Yes    Allergies  Allergen Reactions  . Codeine Nausea And Vomiting  . Penicillins Hives, Nausea And Vomiting and Other (See Comments)    Has patient had a PCN reaction causing immediate rash, facial/tongue/throat swelling, SOB or lightheadedness with hypotension: Yes Has patient had a PCN reaction causing severe rash involving mucus membranes or skin necrosis: No Has patient had a PCN reaction that required hospitalization: Yes Has patient had a PCN reaction occurring within the last 10 years: No If all of the above answers are "NO", then may proceed with Cephalosporin use.   . Latex Itching    Itching (gloves - when worn)       Latest Ref Rng & Units 03/18/2022    4:26 PM 09/30/2021    4:11 PM 07/03/2019   10:51 AM  CBC  WBC 4.0 - 10.5 K/uL 8.2  9.6  7.0   Hemoglobin 13.0 - 17.0 g/dL 15.1  15.3  16.0   Hematocrit 39.0 - 52.0 % 45.6  44.6  46.2   Platelets 150.0 - 400.0 K/uL 268.0  280.0  279.0       CMP     Component Value Date/Time   NA 139 03/18/2022 1626   K 4.2 03/18/2022 1626   CL 104 03/18/2022 1626   CO2 27 03/18/2022 1626   GLUCOSE 115 (H) 03/18/2022 1626   BUN 20 03/18/2022 1626   CREATININE 1.22 03/18/2022 1626   CALCIUM 9.0 03/18/2022 1626   PROT 6.7 03/18/2022 1626   ALBUMIN 4.3 03/18/2022 1626   AST 21 03/18/2022 1626   ALT 36 03/18/2022 1626   ALKPHOS 107 03/18/2022 1626   BILITOT 1.0 03/18/2022 1626   GFRNONAA 51 (L) 10/29/2017 1350   GFRAA 59 (L) 10/29/2017 1350     No results found.     Assessment & Plan:   1. PAD (peripheral artery disease) (HCC) ***  2. Mixed hyperlipidemia ***  3. Type 2 diabetes mellitus without complication, with long-term current use of insulin (HCC) ***  4. Primary  hypertension ***   Current Outpatient Medications on File Prior to Visit  Medication Sig Dispense Refill  . ACCU-CHEK GUIDE test strip 1 EACH BY OTHER ROUTE DAILY AT  12 NOON. USE AS INSTRUCTED 100 strip 5  . Accu-Chek Softclix Lancets lancets USE AS DIRECTED 100 each 1  . acetaminophen (TYLENOL) 500 MG tablet Take 500 mg by mouth as needed.    [name] Kitchen albuterol (VENTOLIN HFA) 108 (90 Base) MCG/ACT inhaler Inhale 2 puffs into the lungs every 6 (six) hours as needed for wheezing or shortness of breath. 8 g 0  . amLODipine (NORVASC) 5 MG tablet TAKE 1/2 TABLET BY MOUTH DAILY (Patient taking differently: Take 2.5 mg by mouth daily.) 45 tablet 3  . APPLE CIDER VINEGAR PO Take by mouth.    [name] Kitchen atorvastatin (LIPITOR) 80 MG tablet TAKE 1 TABLET BY MOUTH EVERY DAY FOR 6PM 90 tablet 1  . blood glucose meter kit and supplies KIT Dispense based on patient and insurance preference. Use once daily. (FOR ICD-10 E11.9). 1 each 0  . Cyanocobalamin (VITAMIN B-12 PO) Take by mouth.    . metFORMIN (GLUCOPHAGE) 500 MG tablet TAKE 1 TABLET BY MOUTH 2 TIMES DAILY WITH A MEAL. 180 tablet 3  . Multiple Vitamins-Minerals (MULTIVITAMIN PO) Take 1 tablet by mouth daily.    . Omega-3 Fatty Acids (FISH OIL PO) Take by mouth.    . Protein POWD Take by mouth.     No current facility-administered medications on file prior to visit.    There are no Patient Instructions on file for this visit. No follow-ups on file.   Kris Hartmann, NP

## 2022-04-08 NOTE — Progress Notes (Signed)
Subjective:    Patient ID: Austin Richards, male    DOB: 1951/03/04, 71 y.o.   MRN: [id number] Chief Complaint  Patient presents with   Follow-up    Ultrasound follow up    Austin Richards is a 71 year old male that returns today for follow-up evaluation of his peripheral arterial disease.  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  The patient notes that this causes him to focus on his balance.  He does note left-sided hip pain but is not consistent.  He notes that he is able to remain fairly active by going out working in his garden.  He denies any open wounds or ulcerations.  Denies any symptoms that are consistent with anything like breast pain.  His previous ABI was 1.0 on the right and 0.6 on the left.  Previous waveforms suggesting inflow disease.  Today noninvasive studies show an ABI of 0.93 on the right and 1.00 on the left.  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     Review of Systems  HENT:  Negative for hearing loss.   Cardiovascular:  Negative for leg swelling.  Skin:  Negative for wound.  All other systems reviewed and are negative.      Objective:   Physical Exam Vitals reviewed.  HENT:     Head: Normocephalic.  Cardiovascular:     Rate and Rhythm: Normal rate.     Pulses:          Dorsalis pedis pulses are 1+ on the right side and 1+ on the left side.  Pulmonary:     Effort: Pulmonary effort is normal.  Skin:    General: Skin is warm and dry.  Neurological:     Mental Status: He is alert and oriented to person, place, and time.  Psychiatric:        Mood and Affect: Mood normal.        Behavior: Behavior normal.        Thought Content: Thought content normal.        Judgment: Judgment normal.     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   COVID-74 08/27/2020   Deaf    wears hearing aides   Diabetes mellitus without complication ([place])    Type 2   Hx of colonic polyp    Hyperlipidemia    Hypertension    Migraine    hx of   Neck pain    sees chiropracter / possible from whiplash in past   Pre-diabetes     Social History   Socioeconomic History   Marital status: Single    Spouse name: Not on file   Number of children: 0   Years of education: some college   Highest education level: Not on file  Occupational History   Not on file  Tobacco Use   Smoking status: Former    Types: Cigarettes   Smokeless tobacco: Never   Tobacco comments:    "smoked in my 56s for a short period of time"  Vaping Use  Vaping Use: Never used  Substance and Sexual Activity   Alcohol use: Yes    Comment: rarely - social, "heavily in the past"   Drug use: No   Sexual activity: Not on file  Other Topics Concern   Not on file  Social History Narrative   Right-handed.   Lives alone (sister is close by).   1 cup caffeine per day.   Social Determinants of Health   Financial Resource Strain: Not on file  Food Insecurity: Not on file  Transportation Needs: Not on file  Physical Activity: Not on file  Stress: Not on file  Social Connections: Not on file  Intimate Partner Violence: Not on file    Past Surgical History:  Procedure Laterality Date   CATARACT EXTRACTION W/PHACO Right 01/26/2022   Procedure: CATARACT EXTRACTION PHACO AND INTRAOCULAR LENS PLACEMENT ([place]) RIGHT DIABETIC 5.90 00:35.7;  Surgeon: Eulogio Bear, MD;  Location: [facility];  Service: Ophthalmology;  Laterality: Right;   CATARACT EXTRACTION W/PHACO Left 02/09/2022   Procedure: CATARACT EXTRACTION PHACO AND INTRAOCULAR LENS PLACEMENT (IOC) LEFT 3.70 00:23.5;  Surgeon: Eulogio Bear, MD;  Location:  [facility];  Service: Ophthalmology;  Laterality: Left;  Diabetic   CHOLECYSTECTOMY     TOTAL SHOULDER ARTHROPLASTY Right 11/10/2017   Procedure: TOTAL SHOULDER ARTHROPLASTY;  Surgeon: Hiram Gash, MD;  Location: [facility];  Service: Orthopedics;  Laterality: Right;    Family History  Adopted: Yes    Allergies  Allergen Reactions   Codeine Nausea And Vomiting   Penicillins Hives, Nausea And Vomiting and Other (See Comments)    Has patient had a PCN reaction causing immediate rash, facial/tongue/throat swelling, SOB or lightheadedness with hypotension: Yes Has patient had a PCN reaction causing severe rash involving mucus membranes or skin necrosis: No Has patient had a PCN reaction that required hospitalization: Yes Has patient had a PCN reaction occurring within the last 10 years: No If all of the above answers are "NO", then may proceed with Cephalosporin use.    Latex Itching    Itching (gloves - when worn)       Latest Ref Rng & Units 03/18/2022    4:26 PM 09/30/2021    4:11 PM 07/03/2019   10:51 AM  CBC  WBC 4.0 - 10.5 K/uL 8.2  9.6  7.0   Hemoglobin 13.0 - 17.0 g/dL 15.1  15.3  16.0   Hematocrit 39.0 - 52.0 % 45.6  44.6  46.2   Platelets 150.0 - 400.0 K/uL 268.0  280.0  279.0       CMP     Component Value Date/Time   NA 139 03/18/2022 1626   K 4.2 03/18/2022 1626   CL 104 03/18/2022 1626   CO2 27 03/18/2022 1626   GLUCOSE 115 (H) 03/18/2022 1626   BUN 20 03/18/2022 1626   CREATININE 1.22 03/18/2022 1626   CALCIUM 9.0 03/18/2022 1626   PROT 6.7 03/18/2022 1626   ALBUMIN 4.3 03/18/2022 1626   AST 21 03/18/2022 1626   ALT 36 03/18/2022 1626   ALKPHOS 107 03/18/2022 1626   BILITOT 1.0 03/18/2022 1626   GFRNONAA 51 (L) 10/29/2017 1350   GFRAA 59 (L) 10/29/2017 1350     No results found.     Assessment & Plan:   1. PAD (peripheral artery disease) ([place]) The patient continues to maintain that he does have some claudication-like symptoms but they are  not significant.  Based on this we will have the patient return  in 6 months for noninvasive studies.  Because symptoms are not lifestyle limiting at this point time we will continue to maintain close follow-up.  However patient is advised that if he begins to have worsening claudication or if he begins to have things such as rest pain or open wounds or ulcerations he should contact our office for sooner follow-up.  2. Mixed hyperlipidemia Continue statin as ordered and reviewed, no changes at this time   3. Type 2 diabetes mellitus without complication, with long-term current use of insulin (HCC) Continue hypoglycemic medications as already ordered, these medications have been reviewed and there are no changes at this time.  Hgb A1C to be monitored as already arranged by primary service   4. Primary hypertension Continue antihypertensive medications as already ordered, these medications have been reviewed and there are no changes at this time.    Current Outpatient Medications on File Prior to Visit  Medication Sig Dispense Refill   ACCU-CHEK GUIDE test strip 1 EACH BY OTHER ROUTE DAILY AT 12 NOON. USE AS INSTRUCTED 100 strip 5   Accu-Chek Softclix Lancets lancets USE AS DIRECTED 100 each 1   acetaminophen (TYLENOL) 500 MG tablet Take 500 mg by mouth as needed.     albuterol (VENTOLIN HFA) 108 (90 Base) MCG/ACT inhaler Inhale 2 puffs into the lungs every 6 (six) hours as needed for wheezing or shortness of breath. 8 g 0   amLODipine (NORVASC) 5 MG tablet TAKE 1/2 TABLET BY MOUTH DAILY (Patient taking differently: Take 2.5 mg by mouth daily.) 45 tablet 3   APPLE CIDER VINEGAR PO Take by mouth.     atorvastatin (LIPITOR) 80 MG tablet TAKE 1 TABLET BY MOUTH EVERY DAY FOR 6PM 90 tablet 1   blood glucose meter kit and supplies KIT Dispense based on patient and insurance preference. Use once daily. (FOR ICD-10 E11.9). 1 each 0   Cyanocobalamin (VITAMIN B-12 PO) Take by mouth.     metFORMIN  (GLUCOPHAGE) 500 MG tablet TAKE 1 TABLET BY MOUTH 2 TIMES DAILY WITH A MEAL. 180 tablet 3   Multiple Vitamins-Minerals (MULTIVITAMIN PO) Take 1 tablet by mouth daily.     Omega-3 Fatty Acids (FISH OIL PO) Take by mouth.     Protein POWD Take by mouth.     No current facility-administered medications on file prior to visit.    There are no Patient Instructions on file for this visit. No follow-ups on file.   Kris Hartmann, NP

## 2022-04-11 NOTE — Telephone Encounter (Signed)
Error. ng 

## 2022-04-14 ENCOUNTER — Other Ambulatory Visit: Payer: Self-pay | Admitting: Family Medicine

## 2022-04-14 DIAGNOSIS — J4520 Mild intermittent asthma, uncomplicated: Secondary | ICD-10-CM

## 2022-04-18 ENCOUNTER — Other Ambulatory Visit: Payer: Self-pay | Admitting: Family Medicine

## 2022-04-18 DIAGNOSIS — E782 Mixed hyperlipidemia: Secondary | ICD-10-CM

## 2022-05-19 ENCOUNTER — Other Ambulatory Visit: Payer: Self-pay | Admitting: Family Medicine

## 2022-06-23 ENCOUNTER — Ambulatory Visit (INDEPENDENT_AMBULATORY_CARE_PROVIDER_SITE_OTHER): Payer: Medicare Other | Admitting: Family Medicine

## 2022-06-23 ENCOUNTER — Encounter: Payer: Self-pay | Admitting: Family Medicine

## 2022-06-23 VITALS — BP 110/60 | HR 85 | Temp 98.3°F | Ht 67.0 in | Wt 169.8 lb

## 2022-06-23 DIAGNOSIS — Z1211 Encounter for screening for malignant neoplasm of colon: Secondary | ICD-10-CM

## 2022-06-23 DIAGNOSIS — I10 Essential (primary) hypertension: Secondary | ICD-10-CM

## 2022-06-23 DIAGNOSIS — J989 Respiratory disorder, unspecified: Secondary | ICD-10-CM | POA: Diagnosis not present

## 2022-06-23 DIAGNOSIS — Z23 Encounter for immunization: Secondary | ICD-10-CM | POA: Diagnosis not present

## 2022-06-23 DIAGNOSIS — E119 Type 2 diabetes mellitus without complications: Secondary | ICD-10-CM | POA: Diagnosis not present

## 2022-06-23 DIAGNOSIS — Z794 Long term (current) use of insulin: Secondary | ICD-10-CM | POA: Diagnosis not present

## 2022-06-23 LAB — MICROALBUMIN / CREATININE URINE RATIO
Creatinine,U: 95.8 mg/dL
Microalb Creat Ratio: 0.8 mg/g (ref 0.0–30.0)
Microalb, Ur: 0.7 mg/dL (ref 0.0–1.9)

## 2022-06-23 LAB — POCT GLYCOSYLATED HEMOGLOBIN (HGB A1C): Hemoglobin A1C: 6 % — AB (ref 4.0–5.6)

## 2022-06-23 MED ORDER — AREXVY 120 MCG/0.5ML IM SUSR: 0.5000 mL | Freq: Once | INTRAMUSCULAR | 0 refills | Status: AC

## 2022-06-23 NOTE — Assessment & Plan Note (Signed)
Adequately controlled on home CBGs.  Check A1c.  Continue metformin 500 mg twice daily.

## 2022-06-23 NOTE — Progress Notes (Signed)
Austin Sonnenberg, MD Phone: [phone]  Austin Richards is a 70 y.o. male who presents today for f/u.  DIABETES Disease Monitoring: Blood Sugar ranges-122-140 Polyuria/phagia/dipsia- no      Optho- UTD Medications: Compliance- taking metformin Hypoglycemic symptoms- no  HYPERTENSION Disease Monitoring Home BP Monitoring 120s/70s Chest pain- no    Dyspnea- no Medications Compliance-  taking amlodipine.  Edema- no BMET    Component Value Date/Time   NA 139 03/18/2022 1626   K 4.2 03/18/2022 1626   CL 104 03/18/2022 1626   CO2 27 03/18/2022 1626   GLUCOSE 115 (H) 03/18/2022 1626   BUN 20 03/18/2022 1626   CREATININE 1.22 03/18/2022 1626   CALCIUM 9.0 03/18/2022 1626   GFRNONAA 51 (L) 10/29/2017 1350   GFRAA 59 (L) 10/29/2017 1350   Respiratory illness: Patient notes he had a respiratory illness back in July.  He had cough and wheezing.  He took Mucinex and over-the-counter cough syrup.  He feels that he caught this from somebody at the vascular surgery office.  He is back to his baseline at this time.   Social History   Tobacco Use  Smoking Status Former   Types: Cigarettes  Smokeless Tobacco Never  Tobacco Comments   "smoked in my 20s for a short period of time"    Current Outpatient Medications on File Prior to Visit  Medication Sig Dispense Refill   ACCU-CHEK GUIDE test strip 1 EACH BY OTHER ROUTE DAILY AT 12 NOON. USE AS INSTRUCTED 100 strip 5   Accu-Chek Softclix Lancets lancets USE AS DIRECTED 100 each 1   acetaminophen (TYLENOL) 500 MG tablet Take 500 mg by mouth as needed.     albuterol (VENTOLIN HFA) 108 (90 Base) MCG/ACT inhaler TAKE 2 PUFFS BY MOUTH EVERY 6 HOURS AS NEEDED FOR WHEEZE OR SHORTNESS OF BREATH 18 each 1   amLODipine (NORVASC) 5 MG tablet TAKE 1/2 TABLET BY MOUTH EVERY DAY 45 tablet 3   APPLE CIDER VINEGAR PO Take by mouth.     atorvastatin (LIPITOR) 80 MG tablet TAKE 1 TABLET BY MOUTH EVERY DAY FOR 6PM 90 tablet 1   blood glucose  meter kit and supplies KIT Dispense based on patient and insurance preference. Use once daily. (FOR ICD-10 E11.9). 1 each 0   Cyanocobalamin (VITAMIN B-12 PO) Take by mouth.     metFORMIN (GLUCOPHAGE) 500 MG tablet TAKE 1 TABLET BY MOUTH 2 TIMES DAILY WITH A MEAL. 180 tablet 3   Multiple Vitamins-Minerals (MULTIVITAMIN PO) Take 1 tablet by mouth daily.     Omega-3 Fatty Acids (FISH OIL PO) Take by mouth.     Protein POWD Take by mouth.     No current facility-administered medications on file prior to visit.     ROS see history of present illness  Objective  Physical Exam Vitals:   06/23/22 1035  BP: 110/60  Pulse: 85  Temp: 98.3 F (36.8 C)  SpO2: 99%    BP Readings from Last 3 Encounters:  06/23/22 110/60  04/08/22 129/72  03/18/22 115/80   Wt Readings from Last 3 Encounters:  06/23/22 169 lb 12.8 oz (77 kg)  04/08/22 164 lb 6.4 oz (74.6 kg)  03/18/22 170 lb 9.6 oz (77.4 kg)    Physical Exam Constitutional:      General: He is not in acute distress.    Appearance: He is not diaphoretic.  Cardiovascular:     Rate and Rhythm: Normal rate and regular rhythm.     Heart sounds:   Normal heart sounds.  Pulmonary:     Effort: Pulmonary effort is normal.     Breath sounds: Normal breath sounds.  Skin:    General: Skin is warm and dry.  Neurological:     Mental Status: He is alert.      Assessment/Plan: Please see individual problem list.  Problem List Items Addressed This Visit     Diabetes ([place]) (Chronic)    Adequately controlled on home CBGs.  Check A1c.  Continue metformin 500 mg twice daily.      Relevant Orders   POCT HgB A1C   Urine Microalbumin w/creat. ratio   Hypertension - Primary (Chronic)    Well-controlled.  Continue amlodipine 2.5 mg daily.      Respiratory illness    Patient is back to his baseline.      Other Visit Diagnoses     Need for immunization against influenza       Relevant Orders   Flu Vaccine QUAD High Dose(Fluad)  (Completed)   Colon cancer screening       Relevant Orders   Ambulatory referral to Gastroenterology        Health Maintenance: Patient was encouraged to get the updated COVID vaccine at the pharmacy.  He is also encouraged to get the RSV vaccine. Refer to GI for colonoscopy. On review it appears he was due for 3 year recall on his last colonoscopy in 2016.   Return in about 6 months (around 12/22/2022).   Tommi Rumps, MD [place]

## 2022-06-23 NOTE — Assessment & Plan Note (Signed)
Well controlled. Continue amlodipine 2.5 mg daily.  

## 2022-06-23 NOTE — Patient Instructions (Signed)
Nice to see you. Please get the updated COVID-vaccine and the RSV vaccine at the pharmacy.

## 2022-06-23 NOTE — Assessment & Plan Note (Signed)
Patient is back to his baseline.

## 2022-06-25 ENCOUNTER — Other Ambulatory Visit: Payer: Self-pay

## 2022-06-25 ENCOUNTER — Telehealth: Payer: Self-pay

## 2022-06-25 DIAGNOSIS — Z86010 Personal history of colonic polyps: Secondary | ICD-10-CM

## 2022-06-25 MED ORDER — NA SULFATE-K SULFATE-MG SULF 17.5-3.13-1.6 GM/177ML PO SOLN: 354.0000 mL | Freq: Once | ORAL | 0 refills | Status: AC

## 2022-06-25 NOTE — Telephone Encounter (Signed)
Gastroenterology Pre-Procedure Review  Request Date: 07/30/2022 Requesting Physician: Dr. Marius Ditch   PATIENT REVIEW QUESTIONS: The patient responded to the following health history questions as indicated:    1. Are you having any GI issues? no 2. Do you have a personal history of Polyps? Yes last colonoscopy 01/16/15 3. Do you have a family history of Colon Cancer or Polyps? no but does not know for sure because he adopted  4. Diabetes Mellitus? Yes takes metformin  5. Joint replacements in the past 12 months?no 6. Major health problems in the past 3 months?no 7. Any artificial heart valves, MVP, or defibrillator?no    MEDICATIONS & ALLERGIES:    Patient reports the following regarding taking any anticoagulation/antiplatelet therapy:   Plavix, Coumadin, Eliquis, Xarelto, Lovenox, Pradaxa, Brilinta, or Effient? no Aspirin? no  Patient confirms/reports the following medications:  Current Outpatient Medications  Medication Sig Dispense Refill   ACCU-CHEK GUIDE test strip 1 EACH BY OTHER ROUTE DAILY AT 12 NOON. USE AS INSTRUCTED 100 strip 5   Accu-Chek Softclix Lancets lancets USE AS DIRECTED 100 each 1   acetaminophen (TYLENOL) 500 MG tablet Take 500 mg by mouth as needed.     albuterol (VENTOLIN HFA) 108 (90 Base) MCG/ACT inhaler TAKE 2 PUFFS BY MOUTH EVERY 6 HOURS AS NEEDED FOR WHEEZE OR SHORTNESS OF BREATH 18 each 1   amLODipine (NORVASC) 5 MG tablet TAKE 1/2 TABLET BY MOUTH EVERY DAY 45 tablet 3   APPLE CIDER VINEGAR PO Take by mouth.     atorvastatin (LIPITOR) 80 MG tablet TAKE 1 TABLET BY MOUTH EVERY DAY FOR 6PM 90 tablet 1   blood glucose meter kit and supplies KIT Dispense based on patient and insurance preference. Use once daily. (FOR ICD-10 E11.9). 1 each 0   Cyanocobalamin (VITAMIN B-12 PO) Take by mouth.     metFORMIN (GLUCOPHAGE) 500 MG tablet TAKE 1 TABLET BY MOUTH 2 TIMES DAILY WITH A MEAL. 180 tablet 3   Multiple Vitamins-Minerals (MULTIVITAMIN PO) Take 1 tablet by mouth  daily.     Omega-3 Fatty Acids (FISH OIL PO) Take by mouth.     Protein POWD Take by mouth.     No current facility-administered medications for this visit.    Patient confirms/reports the following allergies:  Allergies  Allergen Reactions   Codeine Nausea And Vomiting   Penicillins Hives, Nausea And Vomiting and Other (See Comments)    Has patient had a PCN reaction causing immediate rash, facial/tongue/throat swelling, SOB or lightheadedness with hypotension: Yes Has patient had a PCN reaction causing severe rash involving mucus membranes or skin necrosis: No Has patient had a PCN reaction that required hospitalization: Yes Has patient had a PCN reaction occurring within the last 10 years: No If all of the above answers are "NO", then may proceed with Cephalosporin use.    Latex Itching    Itching (gloves - when worn)    No orders of the defined types were placed in this encounter.   AUTHORIZATION INFORMATION Primary Insurance: 1D#: Group #:  Secondary Insurance: 1D#: Group #:  SCHEDULE INFORMATION: Date:  Time: Location:

## 2022-07-06 DIAGNOSIS — Z23 Encounter for immunization: Secondary | ICD-10-CM | POA: Diagnosis not present

## 2022-07-27 ENCOUNTER — Encounter (INDEPENDENT_AMBULATORY_CARE_PROVIDER_SITE_OTHER): Payer: Self-pay

## 2022-07-30 ENCOUNTER — Ambulatory Visit
Admission: RE | Admit: 2022-07-30 | Discharge: 2022-07-30 | Disposition: A | Discharge status: Home / Self Care | Payer: Medicare Other | Attending: Gastroenterology | Admitting: Gastroenterology

## 2022-07-30 ENCOUNTER — Encounter: Payer: Self-pay | Admitting: Gastroenterology

## 2022-07-30 ENCOUNTER — Ambulatory Visit: Payer: Medicare Other | Admitting: Registered Nurse

## 2022-07-30 ENCOUNTER — Encounter
Admission: RE | Disposition: A | Discharge status: Home / Self Care | Payer: Self-pay | Source: Home / Self Care | Attending: Gastroenterology

## 2022-07-30 DIAGNOSIS — Z860100 Personal history of colon polyps, unspecified: Secondary | ICD-10-CM

## 2022-07-30 DIAGNOSIS — N189 Chronic kidney disease, unspecified: Secondary | ICD-10-CM | POA: Diagnosis not present

## 2022-07-30 DIAGNOSIS — E1122 Type 2 diabetes mellitus with diabetic chronic kidney disease: Secondary | ICD-10-CM | POA: Insufficient documentation

## 2022-07-30 DIAGNOSIS — I129 Hypertensive chronic kidney disease with stage 1 through stage 4 chronic kidney disease, or unspecified chronic kidney disease: Secondary | ICD-10-CM | POA: Diagnosis not present

## 2022-07-30 DIAGNOSIS — K648 Other hemorrhoids: Secondary | ICD-10-CM | POA: Diagnosis not present

## 2022-07-30 DIAGNOSIS — D120 Benign neoplasm of cecum: Secondary | ICD-10-CM | POA: Diagnosis not present

## 2022-07-30 DIAGNOSIS — K635 Polyp of colon: Secondary | ICD-10-CM | POA: Diagnosis not present

## 2022-07-30 DIAGNOSIS — K219 Gastro-esophageal reflux disease without esophagitis: Secondary | ICD-10-CM | POA: Insufficient documentation

## 2022-07-30 DIAGNOSIS — Z87891 Personal history of nicotine dependence: Secondary | ICD-10-CM | POA: Diagnosis not present

## 2022-07-30 DIAGNOSIS — E785 Hyperlipidemia, unspecified: Secondary | ICD-10-CM | POA: Diagnosis not present

## 2022-07-30 DIAGNOSIS — K644 Residual hemorrhoidal skin tags: Secondary | ICD-10-CM | POA: Insufficient documentation

## 2022-07-30 DIAGNOSIS — D124 Benign neoplasm of descending colon: Secondary | ICD-10-CM | POA: Diagnosis not present

## 2022-07-30 DIAGNOSIS — K5730 Diverticulosis of large intestine without perforation or abscess without bleeding: Secondary | ICD-10-CM | POA: Insufficient documentation

## 2022-07-30 DIAGNOSIS — Z1211 Encounter for screening for malignant neoplasm of colon: Secondary | ICD-10-CM | POA: Diagnosis not present

## 2022-07-30 DIAGNOSIS — I10 Essential (primary) hypertension: Secondary | ICD-10-CM | POA: Diagnosis not present

## 2022-07-30 DIAGNOSIS — K5790 Diverticulosis of intestine, part unspecified, without perforation or abscess without bleeding: Secondary | ICD-10-CM | POA: Diagnosis not present

## 2022-07-30 HISTORY — PX: COLONOSCOPY WITH PROPOFOL: SHX5780

## 2022-07-30 LAB — GLUCOSE, CAPILLARY: Glucose-Capillary: 134 mg/dL — ABNORMAL HIGH (ref 70–99)

## 2022-07-30 SURGERY — COLONOSCOPY WITH PROPOFOL
Anesthesia: General

## 2022-07-30 MED ORDER — PHENYLEPHRINE HCL (PRESSORS) 10 MG/ML IV SOLN
INTRAVENOUS | Status: DC | PRN
  Administered 2022-07-30: 160 ug via INTRAVENOUS
  Administered 2022-07-30: 120 ug via INTRAVENOUS
  Administered 2022-07-30: 40 ug via INTRAVENOUS

## 2022-07-30 MED ORDER — EPHEDRINE SULFATE (PRESSORS) 50 MG/ML IJ SOLN
INTRAMUSCULAR | Status: DC | PRN
  Administered 2022-07-30 (×2): 5 mg via INTRAVENOUS

## 2022-07-30 MED ORDER — PROPOFOL 10 MG/ML IV BOLUS
INTRAVENOUS | Status: DC | PRN
  Administered 2022-07-30: 80 mg via INTRAVENOUS

## 2022-07-30 MED ORDER — PROPOFOL 500 MG/50ML IV EMUL
INTRAVENOUS | Status: DC | PRN
  Administered 2022-07-30: 175 ug/kg/min via INTRAVENOUS

## 2022-07-30 MED ORDER — LIDOCAINE HCL (CARDIAC) PF 100 MG/5ML IV SOSY
PREFILLED_SYRINGE | INTRAVENOUS | Status: DC | PRN
  Administered 2022-07-30: 100 mg via INTRAVENOUS

## 2022-07-30 MED ORDER — EPHEDRINE 5 MG/ML INJ
INTRAVENOUS | Status: AC
  Filled 2022-07-30: qty 5

## 2022-07-30 MED ORDER — SODIUM CHLORIDE 0.9 % IV SOLN: INTRAVENOUS | Status: DC

## 2022-07-30 MED ORDER — GLYCOPYRROLATE 0.2 MG/ML IJ SOLN
INTRAMUSCULAR | Status: AC
  Filled 2022-07-30: qty 1

## 2022-07-30 MED ORDER — PROPOFOL 1000 MG/100ML IV EMUL
INTRAVENOUS | Status: AC
  Filled 2022-07-30: qty 100

## 2022-07-30 NOTE — Anesthesia Postprocedure Evaluation (Signed)
Anesthesia Post Note  Patient: Austin Richards  Procedure(s) Performed: COLONOSCOPY WITH PROPOFOL  Patient location during evaluation: PACU Anesthesia Type: General Level of consciousness: awake and alert Pain management: pain level controlled Vital Signs Assessment: post-procedure vital signs reviewed and stable Respiratory status: spontaneous breathing, nonlabored ventilation and respiratory function stable Cardiovascular status: blood pressure returned to baseline and stable Postop Assessment: no apparent nausea or vomiting Anesthetic complications: no   No notable events documented.   Last Vitals:  Vitals:   07/30/22 1009 07/30/22 1019  BP: 107/65 114/75  Pulse: 93 99  Resp: 19 17  Temp:    SpO2: 98% 100%    Last Pain:  Vitals:   07/30/22 1019  TempSrc:   PainSc: 0-No pain                 [place] Ouch

## 2022-07-30 NOTE — Op Note (Signed)
[facility] Gastroenterology Patient Name: Austin Richards Procedure Date: 07/30/2022 9:32 AM MRN: [id number] Account #: [id number] Date of Birth: 1951-01-01 Admit Type: Outpatient Age: 71 Room: [facility] ENDO ROOM 4 Gender: Male Note Status: Finalized Instrument Name: Jasper Riling [id number] Procedure:             Colonoscopy Indications:           Surveillance: Personal history of colonic polyps                         (unknown histology) on last colonoscopy 5 years ago Providers:             Lin Landsman MD, MD Referring MD:          Angela Adam. Caryl Bis (Referring MD) Medicines:             General Anesthesia Complications:         No immediate complications. Estimated blood loss: None. Procedure:             Pre-Anesthesia Assessment:                        - Prior to the procedure, a History and Physical was                         performed, and patient medications and allergies were                         reviewed. The patient is competent. The risks and                         benefits of the procedure and the sedation options and                         risks were discussed with the patient. All questions                         were answered and informed consent was obtained.                         Patient identification and proposed procedure were                         verified by the physician, the nurse, the                         anesthesiologist, the anesthetist and the technician                         in the pre-procedure area in the procedure room in the                         endoscopy suite. Mental Status Examination: alert and                         oriented. Airway Examination: normal oropharyngeal                         airway and neck mobility. Respiratory Examination:  clear to auscultation. CV Examination: normal.                         Prophylactic Antibiotics: The patient does not require                          prophylactic antibiotics. Prior Anticoagulants: The                         patient has taken no anticoagulant or antiplatelet                         agents. ASA Grade Assessment: II - A patient with mild                         systemic disease. After reviewing the risks and                         benefits, the patient was deemed in satisfactory                         condition to undergo the procedure. The anesthesia                         plan was to use general anesthesia. Immediately prior                         to administration of medications, the patient was                         re-assessed for adequacy to receive sedatives. The                         heart rate, respiratory rate, oxygen saturations,                         blood pressure, adequacy of pulmonary ventilation, and                         response to care were monitored throughout the                         procedure. The physical status of the patient was                         re-assessed after the procedure.                        After obtaining informed consent, the colonoscope was                         passed under direct vision. Throughout the procedure,                         the patient's blood pressure, pulse, and oxygen                         saturations were monitored continuously. The  Colonoscope was introduced through the anus and                         advanced to the the cecum, identified by appendiceal                         orifice and ileocecal valve. The colonoscopy was                         performed without difficulty. The patient tolerated                         the procedure well. The quality of the bowel                         preparation was evaluated using the BBPS [facility] Bowel                         Preparation Scale) with scores of: Right Colon = 3,                         Transverse Colon = 3 and Left Colon = 3 (entire mucosa                          seen well with no residual staining, small fragments                         of stool or opaque liquid). The total BBPS score                         equals 9. The ileocecal valve, appendiceal orifice,                         and rectum were photographed. Findings:      The perianal and digital rectal examinations were normal. Pertinent       negatives include normal sphincter tone and no palpable rectal lesions.      Two sessile polyps were found in the cecum. The polyps were 2 to 3 mm in       size. These polyps were removed with a jumbo cold forceps. Resection and       retrieval were complete. Estimated blood loss: none.      A 5 mm polyp was found in the descending colon. The polyp was sessile.       The polyp was removed with a cold snare. Resection and retrieval were       complete.      Many diverticula were found in the left colon.      Non-bleeding external hemorrhoids were found during retroflexion. The       hemorrhoids were medium-sized. Impression:            - Two 2 to 3 mm polyps in the cecum, removed with a                         jumbo cold forceps. Resected and retrieved.                        - One 5  mm polyp in the descending colon, removed with                         a cold snare. Resected and retrieved.                        - Diverticulosis in the left colon.                        - Non-bleeding external hemorrhoids. Recommendation:        - Repeat colonoscopy in 5 years for surveillance based                         on pathology results.                        - Discharge patient to home (with escort).                        - Resume previous diet today.                        - Await pathology results.                        - Continue present medications. Procedure Code(s):     --- Professional ---                        [phone], Colonoscopy, flexible; with removal of                         tumor(s), polyp(s), or other lesion(s) by snare                          technique                        45380, 43, Colonoscopy, flexible; with biopsy, single                         or multiple Diagnosis Code(s):     --- Professional ---                        Z86.010, Personal history of colonic polyps                        D12.0, Benign neoplasm of cecum                        D12.4, Benign neoplasm of descending colon                        K64.4, Residual hemorrhoidal skin tags                        K57.30, Diverticulosis of large intestine without                         perforation or abscess without bleeding CPT copyright 2022 American Medical Association. All rights reserved. The codes documented in this report are preliminary and  upon coder review may  be revised to meet current compliance requirements. Dr. Ulyess Mort Lin Landsman MD, MD 07/30/2022 9:58:12 AM This report has been signed electronically. Number of Addenda: 0 Note Initiated On: 07/30/2022 9:32 AM Scope Withdrawal Time: 0 hours 13 minutes 37 seconds  Total Procedure Duration: 0 hours 16 minutes 37 seconds  Estimated Blood Loss:  Estimated blood loss: none.      [facility]

## 2022-07-30 NOTE — H&P (Signed)
Austin Darby, MD [address]  [place]  [place], [place] [address]  Main: [phone]  Fax: [phone] Pager: [phone]  Primary Care Physician:  Austin Haven, MD Primary Gastroenterologist:  Dr. Cephas Richards  Pre-Procedure History & Physical: HPI:  Austin Richards is a 71 y.o. male is here for an colonoscopy.   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   COVID-51 08/27/2020   Deaf    wears hearing aides   Diabetes mellitus without complication ([place])    Type 2   Hx of colonic polyp    Hyperlipidemia    Hypertension    Migraine    hx of   Neck pain    sees chiropracter / possible from whiplash in past   Pre-diabetes     Past Surgical History:  Procedure Laterality Date   CATARACT EXTRACTION W/PHACO Right 01/26/2022   Procedure: CATARACT EXTRACTION PHACO AND INTRAOCULAR LENS PLACEMENT ([place]) RIGHT DIABETIC 5.90 00:35.7;  Surgeon: Austin Bear, MD;  Location: [facility];  Service: Ophthalmology;  Laterality: Right;   CATARACT EXTRACTION W/PHACO Left 02/09/2022   Procedure: CATARACT EXTRACTION PHACO AND INTRAOCULAR LENS PLACEMENT ([facility]) LEFT 3.70 00:23.5;  Surgeon: Austin Bear, MD;  Location: [facility];  Service: Ophthalmology;  Laterality: Left;  Diabetic   CHOLECYSTECTOMY     TOTAL SHOULDER ARTHROPLASTY Right 11/10/2017   Procedure: TOTAL SHOULDER ARTHROPLASTY;  Surgeon: Austin Gash, MD;  Location: [facility];  Service: Orthopedics;  Laterality: Right;    Prior to Admission medications   Medication Sig Start Date End Date Taking? Authorizing Provider  albuterol (VENTOLIN HFA) 108 (90 Base) MCG/ACT inhaler TAKE 2 PUFFS BY MOUTH EVERY 6 HOURS AS NEEDED FOR WHEEZE OR SHORTNESS OF BREATH 04/14/22  Yes Austin Haven, MD  ACCU-CHEK GUIDE test strip 1 EACH BY OTHER ROUTE DAILY AT 12 NOON. USE AS INSTRUCTED  10/22/21   Austin Haven, MD  Accu-Chek Softclix Lancets lancets USE AS DIRECTED 12/05/20   Austin Haven, MD  acetaminophen (TYLENOL) 500 MG tablet Take 500 mg by mouth as needed.    [provider]  amLODipine (NORVASC) 5 MG tablet TAKE 1/2 TABLET BY MOUTH EVERY DAY 05/19/22   Austin Quint B, FNP  APPLE CIDER VINEGAR PO Take by mouth.    [provider]  atorvastatin (LIPITOR) 80 MG tablet TAKE 1 TABLET BY MOUTH EVERY DAY FOR 6PM 04/19/22   Austin Quint B, FNP  blood glucose meter kit and supplies KIT Dispense based on patient and insurance preference. Use once daily. (FOR ICD-10 E11.9). 05/17/20   Austin Haven, MD  Cyanocobalamin (VITAMIN Richards-12 PO) Take by mouth.    [provider]  metFORMIN (GLUCOPHAGE) 500 MG tablet TAKE 1 TABLET BY MOUTH 2 TIMES DAILY WITH A MEAL. 12/24/21   Austin Haven, MD  Multiple Vitamins-Minerals (MULTIVITAMIN PO) Take 1 tablet by mouth daily.    [provider]  Omega-3 Fatty Acids (FISH OIL PO) Take by mouth.    [provider]  Protein POWD Take by mouth.    [provider]    Allergies as of 06/25/2022 - Review Complete 06/23/2022  Allergen Reaction Noted   Codeine Nausea And Vomiting 12/29/2016   Penicillins Hives, Nausea And Vomiting, and Other (See Comments) 12/29/2016   Latex Itching 10/29/2017  Family History  Adopted: Yes    Social History   Socioeconomic History   Marital status: Single    Spouse name: Not on file   Number of children: 0   Years of education: some college   Highest education level: Not on file  Occupational History   Not on file  Tobacco Use   Smoking status: Former    Types: Cigarettes   Smokeless tobacco: Never   Tobacco comments:    "smoked in my 27s for a short period of time"  Vaping Use   Vaping Use: Never used  Substance and Sexual Activity   Alcohol use: Yes    Comment: rarely - social, "heavily in the past"   Drug use: No   Sexual  activity: Not on file  Other Topics Concern   Not on file  Social History Narrative   Right-handed.   Lives alone (sister is close by).   1 cup caffeine per day.   Social Determinants of Health   Financial Resource Strain: Not on file  Food Insecurity: Not on file  Transportation Needs: Not on file  Physical Activity: Not on file  Stress: Not on file  Social Connections: Not on file  Intimate Partner Violence: Not on file    Review of Systems: See HPI, otherwise negative ROS  Physical Exam: BP (!) 148/89   Pulse 92   Temp (!) 96.9 F (36.1 C) (Temporal)   Resp 18   Ht _0  (1.702 m)   Wt 74.4 kg   SpO2 100%   BMI 25.69 kg/m  General:   Alert,  pleasant and cooperative in NAD Head:  Normocephalic and atraumatic. Neck:  Supple; no masses or thyromegaly. Lungs:  Clear throughout to auscultation.    Heart:  Regular rate and rhythm. Abdomen:  Soft, nontender and nondistended. Normal bowel sounds, without guarding, and without rebound.   Neurologic:  Alert and  oriented x4;  grossly normal neurologically.  Impression/Plan: [name] is here for an colonoscopy to be performed for h/o colon polyps  Risks, benefits, limitations, and alternatives regarding  colonoscopy have been reviewed with the patient.  Questions have been answered.  All parties agreeable.   Sherri Sear, MD  07/30/2022, 9:21 AM

## 2022-07-30 NOTE — Anesthesia Preprocedure Evaluation (Addendum)
Anesthesia Evaluation  Patient identified by MRN, date of birth, ID band Patient awake    Reviewed: Allergy & Precautions, NPO status , Patient's Chart, lab work & pertinent test results, reviewed documented beta blocker date and time   History of Anesthesia Complications Negative for: history of anesthetic complications  Airway Mallampati: III  TM Distance: >3 FB Neck ROM: Limited    Dental no notable dental hx. (+)    Pulmonary former smoker   breath sounds clear to auscultation       Cardiovascular Exercise Tolerance: Good hypertension, (-) angina (-) DOE  Rhythm:Regular Rate:Normal   HLD   Neuro/Psych  Headaches  Deaf  Neuromuscular disease (Cervical radiculopathy)    GI/Hepatic ,GERD  Controlled,,  Endo/Other  diabetes, Type 2    Renal/GU CRFRenal disease     Musculoskeletal  (+) Arthritis ,    Abdominal Normal abdominal exam  (+)   Peds  Hematology   Anesthesia Other Findings   Reproductive/Obstetrics                             Anesthesia Physical Anesthesia Plan  ASA: 2  Anesthesia Plan: General   Post-op Pain Management: Minimal or no pain anticipated   Induction: Intravenous  PONV Risk Score and Plan: 1 and TIVA and Propofol infusion  Airway Management Planned: Nasal Cannula and Natural Airway  Additional Equipment:   Intra-op Plan:   Post-operative Plan:   Informed Consent: I have reviewed the patients History and Physical, chart, labs and discussed the procedure including the risks, benefits and alternatives for the proposed anesthesia with the patient or authorized representative who has indicated his/her understanding and acceptance.       Plan Discussed with: CRNA and Anesthesiologist  Anesthesia Plan Comments:         Anesthesia Quick Evaluation

## 2022-07-30 NOTE — Transfer of Care (Signed)
Immediate Anesthesia Transfer of Care Note  Patient: Austin Richards  Procedure(s) Performed: COLONOSCOPY WITH PROPOFOL  Patient Location: Endoscopy Unit  Anesthesia Type:General  Level of Consciousness: drowsy  Airway & Oxygen Therapy: Patient Spontanous Breathing  Post-op Assessment: Report given to RN and Post -op Vital signs reviewed and stable  Post vital signs: Reviewed and stable  Last Vitals:  Vitals Value Taken Time  BP 102/56 07/30/22 0959  Temp    Pulse 92 07/30/22 1000  Resp 12 07/30/22 1000  SpO2 99 % 07/30/22 1000  Vitals shown include unvalidated device data.  Last Pain:  Vitals:   07/30/22 0816  TempSrc: Temporal  PainSc: 0-No pain         Complications: No notable events documented.

## 2022-07-31 ENCOUNTER — Encounter: Payer: Self-pay | Admitting: Gastroenterology

## 2022-07-31 LAB — SURGICAL PATHOLOGY

## 2022-08-02 ENCOUNTER — Encounter: Payer: Self-pay | Admitting: Gastroenterology

## 2022-08-28 ENCOUNTER — Telehealth: Payer: Self-pay | Admitting: Family Medicine

## 2022-08-28 NOTE — Telephone Encounter (Signed)
Copied from [place] [phone]. Topic: Medicare AWV >> Aug 28, 2022  1:18 PM Devoria Glassing wrote: Reason for CRM: Left message for patient to schedule Annual Wellness Visit.  Please schedule with Nurse Health Advisor Denisa O'Brien-Blaney, LPN at [facility]. This appt can be telephone or office visit.  Please call [phone] ask for [facility]

## 2022-10-08 ENCOUNTER — Ambulatory Visit (INDEPENDENT_AMBULATORY_CARE_PROVIDER_SITE_OTHER): Payer: Medicare Other | Admitting: Nurse Practitioner

## 2022-10-08 ENCOUNTER — Encounter (INDEPENDENT_AMBULATORY_CARE_PROVIDER_SITE_OTHER): Payer: Medicare Other

## 2022-10-28 ENCOUNTER — Other Ambulatory Visit: Payer: Self-pay | Admitting: Family

## 2022-10-28 DIAGNOSIS — E782 Mixed hyperlipidemia: Secondary | ICD-10-CM

## 2022-10-31 ENCOUNTER — Encounter: Payer: Self-pay | Admitting: Family Medicine

## 2022-11-02 ENCOUNTER — Other Ambulatory Visit: Payer: Self-pay

## 2022-11-02 ENCOUNTER — Telehealth: Payer: Self-pay | Admitting: Family Medicine

## 2022-11-02 DIAGNOSIS — E782 Mixed hyperlipidemia: Secondary | ICD-10-CM

## 2022-11-02 MED ORDER — ATORVASTATIN CALCIUM 80 MG PO TABS: ORAL_TABLET | ORAL | 1 refills | Status: DC

## 2022-11-02 NOTE — Telephone Encounter (Signed)
Prescription Request  11/02/2022  Is this a "Controlled Substance" medicine? No  LOV: 06/23/2022  What is the name of the medication or equipment? atorvastatin (LIPITOR) 80 MG tablet and any other medications needed refilled.  Have you contacted your pharmacy to request a refill? Yes   Which pharmacy would you like this sent to?  [id number] [place] [address] [id number]: [phone]: [phone]   Patient notified that their request is being sent to the clinical staff for review and that they should receive a response within 2 business days.   Please advise at Mobile [phone](mobile)

## 2022-11-02 NOTE — Telephone Encounter (Signed)
Medication sent to pharmacy.  Abdo Denault,cma  ?

## 2022-11-09 ENCOUNTER — Telehealth: Payer: Self-pay | Admitting: Family Medicine

## 2022-11-09 MED ORDER — AMLODIPINE BESYLATE 5 MG PO TABS: 2.5000 mg | ORAL_TABLET | Freq: Every day | ORAL | 3 refills | Status: DC

## 2022-11-09 NOTE — Addendum Note (Signed)
Addended by: Cordelia Pen on: 11/09/2022 05:52 PM   Modules accepted: Orders

## 2022-11-09 NOTE — Telephone Encounter (Signed)
Prescription Request  11/09/2022  Is this a "Controlled Substance" medicine? No  LOV: 06/23/2022  What is the name of the medication or equipment? amLODipine (NORVASC) 5 MG tablet  Have you contacted your pharmacy to request a refill? Yes   Which pharmacy would you like this sent to?  [id number] [place] [address] [id number]: [phone]: [phone]   Patient notified that their request is being sent to the clinical staff for review and that they should receive a response within 2 business days.   Please advise at Mobile [phone](mobile)

## 2022-12-13 ENCOUNTER — Other Ambulatory Visit: Payer: Self-pay | Admitting: Family Medicine

## 2022-12-13 DIAGNOSIS — R7303 Prediabetes: Secondary | ICD-10-CM

## 2022-12-22 ENCOUNTER — Ambulatory Visit (INDEPENDENT_AMBULATORY_CARE_PROVIDER_SITE_OTHER): Payer: Medicare Other | Admitting: Family Medicine

## 2022-12-22 ENCOUNTER — Encounter: Payer: Self-pay | Admitting: Family Medicine

## 2022-12-22 VITALS — BP 124/72 | HR 94 | Temp 97.6°F | Ht 67.0 in | Wt 168.2 lb

## 2022-12-22 DIAGNOSIS — G319 Degenerative disease of nervous system, unspecified: Secondary | ICD-10-CM | POA: Diagnosis not present

## 2022-12-22 DIAGNOSIS — R5383 Other fatigue: Secondary | ICD-10-CM

## 2022-12-22 DIAGNOSIS — I10 Essential (primary) hypertension: Secondary | ICD-10-CM | POA: Diagnosis not present

## 2022-12-22 DIAGNOSIS — E119 Type 2 diabetes mellitus without complications: Secondary | ICD-10-CM

## 2022-12-22 DIAGNOSIS — Z794 Long term (current) use of insulin: Secondary | ICD-10-CM

## 2022-12-22 LAB — COMPREHENSIVE METABOLIC PANEL
ALT: 29 U/L (ref 0–53)
AST: 21 U/L (ref 0–37)
Albumin: 4.5 g/dL (ref 3.5–5.2)
Alkaline Phosphatase: 109 U/L (ref 39–117)
BUN: 19 mg/dL (ref 6–23)
CO2: 27 mEq/L (ref 19–32)
Calcium: 9.3 mg/dL (ref 8.4–10.5)
Chloride: 104 mEq/L (ref 96–112)
Creatinine, Ser: 1.19 mg/dL (ref 0.40–1.50)
GFR: 61.95 mL/min (ref >60.00)
Glucose, Bld: 79 mg/dL (ref 70–99)
Potassium: 4.7 mEq/L (ref 3.5–5.1)
Sodium: 140 mEq/L (ref 135–145)
Total Bilirubin: 1.3 mg/dL — ABNORMAL HIGH (ref 0.2–1.2)
Total Protein: 6.9 g/dL (ref 6.0–8.3)

## 2022-12-22 LAB — HEMOGLOBIN A1C: Hgb A1c MFr Bld: 6.1 % (ref 4.6–6.5)

## 2022-12-22 LAB — VITAMIN B12: Vitamin B-12: 620 pg/mL (ref 211–911)

## 2022-12-22 LAB — TSH: TSH: 1.38 u[IU]/mL (ref 0.35–5.50)

## 2022-12-22 NOTE — Progress Notes (Signed)
Tommi Rumps, MD Phone: [phone]  Austin Richards is a 72 y.o. male who presents today for f/u.  HYPERTENSION Disease Monitoring Home BP Monitoring 126/72 Chest pain- no    Dyspnea- no Medications Compliance-  taking amlodipine.  BMET    Component Value Date/Time   NA 139 03/18/2022 1626   K 4.2 03/18/2022 1626   CL 104 03/18/2022 1626   CO2 27 03/18/2022 1626   GLUCOSE 115 (H) 03/18/2022 1626   BUN 20 03/18/2022 1626   CREATININE 1.22 03/18/2022 1626   CALCIUM 9.0 03/18/2022 1626   GFRNONAA 51 (L) 10/29/2017 1350   GFRAA 59 (L) 10/29/2017 1350   DIABETES Disease Monitoring: Blood Sugar ranges-118-191 Polyuria/phagia/dipsia- no      Optho- due Medications: Compliance- taking metformin Hypoglycemic symptoms- no  Patient has been going to the gym 3 times a week.  He staying active at home.  He is following a blue zone diet.  He does note some tiredness though he has been doing a lot of activity.  Cerebellar atrophy: Patient notes his balance has gotten a little worse.  He has seen neurology for this and there is not much to do for it at this time.  His sister told him that he has been repeating words periodically though he notes no memory issues.  He does not want to do physical therapy to help with his balance.   Social History   Tobacco Use  Smoking Status Former   Types: Cigarettes  Smokeless Tobacco Never  Tobacco Comments   "smoked in my 60s for a short period of time"    Current Outpatient Medications on File Prior to Visit  Medication Sig Dispense Refill   ACCU-CHEK GUIDE test strip 1 EACH BY OTHER ROUTE DAILY AT 12 NOON. USE AS INSTRUCTED 100 strip 5   Accu-Chek Softclix Lancets lancets USE AS DIRECTED 100 each 1   acetaminophen (TYLENOL) 500 MG tablet Take 500 mg by mouth as needed.     albuterol (VENTOLIN HFA) 108 (90 Base) MCG/ACT inhaler TAKE 2 PUFFS BY MOUTH EVERY 6 HOURS AS NEEDED FOR WHEEZE OR SHORTNESS OF BREATH 18 each 1   amLODipine  (NORVASC) 5 MG tablet Take 0.5 tablets (2.5 mg total) by mouth daily. 45 tablet 3   APPLE CIDER VINEGAR PO Take by mouth.     atorvastatin (LIPITOR) 80 MG tablet TAKE 1 TABLET BY MOUTH EVERY DAY FOR 6PM 90 tablet 1   blood glucose meter kit and supplies KIT Dispense based on patient and insurance preference. Use once daily. (FOR ICD-10 E11.9). 1 each 0   Cyanocobalamin (VITAMIN B-12 PO) Take by mouth.     metFORMIN (GLUCOPHAGE) 500 MG tablet TAKE 1 TABLET BY MOUTH TWICE A DAY WITH MEALS 180 tablet 3   Multiple Vitamins-Minerals (MULTIVITAMIN PO) Take 1 tablet by mouth daily.     Omega-3 Fatty Acids (FISH OIL PO) Take by mouth.     Protein POWD Take by mouth.     No current facility-administered medications on file prior to visit.     ROS see history of present illness  Objective  Physical Exam Vitals:   12/22/22 0927  BP: 124/72  Pulse: 94  Temp: 97.6 F (36.4 C)  SpO2: 99%    BP Readings from Last 3 Encounters:  12/22/22 124/72  07/30/22 114/75  06/23/22 110/60   Wt Readings from Last 3 Encounters:  12/22/22 168 lb 3.2 oz (76.3 kg)  07/30/22 164 lb (74.4 kg)  06/23/22 169 lb  12.8 oz (77 kg)    Physical Exam Constitutional:      General: He is not in acute distress.    Appearance: He is not diaphoretic.  Cardiovascular:     Rate and Rhythm: Normal rate and regular rhythm.     Heart sounds: Normal heart sounds.  Pulmonary:     Effort: Pulmonary effort is normal.     Breath sounds: Normal breath sounds.  Skin:    General: Skin is warm and dry.  Neurological:     Mental Status: He is alert.      Assessment/Plan: Please see individual problem list.  Primary hypertension Assessment & Plan: Chronic issue.  Adequately controlled.  Continue amlodipine 2.5 mg daily.  Orders: -     Comprehensive metabolic panel  Type 2 diabetes mellitus without complication, with long-term current use of insulin ([place]) Assessment & Plan: Chronic issue.  Check A1c.  Continue  metformin 500 mg twice daily.  Continue diet and exercise.  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  Balance has worsened a little bit.  I offered referral for physical therapy to help with this though he defers at this time.  He will remain active.  He will monitor his memory.   Other fatigue Assessment & Plan: Possibly related to all the activities he is doing though we will get lab work today to rule out other underlying causes.  Orders: -     TSH -     Vitamin B12     Health Maintenance: Encouraged to follow-up with ophthalmology once yearly.  Return in about 6 months (around 06/24/2023).   Tommi Rumps, MD [place]

## 2022-12-22 NOTE — Assessment & Plan Note (Signed)
Possibly related to all the activities he is doing though we will get lab work today to rule out other underlying causes.

## 2022-12-22 NOTE — Patient Instructions (Addendum)
Nice to see you. Will get some lab work today and contact you with the results. Please make sure you see your eye doctor once yearly.

## 2022-12-22 NOTE — Assessment & Plan Note (Signed)
Chronic issue.  Balance has worsened a little bit.  I offered referral for physical therapy to help with this though he defers at this time.  He will remain active.  He will monitor his memory.

## 2022-12-22 NOTE — Assessment & Plan Note (Addendum)
Chronic issue.  Check A1c.  Continue metformin 500 mg twice daily.  Continue diet and exercise.

## 2022-12-22 NOTE — Assessment & Plan Note (Signed)
Chronic issue.  Adequately controlled.  Continue amlodipine 2.5 mg daily.

## 2022-12-31 ENCOUNTER — Encounter: Payer: Self-pay | Admitting: *Deleted

## 2023-01-08 ENCOUNTER — Telehealth: Payer: Self-pay | Admitting: Family Medicine

## 2023-01-08 ENCOUNTER — Other Ambulatory Visit: Payer: Self-pay

## 2023-01-08 DIAGNOSIS — Z794 Long term (current) use of insulin: Secondary | ICD-10-CM

## 2023-01-08 MED ORDER — ACCU-CHEK GUIDE VI STRP: 1.0000 | ORAL_STRIP | Freq: Every day | 5 refills | Status: AC

## 2023-01-08 NOTE — Telephone Encounter (Signed)
Pt called stating he need a refill on test strips sent to [facility]

## 2023-01-08 NOTE — Telephone Encounter (Signed)
Prescription sent in  

## 2023-02-19 ENCOUNTER — Other Ambulatory Visit: Payer: Self-pay | Admitting: Family Medicine

## 2023-02-19 DIAGNOSIS — E782 Mixed hyperlipidemia: Secondary | ICD-10-CM

## 2023-04-29 DIAGNOSIS — E119 Type 2 diabetes mellitus without complications: Secondary | ICD-10-CM | POA: Diagnosis not present

## 2023-04-29 DIAGNOSIS — Z961 Presence of intraocular lens: Secondary | ICD-10-CM | POA: Diagnosis not present

## 2023-04-29 DIAGNOSIS — H26491 Other secondary cataract, right eye: Secondary | ICD-10-CM | POA: Diagnosis not present

## 2023-04-29 LAB — HM DIABETES EYE EXAM

## 2023-05-13 ENCOUNTER — Encounter (INDEPENDENT_AMBULATORY_CARE_PROVIDER_SITE_OTHER): Payer: Self-pay

## 2023-06-15 ENCOUNTER — Encounter: Payer: Self-pay | Admitting: Family Medicine

## 2023-06-15 ENCOUNTER — Ambulatory Visit (INDEPENDENT_AMBULATORY_CARE_PROVIDER_SITE_OTHER): Payer: Medicare Other | Admitting: Family Medicine

## 2023-06-15 VITALS — BP 130/84 | HR 92 | Temp 98.8°F | Ht 67.0 in | Wt 170.8 lb

## 2023-06-15 DIAGNOSIS — Z23 Encounter for immunization: Secondary | ICD-10-CM

## 2023-06-15 DIAGNOSIS — Z125 Encounter for screening for malignant neoplasm of prostate: Secondary | ICD-10-CM | POA: Diagnosis not present

## 2023-06-15 DIAGNOSIS — R5383 Other fatigue: Secondary | ICD-10-CM | POA: Diagnosis not present

## 2023-06-15 DIAGNOSIS — E119 Type 2 diabetes mellitus without complications: Secondary | ICD-10-CM

## 2023-06-15 DIAGNOSIS — Z7984 Long term (current) use of oral hypoglycemic drugs: Secondary | ICD-10-CM | POA: Diagnosis not present

## 2023-06-15 DIAGNOSIS — I10 Essential (primary) hypertension: Secondary | ICD-10-CM

## 2023-06-15 DIAGNOSIS — E782 Mixed hyperlipidemia: Secondary | ICD-10-CM

## 2023-06-15 DIAGNOSIS — Z794 Long term (current) use of insulin: Secondary | ICD-10-CM

## 2023-06-15 LAB — LIPID PANEL
Cholesterol: 134 mg/dL (ref 0–200)
HDL: 37.7 mg/dL — ABNORMAL LOW (ref >39.00)
LDL Cholesterol: 24 mg/dL (ref 0–99)
NonHDL: 96.17
Total CHOL/HDL Ratio: 4
Triglycerides: 359 mg/dL — ABNORMAL HIGH (ref 0.0–149.0)
VLDL: 71.8 mg/dL — ABNORMAL HIGH (ref 0.0–40.0)

## 2023-06-15 LAB — COMPREHENSIVE METABOLIC PANEL
ALT: 34 U/L (ref 0–53)
AST: 26 U/L (ref 0–37)
Albumin: 4.4 g/dL (ref 3.5–5.2)
Alkaline Phosphatase: 111 U/L (ref 39–117)
BUN: 21 mg/dL (ref 6–23)
CO2: 27 meq/L (ref 19–32)
Calcium: 9.7 mg/dL (ref 8.4–10.5)
Chloride: 102 meq/L (ref 96–112)
Creatinine, Ser: 1.3 mg/dL (ref 0.40–1.50)
GFR: 55.52 mL/min — ABNORMAL LOW (ref >60.00)
Glucose, Bld: 109 mg/dL — ABNORMAL HIGH (ref 70–99)
Potassium: 4.5 meq/L (ref 3.5–5.1)
Sodium: 138 meq/L (ref 135–145)
Total Bilirubin: 1.8 mg/dL — ABNORMAL HIGH (ref 0.2–1.2)
Total Protein: 6.9 g/dL (ref 6.0–8.3)

## 2023-06-15 LAB — HEMOGLOBIN A1C: Hgb A1c MFr Bld: 6.3 % (ref 4.6–6.5)

## 2023-06-15 LAB — PSA, MEDICARE: PSA: 0.39 ng/mL (ref 0.10–4.00)

## 2023-06-15 NOTE — Progress Notes (Signed)
Marikay Alar, MD Phone: [phone]  Austin Richards is a 72 y.o. male who presents today for f/u  HYPERTENSION Disease Monitoring: Blood pressure range-110-130/70s Chest pain- no      Dyspnea- no Medications: Compliance- taking amlodipine  DIABETES Disease Monitoring: Blood Sugar ranges-130s Polyuria/phagia/dipsia- notes nocturia, though notes he drinks a lot of fluid     Optho- UTD Medications: Compliance- taking metformin Hypoglycemic symptoms- no  HYPERLIPIDEMIA Disease Monitoring: See symptoms for Hypertension Medications: Compliance- taking lipitor Right upper quadrant pain- no  Muscle aches- no  Patient reports his sister told him he looks tired but he does not feel tired or fatigued.  Patient goes to the gym 3 times a week and does lots of yard work for his sister.  He eats a Mediterranean diet with plenty of protein.    Social History   Tobacco Use  Smoking Status Former   Types: Cigarettes  Smokeless Tobacco Never  Tobacco Comments   "smoked in my 65s for a short period of time"    Current Outpatient Medications on File Prior to Visit  Medication Sig Dispense Refill   Accu-Chek Softclix Lancets lancets USE AS DIRECTED 100 each 1   acetaminophen (TYLENOL) 500 MG tablet Take 500 mg by mouth as needed.     albuterol (VENTOLIN HFA) 108 (90 Base) MCG/ACT inhaler TAKE 2 PUFFS BY MOUTH EVERY 6 HOURS AS NEEDED FOR WHEEZE OR SHORTNESS OF BREATH 18 each 1   amLODipine (NORVASC) 5 MG tablet Take 0.5 tablets (2.5 mg total) by mouth daily. 45 tablet 3   APPLE CIDER VINEGAR PO Take by mouth.     atorvastatin (LIPITOR) 80 MG tablet TAKE 1 TABLET BY MOUTH EVERY DAY FOR 6PM 90 tablet 1   blood glucose meter kit and supplies KIT Dispense based on patient and insurance preference. Use once daily. (FOR ICD-10 E11.9). 1 each 0   Cyanocobalamin (VITAMIN B-12 PO) Take by mouth.     glucose blood (ACCU-CHEK GUIDE) test strip 1 each by Other route daily at 12 noon. Use  as instructed 100 strip 5   metFORMIN (GLUCOPHAGE) 500 MG tablet TAKE 1 TABLET BY MOUTH TWICE A DAY WITH MEALS 180 tablet 3   Multiple Vitamins-Minerals (MULTIVITAMIN PO) Take 1 tablet by mouth daily.     Omega-3 Fatty Acids (FISH OIL PO) Take by mouth.     Protein POWD Take by mouth.     No current facility-administered medications on file prior to visit.     ROS see history of present illness  Objective  Physical Exam Vitals:   06/15/23 0936  BP: 130/84  Pulse: 92  Temp: 98.8 F (37.1 C)  SpO2: 99%    BP Readings from Last 3 Encounters:  06/15/23 130/84  12/22/22 124/72  07/30/22 114/75   Wt Readings from Last 3 Encounters:  06/15/23 170 lb 12.8 oz (77.5 kg)  12/22/22 168 lb 3.2 oz (76.3 kg)  07/30/22 164 lb (74.4 kg)    Physical Exam Constitutional:      General: He is not in acute distress.    Appearance: He is not diaphoretic.  Cardiovascular:     Rate and Rhythm: Normal rate and regular rhythm.     Heart sounds: Normal heart sounds.  Pulmonary:     Effort: Pulmonary effort is normal.     Breath sounds: Normal breath sounds.  Skin:    General: Skin is warm and dry.  Neurological:     Mental Status: He is alert.  Assessment/Plan: Please see individual problem list.  Primary hypertension Assessment & Plan: Chronic issue.  Generally well-controlled at home.  Patient will continue amlodipine 2.5 mg daily.  Orders: -     Comprehensive metabolic panel  Type 2 diabetes mellitus without complication, with long-term current use of insulin (HCC) Assessment & Plan: Chronic issue.  Continue metformin 500 mg twice daily.  Check labs.  Discussed the nocturia could be related to how much water he is drinking or related to uncontrolled diabetes.  He will continue to work on diet and exercise as well.  Orders: -     Comprehensive metabolic panel -     Hemoglobin A1c  Other fatigue Assessment & Plan: Patient does not feel tired or fatigued at this time.   Prior labs have been unrevealing for a cause of fatigue previously.  He will monitor.   Prostate cancer screening -     PSA, Medicare  Mixed hyperlipidemia Assessment & Plan: Chronic issue.  Check lipid panel.  Continue Lipitor 80 mg daily.  Orders: -     Lipid panel  Encounter for administration of vaccine -     Flu Vaccine Trivalent High Dose (Fluad)     Health Maintenance: Flu vaccine given today.  Patient wondered if he needed another RSV vaccine and I advised given the current guidelines he does not need another RSV vaccine.  Return in about 6 months (around 12/13/2023).   Marikay Alar, MD [facility]

## 2023-06-15 NOTE — Assessment & Plan Note (Signed)
Chronic issue.  Check lipid panel.  Continue Lipitor 80 mg daily.

## 2023-06-15 NOTE — Assessment & Plan Note (Signed)
Chronic issue.  Generally well-controlled at home.  Patient will continue amlodipine 2.5 mg daily.

## 2023-06-15 NOTE — Assessment & Plan Note (Signed)
Chronic issue.  Continue metformin 500 mg twice daily.  Check labs.  Discussed the nocturia could be related to how much water he is drinking or related to uncontrolled diabetes.  He will continue to work on diet and exercise as well.

## 2023-06-15 NOTE — Assessment & Plan Note (Signed)
Patient does not feel tired or fatigued at this time.  Prior labs have been unrevealing for a cause of fatigue previously.  He will monitor.

## 2023-06-24 ENCOUNTER — Encounter: Payer: Self-pay | Admitting: *Deleted

## 2023-11-09 ENCOUNTER — Other Ambulatory Visit: Payer: Self-pay | Admitting: Family Medicine

## 2023-11-09 DIAGNOSIS — E782 Mixed hyperlipidemia: Secondary | ICD-10-CM

## 2023-11-10 ENCOUNTER — Other Ambulatory Visit: Payer: Self-pay | Admitting: Family Medicine

## 2023-12-13 ENCOUNTER — Ambulatory Visit: Payer: Medicare Other | Admitting: Family Medicine

## 2023-12-27 ENCOUNTER — Other Ambulatory Visit: Payer: Self-pay | Admitting: Family Medicine

## 2023-12-27 DIAGNOSIS — R7303 Prediabetes: Secondary | ICD-10-CM

## 2023-12-27 MED ORDER — METFORMIN HCL 500 MG PO TABS: 500.0000 mg | ORAL_TABLET | Freq: Two times a day (BID) | ORAL | 0 refills | Status: DC

## 2023-12-27 NOTE — Telephone Encounter (Signed)
 Copied from CRM [id number]. Topic: Clinical - Medication Refill >> Dec 27, 2023 11:33 AM [place] J wrote: Most Recent Primary Care Visit:  Provider: Birdie Sons, ERIC G  Department: [facility]  Visit Type: OFFICE VISIT  Date: 06/15/2023  Medication: metFORMIN (GLUCOPHAGE) 500 MG  Has the patient contacted their pharmacy? Yes (Agent: If no, request that the patient contact the pharmacy for the refill. If patient does not wish to contact the pharmacy document the reason why and proceed with request.) (Agent: If yes, when and what did the pharmacy advise?) Pharmacy had trouble processing order.  Is this the correct pharmacy for this prescription? Yes If no, delete pharmacy and type the correct one.  This is the patient's preferred pharmacy:  [id number] [place], [place] - [address] [place] [address] Phone: [phone] Fax: [phone]   Has the prescription been filled recently? No  Is the patient out of the medication? Yes  Has the patient been seen for an appointment in the last year OR does the patient have an upcoming appointment? Yes  Can we respond through MyChart? No  Agent: Please be advised that Rx refills may take up to 3 business days. We ask that you follow-up with your pharmacy.

## 2024-01-25 ENCOUNTER — Ambulatory Visit (INDEPENDENT_AMBULATORY_CARE_PROVIDER_SITE_OTHER): Payer: Medicare Other | Admitting: Nurse Practitioner

## 2024-01-25 VITALS — BP 126/62 | HR 88 | Temp 97.8°F | Ht 67.0 in | Wt 170.6 lb

## 2024-01-25 DIAGNOSIS — I10 Essential (primary) hypertension: Secondary | ICD-10-CM

## 2024-01-25 DIAGNOSIS — R270 Ataxia, unspecified: Secondary | ICD-10-CM | POA: Diagnosis not present

## 2024-01-25 DIAGNOSIS — Z7984 Long term (current) use of oral hypoglycemic drugs: Secondary | ICD-10-CM

## 2024-01-25 DIAGNOSIS — G319 Degenerative disease of nervous system, unspecified: Secondary | ICD-10-CM | POA: Diagnosis not present

## 2024-01-25 DIAGNOSIS — E782 Mixed hyperlipidemia: Secondary | ICD-10-CM | POA: Diagnosis not present

## 2024-01-25 DIAGNOSIS — E119 Type 2 diabetes mellitus without complications: Secondary | ICD-10-CM | POA: Diagnosis not present

## 2024-01-25 DIAGNOSIS — Z794 Long term (current) use of insulin: Secondary | ICD-10-CM

## 2024-01-25 MED ORDER — ATORVASTATIN CALCIUM 80 MG PO TABS: ORAL_TABLET | ORAL | 3 refills | Status: AC

## 2024-01-25 MED ORDER — METFORMIN HCL 500 MG PO TABS: 500.0000 mg | ORAL_TABLET | Freq: Two times a day (BID) | ORAL | 3 refills | Status: AC

## 2024-01-25 NOTE — Progress Notes (Signed)
 Bluford Burkitt, NP-C Phone: [phone]  Austin Richards is a 73 y.o. male who presents today for transfer of care. He is present with his sister who helps with interpretation, as the patient reads lips.   Discussed the use of AI scribe software for clinical note transcription with the patient, who gave verbal consent to proceed.  History of Present Illness   Austin Richards is a 73 year old male who presents for transfer of care.  He has been experiencing progressive balance and coordination issues over the years, which he attributes to alcohol use. He previously found physical therapy beneficial and now maintains his balance through regular exercise at the gym three times a week, including treadmill walking and weight exercises. He feels this regimen is more beneficial than returning to physical therapy.  Approximately five years ago, he underwent a neurological evaluation, including an MRI that showed a normal brain. 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 He uses a rollator walker for stability during certain activities and pushes a cart in grocery stores to aid his balance. He occasionally falls but is able to get back up without injury.  He is on medication for hypertension, taking half a tablet daily, and monitors his blood pressure at home, which is typically around 130/70 mmHg. He also takes metformin  for diabetes, with morning blood sugar levels ranging from 125 to 160 mg/dL, and atorvastatin  for cholesterol management. No excessive thirst or urination, and his blood sugar is usually around 130 mg/dL.  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 He has a history of a mild hernia and reverse shoulder surgery following a fall, as well as gallbladder surgery in 1995.  No  chest pain, shortness of breath, leg swelling, or abdominal pain. He reports a skin condition resembling psoriasis that does not break open and is managed with lotion. He had a past episode of COVID-19, during which he required antibody therapy and experienced mild pneumonia, which has since resolved.      Social History   Tobacco Use  Smoking Status Former   Types: Cigarettes  Smokeless Tobacco Never  Tobacco Comments   "smoked in my 74s for a short period of time"    Current Outpatient Medications on File Prior to Visit  Medication Sig Dispense Refill   Accu-Chek Softclix Lancets lancets USE AS DIRECTED 100 each 1   acetaminophen  (TYLENOL ) 500 MG tablet Take 500 mg by mouth as needed.     albuterol  (VENTOLIN  HFA) 108 (90 Base) MCG/ACT inhaler TAKE 2 PUFFS BY MOUTH EVERY 6 HOURS AS NEEDED FOR WHEEZE OR SHORTNESS OF BREATH 18 each 1   amLODipine  (NORVASC ) 5 MG tablet TAKE 1/2 TABLET BY MOUTH DAILY 45 tablet 3   APPLE CIDER VINEGAR PO Take by mouth.     blood glucose meter kit and supplies KIT Dispense based on patient and insurance preference. Use once daily. (FOR ICD-10 E11.9). 1 each 0   glucose blood (ACCU-CHEK GUIDE) test strip 1 each by Other route daily at 12 noon. Use as instructed 100 strip 5   Multiple Vitamins-Minerals (MULTIVITAMIN PO) Take 1 tablet by mouth daily.     Omega-3 Fatty Acids (FISH OIL PO) Take by mouth.     Protein POWD Take by mouth.     No current facility-administered medications on  file prior to visit.     ROS see history of present illness  Objective  Physical Exam Vitals:   01/25/24 1523  BP: 126/62  Pulse: 88  Temp: 97.8 F (36.6 C)  SpO2: 98%    BP Readings from Last 3 Encounters:  01/25/24 126/62  06/15/23 130/84  12/22/22 124/72   Wt Readings from Last 3 Encounters:  01/25/24 170 lb 9.6 oz (77.4 kg)  06/15/23 170 lb 12.8 oz (77.5 kg)  12/22/22 168 lb 3.2 oz (76.3 kg)    Physical Exam Constitutional:      General: He is not in  acute distress.    Appearance: Normal appearance.  HENT:     Head: Normocephalic.  Cardiovascular:     Rate and Rhythm: Normal rate and regular rhythm.     Heart sounds: Normal heart sounds.  Pulmonary:     Effort: Pulmonary effort is normal.     Breath sounds: Normal breath sounds.  Skin:    General: Skin is warm and dry.  Neurological:     General: No focal deficit present.     Mental Status: He is alert.  Psychiatric:        Mood and Affect: Mood normal.        Behavior: Behavior normal.      Assessment/Plan: Please see individual problem list.  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 Symptoms worsening. He has completed physical therapy for balance issues and been evaluated by [facility] in the past. He uses a walker for support. Requesting referral back to Neurology for new evaluation due to progressing symptoms. Referral placed to [facility].  Orders: -     Ambulatory referral to Neurology  Ataxia Assessment & Plan: He has chronic ataxia with progressive symptoms. Previous physical therapy was ineffective, and his last neurology visit was five years ago. He uses a rollator walker and cart for support. Refer to neurology for evaluation and continue regular exercise regimen.  Orders: -     Ambulatory referral to Neurology  Type 2 diabetes mellitus without complication, with long-term current use of insulin (HCC) Assessment & Plan: His diabetes is well controlled with morning blood sugar levels between 125-160 mg/dL and takes metformin  500 mg twice daily. Last A1c- 6.3. Check hemoglobin A1c today and continue metformin . Encourage healthy diet.   Orders: -     Hemoglobin A1c -     metFORMIN  HCl; Take 1 tablet (500 mg total) by mouth 2 (two) times daily with a meal.  Dispense: 180 tablet; Refill: 3  Primary hypertension Assessment & Plan: His hypertension is well controlled with Amlodipine  2.5 mg daily, with home readings stable.  Continue current antihypertensive regimen and monitor blood pressure at home.  Orders: -     Comprehensive metabolic panel with GFR  Mixed hyperlipidemia Assessment & Plan: His hyperlipidemia is managed with atorvastatin  80 mg daily. Continue atorvastatin .   Orders: -     Atorvastatin  Calcium ; TAKE 1 TABLET BY MOUTH EVERY DAY  Dispense: 90 tablet; Refill: 3    Return in about 6 months (around 07/26/2024) for Follow up.   Bluford Burkitt, NP-C [facility]

## 2024-01-26 ENCOUNTER — Encounter: Payer: Self-pay | Admitting: Nurse Practitioner

## 2024-01-26 LAB — COMPREHENSIVE METABOLIC PANEL WITH GFR
ALT: 32 U/L (ref 0–53)
AST: 23 U/L (ref 0–37)
Albumin: 4.7 g/dL (ref 3.5–5.2)
Alkaline Phosphatase: 106 U/L (ref 39–117)
BUN: 21 mg/dL (ref 6–23)
CO2: 26 meq/L (ref 19–32)
Calcium: 9.4 mg/dL (ref 8.4–10.5)
Chloride: 100 meq/L (ref 96–112)
Creatinine, Ser: 1.26 mg/dL (ref 0.40–1.50)
GFR: 57.4 mL/min — ABNORMAL LOW (ref >60.00)
Glucose, Bld: 103 mg/dL — ABNORMAL HIGH (ref 70–99)
Potassium: 4.4 meq/L (ref 3.5–5.1)
Sodium: 138 meq/L (ref 135–145)
Total Bilirubin: 1.5 mg/dL — ABNORMAL HIGH (ref 0.2–1.2)
Total Protein: 7.5 g/dL (ref 6.0–8.3)

## 2024-01-26 LAB — HEMOGLOBIN A1C: Hgb A1c MFr Bld: 6.4 % (ref 4.6–6.5)

## 2024-02-02 ENCOUNTER — Encounter: Payer: Self-pay | Admitting: Nurse Practitioner

## 2024-02-02 NOTE — Assessment & Plan Note (Signed)
 His hyperlipidemia is managed with atorvastatin  80 mg daily. Continue atorvastatin .

## 2024-02-02 NOTE — Assessment & Plan Note (Signed)
 He has chronic ataxia with progressive symptoms. Previous physical therapy was ineffective, and his last neurology visit was five years ago. He uses a rollator walker and cart for support. Refer to neurology for evaluation and continue regular exercise regimen.

## 2024-02-02 NOTE — Assessment & Plan Note (Signed)
 His hypertension is well controlled with Amlodipine  2.5 mg daily, with home readings stable. Continue current antihypertensive regimen and monitor blood pressure at home.

## 2024-02-02 NOTE — Assessment & Plan Note (Signed)
 His diabetes is well controlled with morning blood sugar levels between 125-160 mg/dL and takes metformin  500 mg twice daily. Last A1c- 6.3. Check hemoglobin A1c today and continue metformin . Encourage healthy diet.

## 2024-02-02 NOTE — Assessment & Plan Note (Signed)
 Chronic issue. Symptoms worsening. He has completed physical therapy for balance issues and been evaluated by [facility] in the past. He uses a walker for support. Requesting referral back to Neurology for new evaluation due to progressing symptoms. Referral placed to [facility].

## 2024-03-01 ENCOUNTER — Other Ambulatory Visit: Payer: Self-pay | Admitting: Nurse Practitioner

## 2024-03-01 DIAGNOSIS — G319 Degenerative disease of nervous system, unspecified: Secondary | ICD-10-CM

## 2024-03-01 DIAGNOSIS — R270 Ataxia, unspecified: Secondary | ICD-10-CM

## 2024-03-27 ENCOUNTER — Other Ambulatory Visit: Payer: Self-pay

## 2024-03-29 ENCOUNTER — Telehealth: Payer: Self-pay

## 2024-03-29 ENCOUNTER — Other Ambulatory Visit: Payer: Self-pay

## 2024-03-29 ENCOUNTER — Other Ambulatory Visit: Payer: Self-pay | Admitting: Nurse Practitioner

## 2024-03-29 ENCOUNTER — Telehealth: Payer: Self-pay | Admitting: Nurse Practitioner

## 2024-03-29 DIAGNOSIS — Z794 Long term (current) use of insulin: Secondary | ICD-10-CM

## 2024-03-29 MED ORDER — ACCU-CHEK GUIDE TEST VI STRP: ORAL_STRIP | 3 refills | Status: DC

## 2024-03-29 NOTE — Telephone Encounter (Signed)
 Medication refill request - new order needs to be place for this medication - see below    Copied from CRM [id number]. Topic: Clinical - Medication Refill >> Mar 29, 2024  1:36 PM Leah C wrote: Medication: glucose blood (ACCU-CHEK GUIDE) test strip   Has the patient contacted their pharmacy? [facility]  (Agent: If no, request that the patient contact the pharmacy for the refill. If patient does not wish to contact the pharmacy document the reason why and proceed with request.) (Agent: If yes, when and what did the pharmacy advise?)   This is the patient's preferred pharmacy:  [id number] [place], [place] - [address] [place] [address] Phone: [phone] Fax: [phone]   Is this the correct pharmacy for this prescription? Yes If no, delete pharmacy and type the correct one.    Has the prescription been filled recently? Yes   Is the patient out of the medication? Yes   Has the patient been seen for an appointment in the last year OR does the patient have an upcoming appointment? Yes   Can we respond through MyChart? Yes   Agent: Please be advised that Rx refills may take up to 3 business days. We ask that you follow-up with your pharmacy.

## 2024-03-29 NOTE — Telephone Encounter (Signed)
 Copied from CRM [id number]. Topic: Clinical - Medication Refill >> Mar 29, 2024  1:36 PM Leah C wrote: Medication: glucose blood (ACCU-CHEK GUIDE) test strip  Has the patient contacted their pharmacy? [facility]  (Agent: If no, request that the patient contact the pharmacy for the refill. If patient does not wish to contact the pharmacy document the reason why and proceed with request.) (Agent: If yes, when and what did the pharmacy advise?)  This is the patient's preferred pharmacy:  [id number] [place], [place] - [address] [place] [address] Phone: [phone] Fax: [phone]  Is this the correct pharmacy for this prescription? Yes If no, delete pharmacy and type the correct one.   Has the prescription been filled recently? Yes  Is the patient out of the medication? Yes  Has the patient been seen for an appointment in the last year OR does the patient have an upcoming appointment? Yes  Can we respond through MyChart? Yes  Agent: Please be advised that Rx refills may take up to 3 business days. We ask that you follow-up with your pharmacy.

## 2024-03-29 NOTE — Telephone Encounter (Signed)
 REFILL SENT

## 2024-03-29 NOTE — Telephone Encounter (Signed)
 Pt sister called about pt's referral and scheduling pt an appointment, transferred to referrals.

## 2024-05-03 DIAGNOSIS — Z961 Presence of intraocular lens: Secondary | ICD-10-CM | POA: Diagnosis not present

## 2024-05-03 DIAGNOSIS — H43813 Vitreous degeneration, bilateral: Secondary | ICD-10-CM | POA: Diagnosis not present

## 2024-05-03 DIAGNOSIS — E119 Type 2 diabetes mellitus without complications: Secondary | ICD-10-CM | POA: Diagnosis not present

## 2024-05-03 LAB — HM DIABETES EYE EXAM

## 2024-06-06 ENCOUNTER — Telehealth: Payer: Self-pay | Admitting: Neurology

## 2024-06-06 ENCOUNTER — Ambulatory Visit (INDEPENDENT_AMBULATORY_CARE_PROVIDER_SITE_OTHER): Admitting: Neurology

## 2024-06-06 ENCOUNTER — Encounter: Payer: Self-pay | Admitting: Neurology

## 2024-06-06 VITALS — BP 146/85 | HR 76 | Ht 67.0 in | Wt 166.0 lb

## 2024-06-06 DIAGNOSIS — R269 Unspecified abnormalities of gait and mobility: Secondary | ICD-10-CM

## 2024-06-06 DIAGNOSIS — H903 Sensorineural hearing loss, bilateral: Secondary | ICD-10-CM | POA: Diagnosis not present
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
## 2024-06-06 NOTE — Telephone Encounter (Signed)
 no auth required sent to [facility] [phone]

## 2024-06-06 NOTE — Addendum Note (Signed)
 Addended by: Genie Wenke on: 06/06/2024 04:53 PM   Modules accepted: Orders

## 2024-07-08 ENCOUNTER — Ambulatory Visit
Admission: RE | Admit: 2024-07-08 | Discharge: 2024-07-08 | Disposition: A | Discharge status: Home / Self Care | Source: Ambulatory Visit | Attending: Neurology | Admitting: Neurology

## 2024-07-08 DIAGNOSIS — R269 Unspecified abnormalities of gait and mobility: Secondary | ICD-10-CM | POA: Diagnosis not present

## 2024-07-10 ENCOUNTER — Ambulatory Visit: Payer: Self-pay | Admitting: Neurology

## 2024-07-26 ENCOUNTER — Ambulatory Visit: Admitting: Nurse Practitioner

## 2024-08-04 ENCOUNTER — Ambulatory Visit: Admitting: Nurse Practitioner

## 2024-08-04 ENCOUNTER — Encounter: Payer: Self-pay | Admitting: Nurse Practitioner

## 2024-08-04 VITALS — BP 118/62 | HR 79 | Temp 97.6°F | Ht 67.0 in | Wt 165.0 lb

## 2024-08-04 DIAGNOSIS — Z1329 Encounter for screening for other suspected endocrine disorder: Secondary | ICD-10-CM | POA: Diagnosis not present

## 2024-08-04 DIAGNOSIS — E119 Type 2 diabetes mellitus without complications: Secondary | ICD-10-CM

## 2024-08-04 DIAGNOSIS — Z125 Encounter for screening for malignant neoplasm of prostate: Secondary | ICD-10-CM

## 2024-08-04 DIAGNOSIS — R270 Ataxia, unspecified: Secondary | ICD-10-CM | POA: Diagnosis not present

## 2024-08-04 DIAGNOSIS — I10 Essential (primary) hypertension: Secondary | ICD-10-CM | POA: Diagnosis not present

## 2024-08-04 DIAGNOSIS — E782 Mixed hyperlipidemia: Secondary | ICD-10-CM

## 2024-08-04 DIAGNOSIS — R269 Unspecified abnormalities of gait and mobility: Secondary | ICD-10-CM

## 2024-08-04 DIAGNOSIS — S81811A Laceration without foreign body, right lower leg, initial encounter: Secondary | ICD-10-CM | POA: Diagnosis not present

## 2024-08-04 DIAGNOSIS — Z794 Long term (current) use of insulin: Secondary | ICD-10-CM | POA: Diagnosis not present

## 2024-08-04 DIAGNOSIS — G319 Degenerative disease of nervous system, unspecified: Secondary | ICD-10-CM

## 2024-08-04 LAB — LIPID PANEL
Cholesterol: 107 mg/dL (ref 0–200)
HDL: 33.8 mg/dL — ABNORMAL LOW (ref >39.00)
LDL Cholesterol: 19 mg/dL (ref 0–99)
NonHDL: 72.94
Total CHOL/HDL Ratio: 3
Triglycerides: 268 mg/dL — ABNORMAL HIGH (ref 0.0–149.0)
VLDL: 53.6 mg/dL — ABNORMAL HIGH (ref 0.0–40.0)

## 2024-08-04 LAB — COMPREHENSIVE METABOLIC PANEL WITH GFR
ALT: 29 U/L (ref 0–53)
AST: 27 U/L (ref 0–37)
Albumin: 4.4 g/dL (ref 3.5–5.2)
Alkaline Phosphatase: 110 U/L (ref 39–117)
BUN: 16 mg/dL (ref 6–23)
CO2: 29 meq/L (ref 19–32)
Calcium: 9.3 mg/dL (ref 8.4–10.5)
Chloride: 100 meq/L (ref 96–112)
Creatinine, Ser: 1.33 mg/dL (ref 0.40–1.50)
GFR: 53.59 mL/min — ABNORMAL LOW (ref >60.00)
Glucose, Bld: 102 mg/dL — ABNORMAL HIGH (ref 70–99)
Potassium: 4.5 meq/L (ref 3.5–5.1)
Sodium: 139 meq/L (ref 135–145)
Total Bilirubin: 1.9 mg/dL — ABNORMAL HIGH (ref 0.2–1.2)
Total Protein: 6.7 g/dL (ref 6.0–8.3)

## 2024-08-04 LAB — TSH: TSH: 1.62 u[IU]/mL (ref 0.35–5.50)

## 2024-08-04 LAB — HEMOGLOBIN A1C: Hgb A1c MFr Bld: 6.1 % (ref 4.6–6.5)

## 2024-08-04 LAB — PSA, MEDICARE: PSA: 0.36 ng/mL (ref 0.10–4.00)

## 2024-08-04 MED ORDER — MUPIROCIN 2 % EX OINT: 1.0000 | TOPICAL_OINTMENT | Freq: Two times a day (BID) | CUTANEOUS | 0 refills | Status: AC

## 2024-08-04 NOTE — Progress Notes (Signed)
 Leron Glance, NP-C Phone: [phone]  Austin Richards is a 73 y.o. male who presents today for follow up.   Skin Tear- Non healing skin tear on right lower leg. Has been there for several months, will heal then re-open. No tenderness. No redness or warmth.   HYPERTENSION Disease Monitoring Home BP Monitoring- Not checking Chest pain- No    Dyspnea- No Medications Compliance-  Norvasc . Lightheadedness-  No  Edema- No BMET    Component Value Date/Time   NA 139 08/04/2024 1134   K 4.5 08/04/2024 1134   CL 100 08/04/2024 1134   CO2 29 08/04/2024 1134   GLUCOSE 102 (H) 08/04/2024 1134   BUN 16 08/04/2024 1134   CREATININE 1.33 08/04/2024 1134   CALCIUM  9.3 08/04/2024 1134   GFRNONAA 51 (L) 10/29/2017 1350   GFRAA 59 (L) 10/29/2017 1350   DIABETES Disease Monitoring: Blood Sugar ranges- Not checking Polyuria/phagia/dipsia- No      Optho- UTD Medications: Compliance- Metformin  Hypoglycemic symptoms- No  Gait Abnormality- Followed by Neurology. Recent MRI without any changes compared to 2019. Age related findings. Referred to physical therapy. Going to the gym 3-4 times per week to increase strength. No new concerns.    Social History   Tobacco Use  Smoking Status Former   Types: Cigarettes  Smokeless Tobacco Never  Tobacco Comments   smoked in my 13s for a short period of time    Current Outpatient Medications on File Prior to Visit  Medication Sig Dispense Refill   ACCU-CHEK GUIDE TEST test strip USE AS INSTRUCTED 100 strip 3   Accu-Chek Softclix Lancets lancets USE AS DIRECTED 100 each 1   acetaminophen  (TYLENOL ) 500 MG tablet Take 500 mg by mouth as needed.     albuterol  (VENTOLIN  HFA) 108 (90 Base) MCG/ACT inhaler TAKE 2 PUFFS BY MOUTH EVERY 6 HOURS AS NEEDED FOR WHEEZE OR SHORTNESS OF BREATH 18 each 1   amLODipine  (NORVASC ) 5 MG tablet TAKE 1/2 TABLET BY MOUTH DAILY 45 tablet 3   APPLE CIDER VINEGAR PO Take by mouth.     atorvastatin  (LIPITOR) 80 MG  tablet TAKE 1 TABLET BY MOUTH EVERY DAY 90 tablet 3   blood glucose meter kit and supplies KIT Dispense based on patient and insurance preference. Use once daily. (FOR ICD-10 E11.9). 1 each 0   glucose blood (ACCU-CHEK GUIDE) test strip 1 each by Other route daily at 12 noon. Use as instructed 100 strip 5   metFORMIN  (GLUCOPHAGE ) 500 MG tablet Take 1 tablet (500 mg total) by mouth 2 (two) times daily with a meal. 180 tablet 3   Multiple Vitamins-Minerals (MULTIVITAMIN PO) Take 1 tablet by mouth daily.     Omega-3 Fatty Acids (FISH OIL PO) Take by mouth.     Protein POWD Take by mouth.     No current facility-administered medications on file prior to visit.     ROS see history of present illness  Objective  Physical Exam Vitals:   08/04/24 1058  BP: 118/62  Pulse: 79  Temp: 97.6 F (36.4 C)  SpO2: 99%    BP Readings from Last 3 Encounters:  08/04/24 118/62  06/06/24 (!) 146/85  01/25/24 126/62   Wt Readings from Last 3 Encounters:  08/04/24 165 lb (74.8 kg)  06/06/24 166 lb (75.3 kg)  01/25/24 170 lb 9.6 oz (77.4 kg)    Physical Exam Constitutional:      General: He is not in acute distress.    Appearance: Normal appearance.  HENT:     Head: Normocephalic.  Cardiovascular:     Rate and Rhythm: Normal rate and regular rhythm.     Heart sounds: Normal heart sounds.  Pulmonary:     Effort: Pulmonary effort is normal.     Breath sounds: Normal breath sounds.  Skin:    General: Skin is warm and dry.     Findings: Abrasion present.     Comments: Small right lower leg. No drainage, erythema, or warmth. No signs of infection.   Neurological:     General: No focal deficit present.     Mental Status: He is alert.  Psychiatric:        Mood and Affect: Mood normal.        Behavior: Behavior normal.      Assessment/Plan: Please see individual problem list.  Skin tear of right lower leg without complication, initial encounter Assessment & Plan: No signs of  infection present. We will trial Bactroban  ointment. Encouraged to keep area clean and dry. Return precautions given to patient.   Orders: -     Mupirocin ; Apply 1 Application topically 2 (two) times daily. To affected area.  Dispense: 22 g; Refill: 0  Type 2 diabetes mellitus without complication, with long-term current use of insulin (HCC) Assessment & Plan: His diabetes is well controlled on metformin  500 mg twice daily. Last A1c- 6.4. Check hemoglobin A1c today and continue metformin . Encourage healthy diet.   Orders: -     Hemoglobin A1c  Gait abnormality Assessment & Plan: Managed by neurology. Recent MRI with no new changes. Referred to PT. Continue strengthening exercises. Follow up with neuro as scheduled.    Ataxia Assessment & Plan: He has chronic ataxia with progressive symptoms. Evaluated by neurology with recent MRI showing no new changes. Referred to PT. Follow up as scheduled.    Primary hypertension Assessment & Plan: His hypertension is well controlled with Amlodipine  2.5 mg daily, with home readings stable. Continue current antihypertensive regimen and monitor blood pressure at home.  Orders: -     Comprehensive metabolic panel with GFR  Mixed hyperlipidemia Assessment & Plan: His hyperlipidemia is managed with atorvastatin  80 mg daily. Continue atorvastatin . Check lipid panel.   Orders: -     Lipid panel  Thyroid  disorder screen -     TSH  Screening PSA (prostate specific antigen) -     PSA, Medicare     Return in about 6 months (around 02/01/2025) for Follow up.   Leron Glance, NP-C [facility]

## 2024-08-09 DIAGNOSIS — Z23 Encounter for immunization: Secondary | ICD-10-CM | POA: Diagnosis not present

## 2024-08-10 ENCOUNTER — Ambulatory Visit: Payer: Self-pay | Admitting: Nurse Practitioner

## 2024-08-21 ENCOUNTER — Encounter: Payer: Self-pay | Admitting: Nurse Practitioner

## 2024-08-21 NOTE — Assessment & Plan Note (Signed)
 He has chronic ataxia with progressive symptoms. Evaluated by neurology with recent MRI showing no new changes. Referred to PT. Follow up as scheduled.

## 2024-08-21 NOTE — Assessment & Plan Note (Signed)
 No signs of infection present. We will trial Bactroban  ointment. Encouraged to keep area clean and dry. Return precautions given to patient.

## 2024-08-21 NOTE — Assessment & Plan Note (Signed)
 Managed by neurology. Recent MRI with no new changes. Referred to PT. Continue strengthening exercises. Follow up with neuro as scheduled.

## 2024-08-21 NOTE — Assessment & Plan Note (Signed)
 His diabetes is well controlled on metformin  500 mg twice daily. Last A1c- 6.4. Check hemoglobin A1c today and continue metformin . Encourage healthy diet.

## 2024-08-21 NOTE — Assessment & Plan Note (Signed)
 His hyperlipidemia is managed with atorvastatin  80 mg daily. Continue atorvastatin . Check lipid panel.

## 2024-08-21 NOTE — Assessment & Plan Note (Signed)
 His hypertension is well controlled with Amlodipine  2.5 mg daily, with home readings stable. Continue current antihypertensive regimen and monitor blood pressure at home.

## 2024-11-03 ENCOUNTER — Other Ambulatory Visit: Payer: Self-pay

## 2024-11-03 MED ORDER — AMLODIPINE BESYLATE 5 MG PO TABS: 2.5000 mg | ORAL_TABLET | Freq: Every day | ORAL | 3 refills | Status: AC

## 2025-02-02 ENCOUNTER — Ambulatory Visit: Admitting: Nurse Practitioner
